# Patient Record
Sex: Female | Born: 1940 | Race: White | Hispanic: No | Smoking: Former smoker
Health system: Southern US, Community
[De-identification: ages and names within clinical notes are randomized; demographics above are authoritative.]

## PROBLEM LIST (undated history)

## (undated) DIAGNOSIS — E876 Hypokalemia: Secondary | ICD-10-CM

## (undated) DIAGNOSIS — F439 Reaction to severe stress, unspecified: Secondary | ICD-10-CM

## (undated) DIAGNOSIS — C50919 Malignant neoplasm of unspecified site of unspecified female breast: Secondary | ICD-10-CM

## (undated) DIAGNOSIS — E785 Hyperlipidemia, unspecified: Secondary | ICD-10-CM

## (undated) DIAGNOSIS — E559 Vitamin D deficiency, unspecified: Secondary | ICD-10-CM

## (undated) DIAGNOSIS — Z923 Personal history of irradiation: Secondary | ICD-10-CM

## (undated) DIAGNOSIS — Z9221 Personal history of antineoplastic chemotherapy: Secondary | ICD-10-CM

## (undated) DIAGNOSIS — M810 Age-related osteoporosis without current pathological fracture: Secondary | ICD-10-CM

## (undated) DIAGNOSIS — Z853 Personal history of malignant neoplasm of breast: Secondary | ICD-10-CM

## (undated) DIAGNOSIS — N951 Menopausal and female climacteric states: Secondary | ICD-10-CM

## (undated) DIAGNOSIS — S0990XA Unspecified injury of head, initial encounter: Secondary | ICD-10-CM

## (undated) DIAGNOSIS — H269 Unspecified cataract: Secondary | ICD-10-CM

## (undated) DIAGNOSIS — R609 Edema, unspecified: Secondary | ICD-10-CM

## (undated) DIAGNOSIS — B029 Zoster without complications: Secondary | ICD-10-CM

## (undated) DIAGNOSIS — T7840XA Allergy, unspecified, initial encounter: Secondary | ICD-10-CM

## (undated) DIAGNOSIS — I10 Essential (primary) hypertension: Secondary | ICD-10-CM

## (undated) DIAGNOSIS — C801 Malignant (primary) neoplasm, unspecified: Secondary | ICD-10-CM

## (undated) DIAGNOSIS — Z8679 Personal history of other diseases of the circulatory system: Secondary | ICD-10-CM

## (undated) DIAGNOSIS — G4700 Insomnia, unspecified: Secondary | ICD-10-CM

## (undated) DIAGNOSIS — E8881 Metabolic syndrome: Secondary | ICD-10-CM

## (undated) DIAGNOSIS — I639 Cerebral infarction, unspecified: Secondary | ICD-10-CM

## (undated) HISTORY — DX: Unspecified cataract: H26.9

## (undated) HISTORY — DX: Hypokalemia: E87.6

## (undated) HISTORY — DX: Essential (primary) hypertension: I10

## (undated) HISTORY — DX: Personal history of other diseases of the circulatory system: Z86.79

## (undated) HISTORY — DX: Vitamin D deficiency, unspecified: E55.9

## (undated) HISTORY — DX: Edema, unspecified: R60.9

## (undated) HISTORY — DX: Metabolic syndrome: E88.81

## (undated) HISTORY — DX: Hyperlipidemia, unspecified: E78.5

## (undated) HISTORY — DX: Personal history of malignant neoplasm of breast: Z85.3

## (undated) HISTORY — DX: Metabolic syndrome: E88.810

## (undated) HISTORY — PX: CATARACT EXTRACTION, BILATERAL: SHX1313

## (undated) HISTORY — DX: Age-related osteoporosis without current pathological fracture: M81.0

## (undated) HISTORY — DX: Insomnia, unspecified: G47.00

## (undated) HISTORY — DX: Malignant (primary) neoplasm, unspecified: C80.1

## (undated) HISTORY — DX: Zoster without complications: B02.9

## (undated) HISTORY — DX: Unspecified injury of head, initial encounter: S09.90XA

## (undated) HISTORY — DX: Menopausal and female climacteric states: N95.1

## (undated) HISTORY — DX: Reaction to severe stress, unspecified: F43.9

## (undated) HISTORY — DX: Personal history of irradiation: Z92.3

## (undated) HISTORY — PX: OTHER SURGICAL HISTORY: SHX169

## (undated) HISTORY — DX: Allergy, unspecified, initial encounter: T78.40XA

## (undated) HISTORY — DX: Personal history of antineoplastic chemotherapy: Z92.21

## (undated) HISTORY — PX: ABDOMINAL HYSTERECTOMY: SHX81

---

## 1989-11-01 HISTORY — PX: BREAST EXCISIONAL BIOPSY: SUR124

## 1993-11-01 DIAGNOSIS — S0990XA Unspecified injury of head, initial encounter: Secondary | ICD-10-CM

## 1993-11-01 HISTORY — DX: Unspecified injury of head, initial encounter: S09.90XA

## 2004-02-23 ENCOUNTER — Other Ambulatory Visit: Payer: Self-pay

## 2004-11-01 DIAGNOSIS — Z9221 Personal history of antineoplastic chemotherapy: Secondary | ICD-10-CM

## 2004-11-01 DIAGNOSIS — Z923 Personal history of irradiation: Secondary | ICD-10-CM

## 2004-11-01 DIAGNOSIS — C801 Malignant (primary) neoplasm, unspecified: Secondary | ICD-10-CM

## 2004-11-01 DIAGNOSIS — B029 Zoster without complications: Secondary | ICD-10-CM

## 2004-11-01 HISTORY — DX: Zoster without complications: B02.9

## 2004-11-01 HISTORY — PX: BREAST BIOPSY: SHX20

## 2004-11-01 HISTORY — DX: Personal history of irradiation: Z92.3

## 2004-11-01 HISTORY — DX: Personal history of antineoplastic chemotherapy: Z92.21

## 2004-11-01 HISTORY — PX: PORTACATH PLACEMENT: SHX2246

## 2004-11-01 HISTORY — DX: Malignant (primary) neoplasm, unspecified: C80.1

## 2004-11-01 HISTORY — PX: BREAST SURGERY: SHX581

## 2005-02-01 ENCOUNTER — Ambulatory Visit: Payer: Self-pay

## 2005-06-21 ENCOUNTER — Ambulatory Visit: Payer: Self-pay

## 2005-07-06 ENCOUNTER — Ambulatory Visit: Payer: Self-pay

## 2005-07-26 ENCOUNTER — Ambulatory Visit: Payer: Self-pay | Admitting: General Surgery

## 2005-09-01 HISTORY — PX: BREAST LUMPECTOMY: SHX2

## 2005-09-13 ENCOUNTER — Ambulatory Visit: Payer: Self-pay | Admitting: General Surgery

## 2005-09-13 ENCOUNTER — Other Ambulatory Visit: Payer: Self-pay

## 2005-09-14 ENCOUNTER — Ambulatory Visit: Payer: Self-pay | Admitting: General Surgery

## 2005-09-16 ENCOUNTER — Ambulatory Visit: Payer: Self-pay | Admitting: General Surgery

## 2005-09-16 DIAGNOSIS — C50919 Malignant neoplasm of unspecified site of unspecified female breast: Secondary | ICD-10-CM

## 2005-09-16 HISTORY — DX: Malignant neoplasm of unspecified site of unspecified female breast: C50.919

## 2005-10-07 ENCOUNTER — Ambulatory Visit: Payer: Self-pay | Admitting: Internal Medicine

## 2005-10-29 ENCOUNTER — Ambulatory Visit: Payer: Self-pay | Admitting: General Surgery

## 2005-11-01 ENCOUNTER — Ambulatory Visit: Payer: Self-pay | Admitting: Internal Medicine

## 2005-11-02 ENCOUNTER — Ambulatory Visit: Payer: Self-pay | Admitting: General Surgery

## 2005-12-02 ENCOUNTER — Ambulatory Visit: Payer: Self-pay | Admitting: Internal Medicine

## 2005-12-30 ENCOUNTER — Ambulatory Visit: Payer: Self-pay | Admitting: Internal Medicine

## 2006-01-30 ENCOUNTER — Ambulatory Visit: Payer: Self-pay | Admitting: Internal Medicine

## 2006-03-01 ENCOUNTER — Ambulatory Visit: Payer: Self-pay | Admitting: Internal Medicine

## 2006-04-01 ENCOUNTER — Ambulatory Visit: Payer: Self-pay | Admitting: Internal Medicine

## 2006-05-01 ENCOUNTER — Ambulatory Visit: Payer: Self-pay | Admitting: Internal Medicine

## 2006-06-01 ENCOUNTER — Ambulatory Visit: Payer: Self-pay | Admitting: Internal Medicine

## 2006-07-02 ENCOUNTER — Ambulatory Visit: Payer: Self-pay | Admitting: Internal Medicine

## 2006-08-01 ENCOUNTER — Ambulatory Visit: Payer: Self-pay | Admitting: Internal Medicine

## 2006-09-15 ENCOUNTER — Ambulatory Visit: Payer: Self-pay | Admitting: Internal Medicine

## 2006-10-01 ENCOUNTER — Ambulatory Visit: Payer: Self-pay | Admitting: Internal Medicine

## 2006-11-01 ENCOUNTER — Ambulatory Visit: Payer: Self-pay | Admitting: Internal Medicine

## 2006-12-02 ENCOUNTER — Ambulatory Visit: Payer: Self-pay | Admitting: Internal Medicine

## 2006-12-31 ENCOUNTER — Ambulatory Visit: Payer: Self-pay | Admitting: Internal Medicine

## 2007-01-12 ENCOUNTER — Ambulatory Visit: Payer: Self-pay | Admitting: Radiation Oncology

## 2007-01-31 ENCOUNTER — Ambulatory Visit: Payer: Self-pay | Admitting: Radiation Oncology

## 2007-01-31 ENCOUNTER — Ambulatory Visit: Payer: Self-pay | Admitting: Internal Medicine

## 2007-03-02 ENCOUNTER — Ambulatory Visit: Payer: Self-pay | Admitting: Internal Medicine

## 2007-03-25 IMAGING — US ULTRASOUND LEFT BREAST
1 series · 17 of 25 positions shown · non-contrast
Comparison: none

REASON FOR EXAM: A 2 cm.mass LEFT breast
COMMENTS:

PROCEDURE:     US  - US BREAST LEFT  - July 06, 2005 [DATE]
RESULT:     There is a 1.8 cm, ill-defined, solid mass at 7 o'clock in the
LEFT breast.  This corresponds to the mammographic abnormality and is very
suspicious for malignancy.  A biopsy of this lesion is suggested.

[Series 1: ultrasound left breast · 17 of 34 slices shown]
[im 1/34]
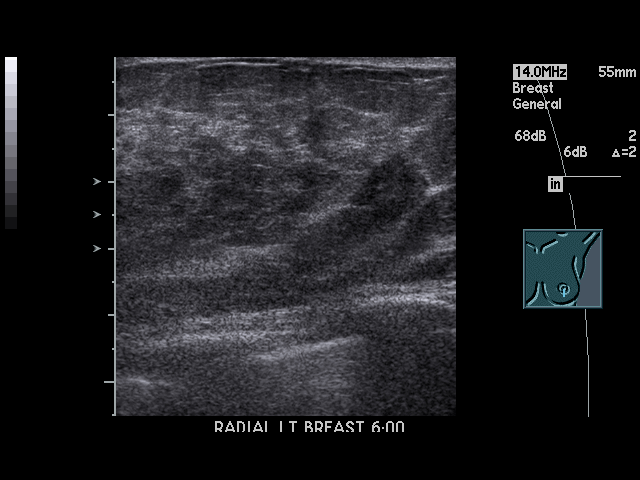
[im 3/34]
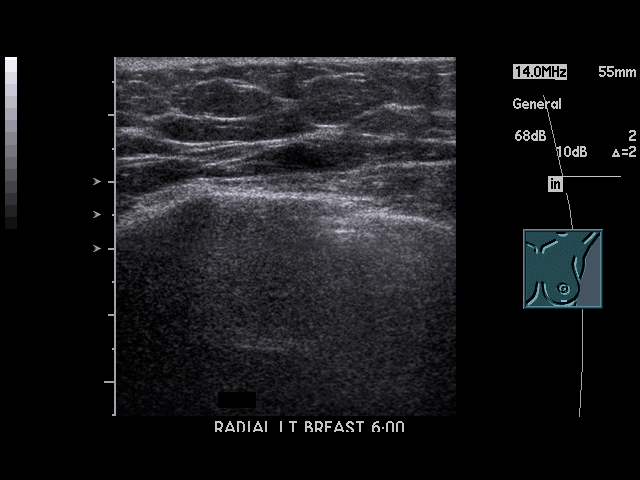
[im 5/34]
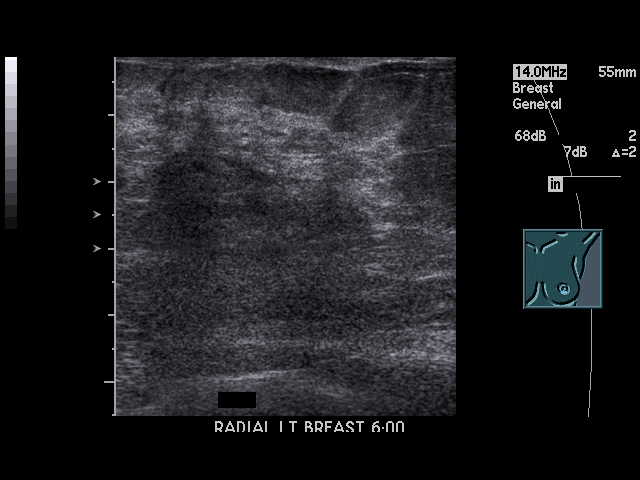
[im 7/34]
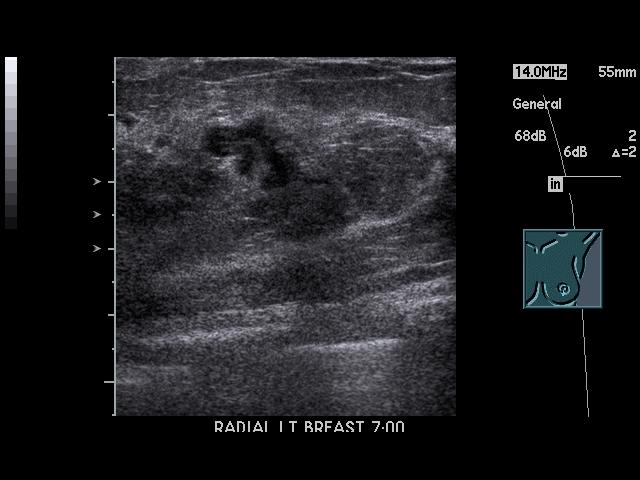
[im 9/34]
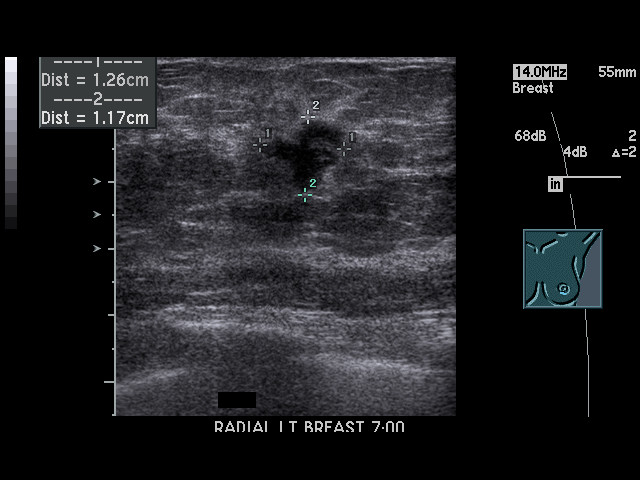
[im 12/34]
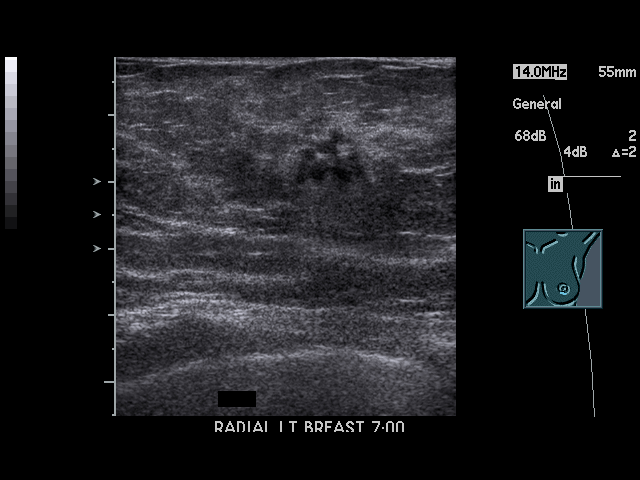
[im 13/34]
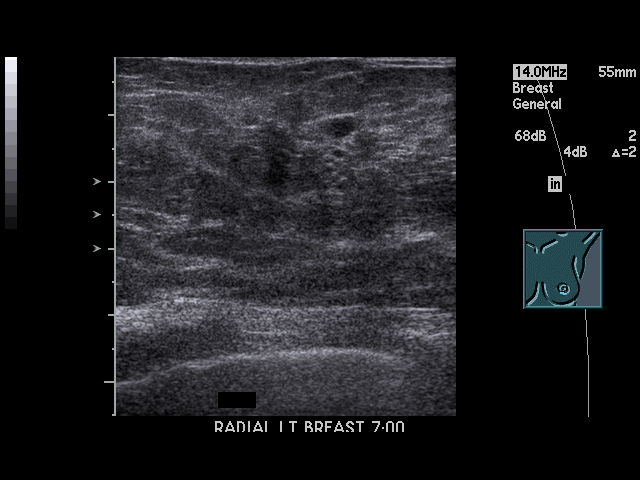
[im 16/34]
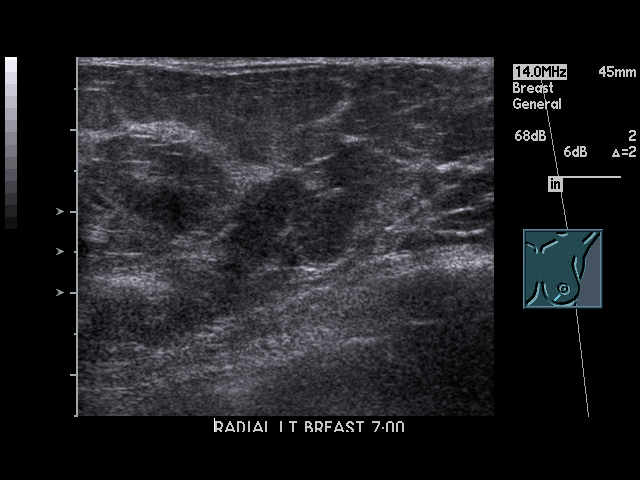
[im 17/34]
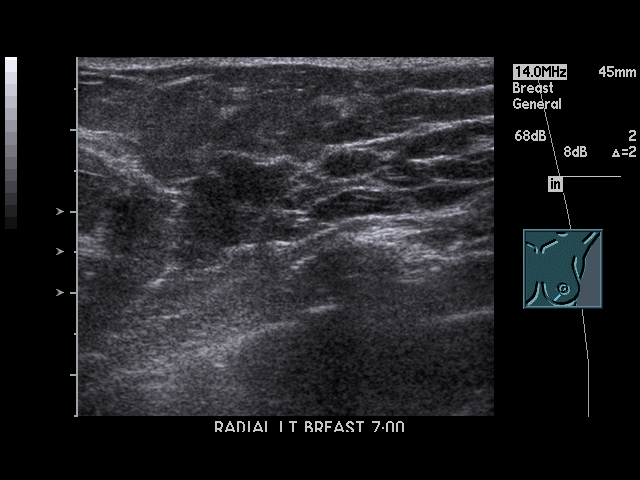
[im 18/34]
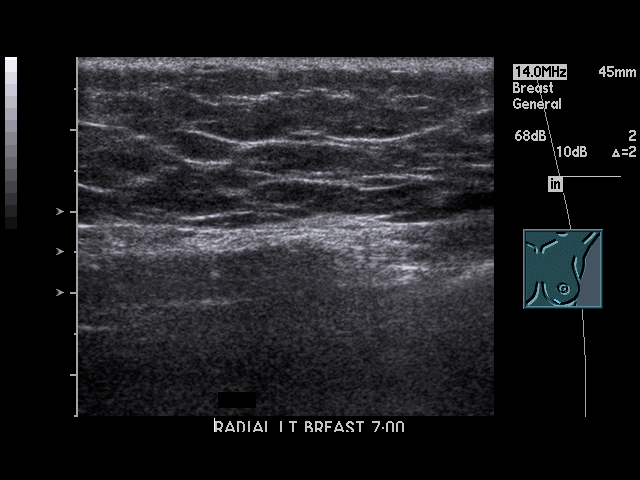
[im 21/34]
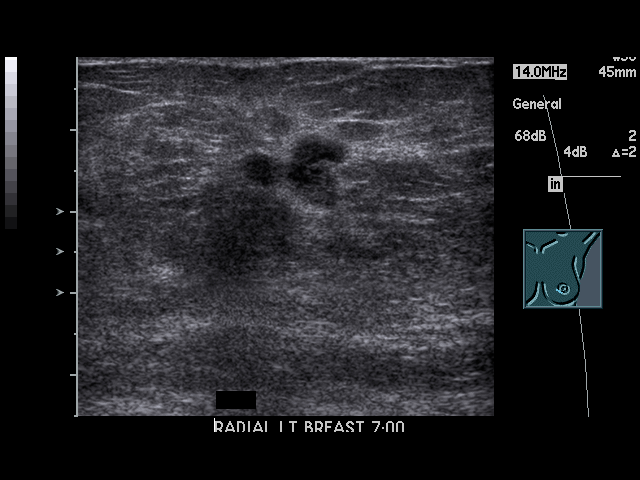
[im 23/34]
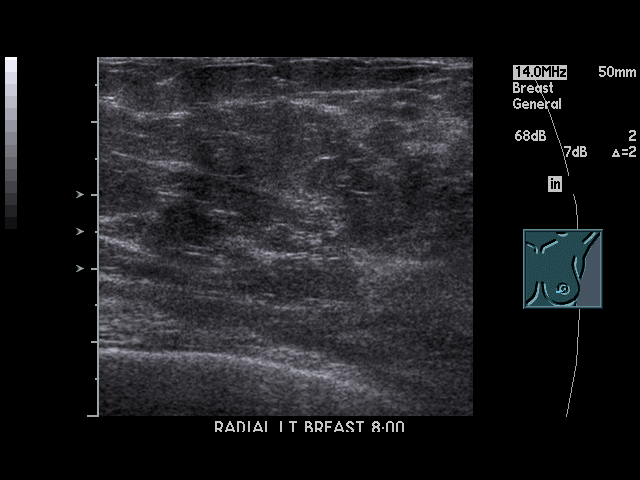
[im 25/34]
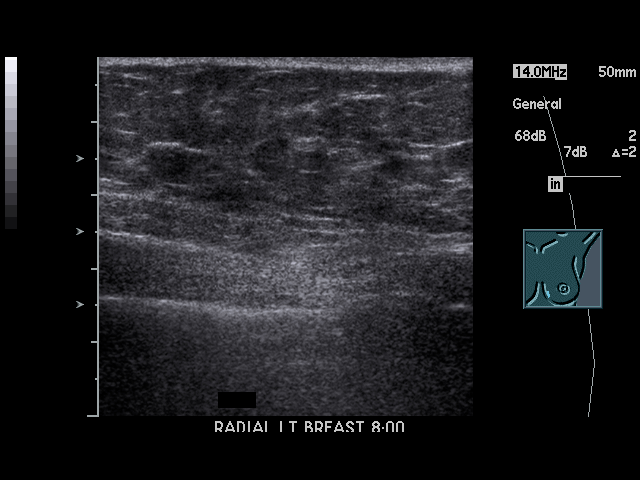
[im 27/34]
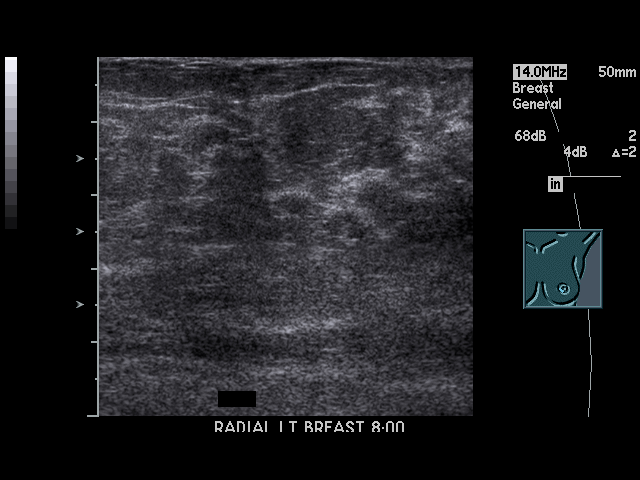
[im 29/34]
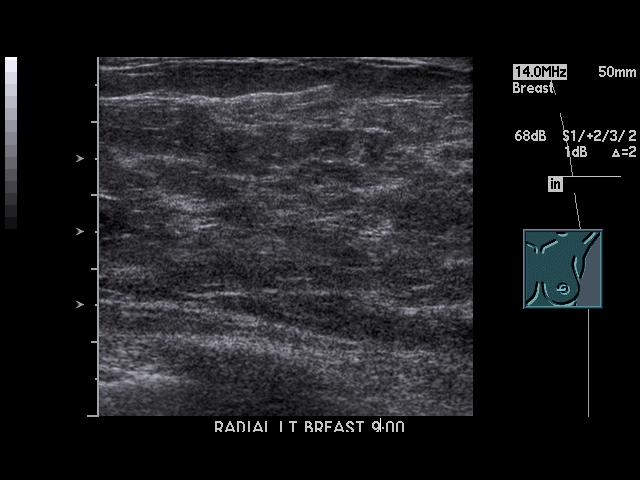
[im 31/34]
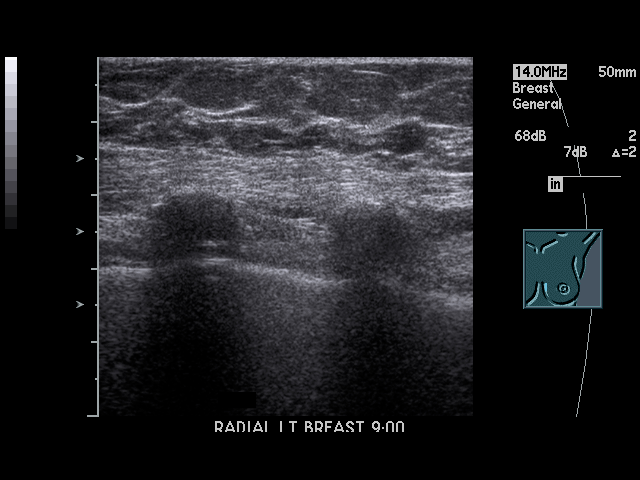
[im 34/34]
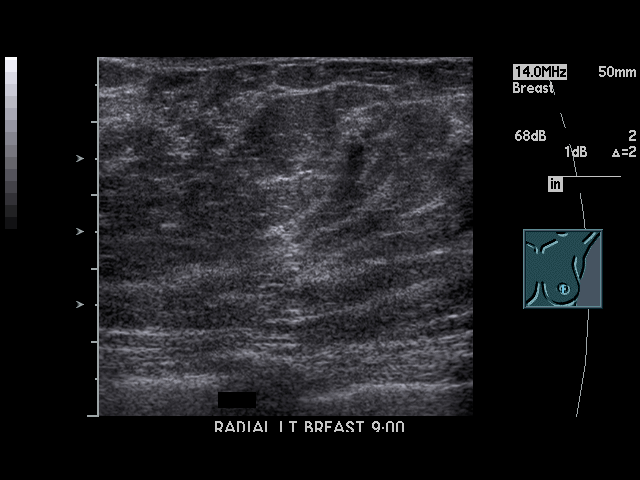

[17 of 25 positions shown; findings below may reference images not displayed]

IMPRESSION: A very suspicious, irregular, solid lesion in the
inferior aspect of the LEFT breast corresponding to the mammographic
abnormality.  This lesion is very suspicious for malignancy, and biopsy is
suggested.

BI-RADS: Category 4-Suspicious Abnormality.

## 2007-04-02 ENCOUNTER — Ambulatory Visit: Payer: Self-pay | Admitting: Internal Medicine

## 2007-05-02 ENCOUNTER — Ambulatory Visit: Payer: Self-pay | Admitting: Internal Medicine

## 2007-06-02 ENCOUNTER — Ambulatory Visit: Payer: Self-pay | Admitting: Internal Medicine

## 2007-06-02 ENCOUNTER — Ambulatory Visit: Payer: Self-pay | Admitting: Radiation Oncology

## 2007-07-03 ENCOUNTER — Ambulatory Visit: Payer: Self-pay | Admitting: Radiation Oncology

## 2007-07-03 ENCOUNTER — Ambulatory Visit: Payer: Self-pay | Admitting: Internal Medicine

## 2007-08-02 ENCOUNTER — Ambulatory Visit: Payer: Self-pay | Admitting: Internal Medicine

## 2007-09-02 ENCOUNTER — Ambulatory Visit: Payer: Self-pay | Admitting: Internal Medicine

## 2007-10-02 ENCOUNTER — Ambulatory Visit: Payer: Self-pay | Admitting: Internal Medicine

## 2007-10-11 ENCOUNTER — Ambulatory Visit: Payer: Self-pay | Admitting: General Surgery

## 2007-11-02 ENCOUNTER — Ambulatory Visit: Payer: Self-pay | Admitting: Internal Medicine

## 2007-11-02 HISTORY — PX: PORT-A-CATH REMOVAL: SHX5289

## 2008-01-31 ENCOUNTER — Ambulatory Visit: Payer: Self-pay | Admitting: Internal Medicine

## 2008-02-20 ENCOUNTER — Ambulatory Visit: Payer: Self-pay | Admitting: Internal Medicine

## 2008-03-01 ENCOUNTER — Ambulatory Visit: Payer: Self-pay | Admitting: Internal Medicine

## 2008-04-01 ENCOUNTER — Ambulatory Visit: Payer: Self-pay | Admitting: Internal Medicine

## 2008-04-08 ENCOUNTER — Ambulatory Visit: Payer: Self-pay | Admitting: General Surgery

## 2008-06-13 ENCOUNTER — Ambulatory Visit: Payer: Self-pay | Admitting: Radiation Oncology

## 2008-06-13 ENCOUNTER — Ambulatory Visit: Payer: Self-pay | Admitting: Internal Medicine

## 2008-07-02 ENCOUNTER — Ambulatory Visit: Payer: Self-pay | Admitting: Radiation Oncology

## 2008-08-01 ENCOUNTER — Ambulatory Visit: Payer: Self-pay | Admitting: Internal Medicine

## 2008-08-21 ENCOUNTER — Ambulatory Visit: Payer: Self-pay | Admitting: Internal Medicine

## 2008-09-01 ENCOUNTER — Ambulatory Visit: Payer: Self-pay | Admitting: Internal Medicine

## 2008-09-09 ENCOUNTER — Ambulatory Visit: Payer: Self-pay | Admitting: General Surgery

## 2009-01-30 ENCOUNTER — Ambulatory Visit: Payer: Self-pay | Admitting: Internal Medicine

## 2009-02-17 ENCOUNTER — Ambulatory Visit: Payer: Self-pay | Admitting: Internal Medicine

## 2009-03-01 ENCOUNTER — Ambulatory Visit: Payer: Self-pay | Admitting: Internal Medicine

## 2009-04-18 ENCOUNTER — Ambulatory Visit: Payer: Self-pay | Admitting: Internal Medicine

## 2009-05-01 ENCOUNTER — Ambulatory Visit: Payer: Self-pay | Admitting: Internal Medicine

## 2009-06-01 ENCOUNTER — Ambulatory Visit: Payer: Self-pay | Admitting: Radiation Oncology

## 2009-06-12 ENCOUNTER — Ambulatory Visit: Payer: Self-pay | Admitting: Internal Medicine

## 2009-07-02 ENCOUNTER — Ambulatory Visit: Payer: Self-pay | Admitting: Radiation Oncology

## 2009-08-01 ENCOUNTER — Ambulatory Visit: Payer: Self-pay | Admitting: Internal Medicine

## 2009-08-18 ENCOUNTER — Ambulatory Visit: Payer: Self-pay | Admitting: Internal Medicine

## 2009-09-01 ENCOUNTER — Ambulatory Visit: Payer: Self-pay | Admitting: Internal Medicine

## 2009-09-15 ENCOUNTER — Ambulatory Visit: Payer: Self-pay | Admitting: General Surgery

## 2010-05-01 ENCOUNTER — Ambulatory Visit: Payer: Self-pay | Admitting: Internal Medicine

## 2010-05-11 ENCOUNTER — Ambulatory Visit: Payer: Self-pay | Admitting: Internal Medicine

## 2010-05-12 LAB — CANCER ANTIGEN 27.29: CA 27.29: 15.7 U/mL (ref 0.0–38.6)

## 2010-06-01 ENCOUNTER — Ambulatory Visit: Payer: Self-pay | Admitting: Internal Medicine

## 2010-07-02 ENCOUNTER — Ambulatory Visit: Payer: Self-pay | Admitting: Internal Medicine

## 2010-09-17 ENCOUNTER — Ambulatory Visit: Payer: Self-pay | Admitting: Internal Medicine

## 2011-05-14 ENCOUNTER — Ambulatory Visit: Payer: Self-pay | Admitting: Internal Medicine

## 2011-06-02 ENCOUNTER — Ambulatory Visit: Payer: Self-pay | Admitting: Internal Medicine

## 2011-08-12 ENCOUNTER — Ambulatory Visit: Payer: Self-pay | Admitting: Internal Medicine

## 2011-09-02 ENCOUNTER — Ambulatory Visit: Payer: Self-pay | Admitting: Internal Medicine

## 2011-10-02 ENCOUNTER — Ambulatory Visit: Payer: Self-pay | Admitting: Internal Medicine

## 2012-05-24 ENCOUNTER — Ambulatory Visit: Payer: Self-pay | Admitting: Internal Medicine

## 2012-05-24 LAB — CBC CANCER CENTER
Basophil #: 0.1 x10 3/mm (ref 0.0–0.1)
Eosinophil #: 0.1 x10 3/mm (ref 0.0–0.7)
HCT: 42.1 % (ref 35.0–47.0)
Lymphocyte %: 33.2 %
MCHC: 33.1 g/dL (ref 32.0–36.0)
Monocyte %: 7.7 %
Neutrophil #: 4.1 x10 3/mm (ref 1.4–6.5)
Neutrophil %: 56.8 %
Platelet: 165 x10 3/mm (ref 150–440)
RBC: 4.22 10*6/uL (ref 3.80–5.20)
RDW: 12 % (ref 11.5–14.5)
WBC: 7.2 x10 3/mm (ref 3.6–11.0)

## 2012-05-24 LAB — HEPATIC FUNCTION PANEL A (ARMC)
Alkaline Phosphatase: 133 U/L (ref 50–136)
Bilirubin,Total: 0.4 mg/dL (ref 0.2–1.0)
SGOT(AST): 31 U/L (ref 15–37)
SGPT (ALT): 39 U/L
Total Protein: 7.6 g/dL (ref 6.4–8.2)

## 2012-05-24 LAB — CREATININE, SERUM: EGFR (Non-African Amer.): 56 — ABNORMAL LOW

## 2012-05-25 LAB — CANCER ANTIGEN 27.29: CA 27.29: 19.1 U/mL (ref 0.0–38.6)

## 2012-06-01 ENCOUNTER — Ambulatory Visit: Payer: Self-pay | Admitting: Internal Medicine

## 2012-09-21 ENCOUNTER — Ambulatory Visit: Payer: Self-pay | Admitting: Internal Medicine

## 2013-06-01 ENCOUNTER — Ambulatory Visit: Payer: Self-pay | Admitting: Internal Medicine

## 2013-06-01 LAB — CBC CANCER CENTER
Basophil %: 0.8 %
Eosinophil %: 2 %
HCT: 37.2 % (ref 35.0–47.0)
HGB: 13.3 g/dL (ref 12.0–16.0)
Lymphocyte %: 36 %
MCHC: 35.8 g/dL (ref 32.0–36.0)
MCV: 96 fL (ref 80–100)
Monocyte #: 0.7 x10 3/mm (ref 0.2–0.9)
Neutrophil #: 4 x10 3/mm (ref 1.4–6.5)
Platelet: 140 x10 3/mm — ABNORMAL LOW (ref 150–440)
RBC: 3.88 10*6/uL (ref 3.80–5.20)
RDW: 12.8 % (ref 11.5–14.5)
WBC: 7.6 x10 3/mm (ref 3.6–11.0)

## 2013-06-01 LAB — CREATININE, SERUM
Creatinine: 0.9 mg/dL (ref 0.60–1.30)
EGFR (Non-African Amer.): >60

## 2013-06-11 ENCOUNTER — Ambulatory Visit: Payer: Self-pay | Admitting: Family Medicine

## 2013-06-11 LAB — HM DEXA SCAN

## 2013-07-02 ENCOUNTER — Ambulatory Visit: Payer: Self-pay | Admitting: Internal Medicine

## 2013-09-25 ENCOUNTER — Ambulatory Visit: Payer: Self-pay | Admitting: Internal Medicine

## 2014-06-17 ENCOUNTER — Ambulatory Visit: Payer: Self-pay | Admitting: Internal Medicine

## 2014-06-17 LAB — HEPATIC FUNCTION PANEL A (ARMC)
ALT: 33 U/L
Albumin: 4 g/dL (ref 3.4–5.0)
Alkaline Phosphatase: 106 U/L
Bilirubin, Direct: <0.1 mg/dL (ref 0.00–0.20)
Bilirubin,Total: 0.4 mg/dL (ref 0.2–1.0)
SGOT(AST): 28 U/L (ref 15–37)
TOTAL PROTEIN: 7 g/dL (ref 6.4–8.2)

## 2014-06-17 LAB — CBC CANCER CENTER
BASOS PCT: 0.8 %
Basophil #: 0.1 x10 3/mm (ref 0.0–0.1)
Eosinophil #: 0.1 x10 3/mm (ref 0.0–0.7)
Eosinophil %: 0.9 %
HCT: 41 % (ref 35.0–47.0)
HGB: 13.5 g/dL (ref 12.0–16.0)
LYMPHS ABS: 2 x10 3/mm (ref 1.0–3.6)
Lymphocyte %: 25.8 %
MCH: 33.1 pg (ref 26.0–34.0)
MCHC: 33 g/dL (ref 32.0–36.0)
MCV: 100 fL (ref 80–100)
Monocyte #: 0.6 x10 3/mm (ref 0.2–0.9)
Monocyte %: 8 %
NEUTROS ABS: 4.9 x10 3/mm (ref 1.4–6.5)
Neutrophil %: 64.5 %
PLATELETS: 149 x10 3/mm — AB (ref 150–440)
RBC: 4.09 10*6/uL (ref 3.80–5.20)
RDW: 12.7 % (ref 11.5–14.5)
WBC: 7.6 x10 3/mm (ref 3.6–11.0)

## 2014-06-17 LAB — CREATININE, SERUM
Creatinine: 0.97 mg/dL (ref 0.60–1.30)
EGFR (African American): >60
EGFR (Non-African Amer.): 58 — ABNORMAL LOW

## 2014-06-18 LAB — CANCER ANTIGEN 27.29: CA 27.29: 20.9 U/mL (ref 0.0–38.6)

## 2014-06-19 LAB — HEMOGLOBIN A1C: HEMOGLOBIN A1C: 6.1 % — AB (ref 4.0–6.0)

## 2014-06-19 LAB — LIPID PANEL
Cholesterol: 140 mg/dL (ref 0–200)
HDL: 57 mg/dL (ref 35–70)
LDL Cholesterol: 54 mg/dL
Triglycerides: 145 mg/dL (ref 40–160)

## 2014-07-02 ENCOUNTER — Ambulatory Visit: Payer: Self-pay | Admitting: Internal Medicine

## 2014-08-12 LAB — HM COLONOSCOPY

## 2014-10-07 ENCOUNTER — Ambulatory Visit: Payer: Self-pay | Admitting: Internal Medicine

## 2014-10-07 LAB — HM MAMMOGRAPHY: HM MAMMO: NEGATIVE

## 2014-10-14 ENCOUNTER — Ambulatory Visit: Payer: Self-pay | Admitting: Internal Medicine

## 2014-10-15 ENCOUNTER — Encounter: Payer: Self-pay | Admitting: General Surgery

## 2014-10-15 ENCOUNTER — Ambulatory Visit: Payer: Self-pay | Admitting: General Surgery

## 2014-10-21 ENCOUNTER — Other Ambulatory Visit: Payer: Commercial Managed Care - HMO

## 2014-10-21 ENCOUNTER — Encounter: Payer: Self-pay | Admitting: General Surgery

## 2014-10-21 ENCOUNTER — Ambulatory Visit (INDEPENDENT_AMBULATORY_CARE_PROVIDER_SITE_OTHER): Payer: Commercial Managed Care - HMO | Admitting: General Surgery

## 2014-10-21 VITALS — BP 110/60 | HR 72 | Resp 12 | Ht 63.5 in | Wt 155.0 lb

## 2014-10-21 DIAGNOSIS — R928 Other abnormal and inconclusive findings on diagnostic imaging of breast: Secondary | ICD-10-CM

## 2014-10-21 NOTE — Patient Instructions (Signed)
Patient to return in 1 week to see nurse. The patient is aware to call back for any questions or concerns.     CARE AFTER BREAST BIOPSY  1. Leave the dressing on that your doctor applied after surgery. It is waterproof. You may bathe, shower and/or swim. The dressing will probably remain intact until your return office visit. If the dressing comes off, you will see small strips of tape against your skin on the incision. Do not remove these strips.  2. You may want to use a gauze,cloth or similar protection in your bra to prevent rubbing against your dressing and incision. This is not necessary, but you may feel more comfortable doing so.  3. It is recommended that you wear a bra day and night to give support to the breast. This will prevent the weight of the breast from pulling on the incision.  4. Your breast will feel hard and lumpy under the incision. Do not be alarmed. This is the underlying stitching of tissue. Softening of this tissue will occur in time.  5. Make sure you call the office and schedule an appointment in one week after your surgery. The office phone number is [phone]. The nurses at [facility] may have already done this for you.  6. You will notice about a week after your office visit that the strips of the tape on your incision will begin to loosen. These may then be removed.  7. Report to your doctor any of the following:  * Severe pain not relieved by your pain medication  *Redness of the incision  * Drainage from the incision  *Fever greater than 101 degrees

## 2014-10-21 NOTE — Progress Notes (Signed)
Patient ID: Leslie Patel, female   DOB: 12-Oct-1941, 73 y.o.   MRN: [id number]  Chief Complaint  Patient presents with  . Other    mammogram    HPI Leslie Patel is a 73 y.o. female who presents for a breast evaluation. The most recent mammogram was done on 10/07/14. Additional views and right breast mammogram were done on 10/14/14.  Patient does perform regular self breast checks and gets regular mammograms done. The patient has a history of left breast breast cancer diagnosed and treated in 2006. The patient denies any problems with her breasts at this time.    HPI  Past Medical History  Diagnosis Date  . Shingles 2006  . Low blood potassium   . Head injury 1995  . History of radiation therapy 2006  . History of chemotherapy 2006  . Cancer 2006    left breast    Past Surgical History  Procedure Laterality Date  . Port-a-cath removal  11/2007  . Breast biopsy Left 1991,2006  . Breast surgery Left 2006    lumpectomy  . Portacath placement  2006    Family History  Problem Relation Age of Onset  . Tuberculosis Mother   . Cancer Father     leukemia  . Other Brother     spinal meningitis    Social History History  Substance Use Topics  . Smoking status: Former Research scientist (life sciences)  . Smokeless tobacco: Never Used  . Alcohol Use: No    Allergies  Allergen Reactions  . Darvon [Propoxyphene] Anaphylaxis  . Loratadine Anaphylaxis  . Percocet [Oxycodone-Acetaminophen] Itching, Rash and Other (See Comments)    wheezing    Current Outpatient Prescriptions  Medication Sig Dispense Refill  . furosemide (LASIX) 20 MG tablet Take 20 mg by mouth daily.    . potassium chloride SA (K-DUR,KLOR-CON) 20 MEQ tablet Take 20 mEq by mouth 4 (four) times daily.    . simvastatin (ZOCOR) 40 MG tablet Take 40 mg by mouth daily.    Marland Kitchen spironolactone (ALDACTONE) 100 MG tablet Take 100 mg by mouth daily.     No current facility-administered medications for this visit.    Review of  Systems Review of Systems  Blood pressure 110/60, pulse 72, resp. rate 12, height 5' 3.5" (1.613 m), weight 155 lb (70.308 kg).  Physical Exam Physical Exam  Constitutional: She is oriented to person, place, and time. She appears well-developed and well-nourished.  Neck: Neck supple. No thyromegaly present.  Cardiovascular: Normal rate, regular rhythm and normal heart sounds.   No murmur heard. Pulmonary/Chest: Effort normal and breath sounds normal. Right breast exhibits no inverted nipple, no mass, no nipple discharge, no skin change and no tenderness. Left breast exhibits no inverted nipple, no mass, no nipple discharge, no skin change and no tenderness.  Well healed incision 6 o'clock left breast.    Lymphadenopathy:    She has no cervical adenopathy.    She has no axillary adenopathy.  Neurological: She is alert and oriented to person, place, and time.  Skin: Skin is warm and dry.    Data Reviewed Screening mammogram dated 10/07/2014 suggested a new density in the inferior medial aspect of the right breast. BI-RADS-0.  Focal spot compression views dated 10/14/2014 showed a persistent abnormality with ultrasound showing a 3 x 7 x 9 mm area within the right breast at the 7:00 position 3 cm from the nipple. RN-4.  Ultrasound examination of the right breast at the 6:00 position (without arm extended  over the shoulder), 3 cm from the nipple showed a 0.3 x 0.7 x 0.7 cm slightly lenticular shaped hypoechoic area. This corresponded to the [facility] study. BI-RADS-3.  The patient was amenable to biopsy.  10 mL of 0.5% Xylocaine with 0.25% Marcaine with 1-200,000 units epinephrine was visualized well tolerated. ChloraPrep was applied to the skin. A 14-gauge spring-loaded device was used and multiple core samples obtained. A postbiopsy clip placed. Skin defect closed with benzoin and Steri-Strips followed by Telfa and Tegaderm dressing.  The procedure was well tolerated.  Assessment    New  right breast mass.    Plan    The patient will be contacted with pathology is available. Assuming benign results arrangements were made for nursing recheck in 1 week.     PCP:  Maryanna Shape 10/21/2014, 9:19 PM

## 2014-10-22 ENCOUNTER — Telehealth: Payer: Self-pay | Admitting: *Deleted

## 2014-10-22 ENCOUNTER — Encounter: Payer: Self-pay | Admitting: General Surgery

## 2014-10-22 HISTORY — PX: BREAST BIOPSY: SHX20

## 2014-10-22 NOTE — Telephone Encounter (Signed)
Notified patient as instructed, patient extremely pleased. Discussed follow-up appointments, patient agrees.

## 2014-10-28 ENCOUNTER — Ambulatory Visit (INDEPENDENT_AMBULATORY_CARE_PROVIDER_SITE_OTHER): Payer: Self-pay | Admitting: *Deleted

## 2014-10-28 DIAGNOSIS — R928 Other abnormal and inconclusive findings on diagnostic imaging of breast: Secondary | ICD-10-CM

## 2014-10-28 NOTE — Patient Instructions (Signed)
The patient is aware to call back for any questions or concerns.  

## 2014-10-28 NOTE — Progress Notes (Signed)
Patient here today for follow up post breast biopsy.  Dressing removed, steristrip in place and aware it may come off in one week.  Minimal bruising noted.  The patient is aware that a heating pad may be used for comfort as needed.  Aware of pathology. Follow up as scheduled.

## 2014-12-19 ENCOUNTER — Encounter: Payer: Self-pay | Admitting: General Surgery

## 2015-01-07 ENCOUNTER — Encounter: Payer: Self-pay | Admitting: General Surgery

## 2015-03-10 ENCOUNTER — Other Ambulatory Visit: Payer: Self-pay

## 2015-03-10 DIAGNOSIS — R928 Other abnormal and inconclusive findings on diagnostic imaging of breast: Secondary | ICD-10-CM

## 2015-03-13 ENCOUNTER — Other Ambulatory Visit: Payer: Self-pay

## 2015-03-13 DIAGNOSIS — R928 Other abnormal and inconclusive findings on diagnostic imaging of breast: Secondary | ICD-10-CM

## 2015-03-27 ENCOUNTER — Ambulatory Visit: Payer: Self-pay

## 2015-03-27 ENCOUNTER — Other Ambulatory Visit: Payer: Self-pay

## 2015-04-09 ENCOUNTER — Other Ambulatory Visit: Payer: Self-pay | Admitting: General Surgery

## 2015-04-09 ENCOUNTER — Ambulatory Visit
Admission: RE | Admit: 2015-04-09 | Discharge: 2015-04-09 | Disposition: A | Discharge status: Home / Self Care | Payer: Commercial Managed Care - HMO | Source: Ambulatory Visit | Attending: General Surgery | Admitting: General Surgery

## 2015-04-09 DIAGNOSIS — N63 Unspecified lump in breast: Secondary | ICD-10-CM | POA: Insufficient documentation

## 2015-04-09 DIAGNOSIS — R928 Other abnormal and inconclusive findings on diagnostic imaging of breast: Secondary | ICD-10-CM | POA: Diagnosis present

## 2015-04-09 HISTORY — DX: Malignant neoplasm of unspecified site of unspecified female breast: C50.919

## 2015-04-17 ENCOUNTER — Ambulatory Visit (INDEPENDENT_AMBULATORY_CARE_PROVIDER_SITE_OTHER): Payer: Commercial Managed Care - HMO | Admitting: General Surgery

## 2015-04-17 ENCOUNTER — Encounter: Payer: Self-pay | Admitting: General Surgery

## 2015-04-17 VITALS — BP 120/58 | HR 88 | Resp 12 | Ht 63.5 in | Wt 163.0 lb

## 2015-04-17 DIAGNOSIS — R928 Other abnormal and inconclusive findings on diagnostic imaging of breast: Secondary | ICD-10-CM | POA: Diagnosis not present

## 2015-04-17 NOTE — Progress Notes (Signed)
Patient ID: Leslie Patel, female   DOB: August 27, 1941, 74 y.o.   MRN: [id number]  Chief Complaint  Patient presents with  . Follow-up    mammogram    HPI Leslie Patel is a 74 y.o. female.  who presents for her 6 month breast evaluation. The most recent mammogram and ultrasound was done on 04-09-15.  Patient does perform regular self breast checks and gets regular mammograms done.  No new breast issues.  HPI  Past Medical History  Diagnosis Date  . Shingles 2006  . Low blood potassium   . Head injury 1995  . History of radiation therapy 2006  . History of chemotherapy 2006  . Cancer 2006    left breast with radiation and chemo  . Breast cancer 09/16/2005    Left breast, 2.1 cm histologic grade 2, pT2,N1 (mic),M0. ER 90%, PR 70%, HER-2/neu not amplified.    Past Surgical History  Procedure Laterality Date  . Port-a-cath removal  11/2007  . Portacath placement  2006  . Breast surgery Left 2006    lumpectomy  . Breast biopsy Left 1991,2006  . Breast biopsy Right 10/22/2014    Fibrocystic changes, pseudo-angiomatous stromal hyperplasia.    Family History  Problem Relation Age of Onset  . Tuberculosis Mother   . Cancer Father     leukemia  . Other Brother     spinal meningitis    Social History History  Substance Use Topics  . Smoking status: Former Research scientist (life sciences)  . Smokeless tobacco: Never Used  . Alcohol Use: No    Allergies  Allergen Reactions  . Darvon [Propoxyphene] Anaphylaxis  . Loratadine Anaphylaxis  . Percocet [Oxycodone-Acetaminophen] Itching, Rash and Other (See Comments)    wheezing    Current Outpatient Prescriptions  Medication Sig Dispense Refill  . furosemide (LASIX) 20 MG tablet Take 20 mg by mouth daily.    Marland Kitchen latanoprost (XALATAN) 0.005 % ophthalmic solution Place 1 drop into both eyes at bedtime.    . potassium chloride SA (K-DUR,KLOR-CON) 20 MEQ tablet Take 20 mEq by mouth 4 (four) times daily.    . simvastatin (ZOCOR) 40 MG tablet Take 40 mg  by mouth daily.    Marland Kitchen spironolactone (ALDACTONE) 100 MG tablet Take 100 mg by mouth daily.     No current facility-administered medications for this visit.    Review of Systems Review of Systems  Constitutional: Negative.   Respiratory: Negative.   Cardiovascular: Negative.     Blood pressure 120/58, pulse 88, resp. rate 12, height 5' 3.5" (1.613 m), weight 163 lb (73.936 kg).  Physical Exam Physical Exam  Constitutional: She is oriented to person, place, and time. She appears well-developed and well-nourished.  HENT:  Mouth/Throat: Mucous membranes are normal.  Eyes: Conjunctivae are normal. No scleral icterus.  Neck: Neck supple.  Cardiovascular: Normal rate, regular rhythm and normal heart sounds.   No lower leg edema  Pulmonary/Chest: Effort normal and breath sounds normal. Right breast exhibits no inverted nipple, no mass, no nipple discharge, no skin change and no tenderness. Left breast exhibits no inverted nipple, no mass, no nipple discharge, no skin change and no tenderness.  Left breast well healed incision at 6 o'clock.  Lymphadenopathy:    She has no cervical adenopathy.    She has no axillary adenopathy.  Neurological: She is alert and oriented to person, place, and time.  Skin: Skin is warm and dry.    Data Reviewed 04/09/2015 mammogram and ultrasound images independently reviewed. No  significant change in the previously biopsied area noted in the lower inner quadrant of the right breast.  Assessment    Benign breast exam.    Plan    Final follow-up in 6 months with bilateral mammograms at that time.  The patient is a candidate for screening colonoscopy which she has avoided successfully for many years. She wants to defer discussion until her follow-up visit.     Patient will be asked to return to the office in 6 months with a bilateral screening mammogram. Will discuss colonoscopy at next office visit.   PCP:  Frazier Butt 04/18/2015, 4:57 PM

## 2015-04-17 NOTE — Patient Instructions (Signed)
Continue self breast exams. Call office for any new breast issues or concerns. 

## 2015-04-18 ENCOUNTER — Encounter: Payer: Self-pay | Admitting: General Surgery

## 2015-05-27 ENCOUNTER — Other Ambulatory Visit: Payer: Self-pay | Admitting: Family Medicine

## 2015-05-27 ENCOUNTER — Encounter: Payer: Self-pay | Admitting: General Surgery

## 2015-06-18 ENCOUNTER — Inpatient Hospital Stay: Payer: Commercial Managed Care - HMO

## 2015-06-18 ENCOUNTER — Inpatient Hospital Stay: Payer: Commercial Managed Care - HMO | Admitting: Internal Medicine

## 2015-06-19 ENCOUNTER — Inpatient Hospital Stay: Payer: Commercial Managed Care - HMO | Attending: Internal Medicine

## 2015-06-19 ENCOUNTER — Inpatient Hospital Stay (HOSPITAL_BASED_OUTPATIENT_CLINIC_OR_DEPARTMENT_OTHER): Payer: Commercial Managed Care - HMO | Admitting: Internal Medicine

## 2015-06-19 ENCOUNTER — Other Ambulatory Visit: Payer: Self-pay | Admitting: *Deleted

## 2015-06-19 VITALS — BP 119/69 | HR 103 | Temp 98.4°F | Resp 16 | Wt 164.4 lb

## 2015-06-19 DIAGNOSIS — C50912 Malignant neoplasm of unspecified site of left female breast: Secondary | ICD-10-CM

## 2015-06-19 DIAGNOSIS — Z9221 Personal history of antineoplastic chemotherapy: Secondary | ICD-10-CM | POA: Diagnosis not present

## 2015-06-19 DIAGNOSIS — Z87891 Personal history of nicotine dependence: Secondary | ICD-10-CM

## 2015-06-19 DIAGNOSIS — Z923 Personal history of irradiation: Secondary | ICD-10-CM | POA: Diagnosis not present

## 2015-06-19 DIAGNOSIS — Z806 Family history of leukemia: Secondary | ICD-10-CM | POA: Insufficient documentation

## 2015-06-19 DIAGNOSIS — M818 Other osteoporosis without current pathological fracture: Secondary | ICD-10-CM

## 2015-06-19 DIAGNOSIS — E559 Vitamin D deficiency, unspecified: Secondary | ICD-10-CM | POA: Diagnosis not present

## 2015-06-19 DIAGNOSIS — E8881 Metabolic syndrome: Secondary | ICD-10-CM

## 2015-06-19 DIAGNOSIS — E876 Hypokalemia: Secondary | ICD-10-CM | POA: Insufficient documentation

## 2015-06-19 DIAGNOSIS — E785 Hyperlipidemia, unspecified: Secondary | ICD-10-CM

## 2015-06-19 DIAGNOSIS — D696 Thrombocytopenia, unspecified: Secondary | ICD-10-CM | POA: Diagnosis not present

## 2015-06-19 DIAGNOSIS — I10 Essential (primary) hypertension: Secondary | ICD-10-CM

## 2015-06-19 DIAGNOSIS — Z803 Family history of malignant neoplasm of breast: Secondary | ICD-10-CM | POA: Diagnosis not present

## 2015-06-19 DIAGNOSIS — Z853 Personal history of malignant neoplasm of breast: Secondary | ICD-10-CM

## 2015-06-19 LAB — CREATININE, SERUM
Creatinine, Ser: 0.95 mg/dL (ref 0.44–1.00)
GFR calc non Af Amer: 57 mL/min — ABNORMAL LOW (ref >60)

## 2015-06-19 LAB — CBC WITH DIFFERENTIAL/PLATELET
Basophils Absolute: 0.1 10*3/uL (ref 0–0.1)
Basophils Relative: 1 %
Eosinophils Absolute: 0.2 10*3/uL (ref 0–0.7)
Eosinophils Relative: 2 %
HEMATOCRIT: 44.6 % (ref 35.0–47.0)
HEMOGLOBIN: 15 g/dL (ref 12.0–16.0)
Lymphocytes Relative: 30 %
Lymphs Abs: 2.2 10*3/uL (ref 1.0–3.6)
MCH: 33.1 pg (ref 26.0–34.0)
MCHC: 33.7 g/dL (ref 32.0–36.0)
MCV: 98.2 fL (ref 80.0–100.0)
Monocytes Absolute: 0.6 10*3/uL (ref 0.2–0.9)
Monocytes Relative: 8 %
NEUTROS ABS: 4.4 10*3/uL (ref 1.4–6.5)
NEUTROS PCT: 59 %
Platelets: 164 10*3/uL (ref 150–440)
RBC: 4.54 MIL/uL (ref 3.80–5.20)
RDW: 12.3 % (ref 11.5–14.5)
WBC: 7.4 10*3/uL (ref 3.6–11.0)

## 2015-06-19 LAB — HEPATIC FUNCTION PANEL
ALT: 30 U/L (ref 14–54)
AST: 37 U/L (ref 15–41)
Albumin: 4.5 g/dL (ref 3.5–5.0)
Alkaline Phosphatase: 97 U/L (ref 38–126)
Bilirubin, Direct: 0.1 mg/dL (ref 0.1–0.5)
Indirect Bilirubin: 0.6 mg/dL (ref 0.3–0.9)
Total Bilirubin: 0.7 mg/dL (ref 0.3–1.2)
Total Protein: 7.4 g/dL (ref 6.5–8.1)

## 2015-06-20 LAB — CA 27.29 (SERIAL MONITOR): CA 27.29: 21.3 U/mL (ref 0.0–38.6)

## 2015-07-04 ENCOUNTER — Encounter: Payer: Self-pay | Admitting: Family Medicine

## 2015-07-04 DIAGNOSIS — Z8679 Personal history of other diseases of the circulatory system: Secondary | ICD-10-CM | POA: Insufficient documentation

## 2015-07-04 DIAGNOSIS — J302 Other seasonal allergic rhinitis: Secondary | ICD-10-CM | POA: Insufficient documentation

## 2015-07-04 DIAGNOSIS — G4700 Insomnia, unspecified: Secondary | ICD-10-CM | POA: Insufficient documentation

## 2015-07-04 DIAGNOSIS — I10 Essential (primary) hypertension: Secondary | ICD-10-CM | POA: Insufficient documentation

## 2015-07-04 DIAGNOSIS — E559 Vitamin D deficiency, unspecified: Secondary | ICD-10-CM | POA: Insufficient documentation

## 2015-07-04 DIAGNOSIS — E8881 Metabolic syndrome: Secondary | ICD-10-CM | POA: Insufficient documentation

## 2015-07-04 DIAGNOSIS — M810 Age-related osteoporosis without current pathological fracture: Secondary | ICD-10-CM | POA: Insufficient documentation

## 2015-07-04 DIAGNOSIS — Z720 Tobacco use: Secondary | ICD-10-CM | POA: Insufficient documentation

## 2015-07-04 DIAGNOSIS — E785 Hyperlipidemia, unspecified: Secondary | ICD-10-CM | POA: Insufficient documentation

## 2015-07-04 DIAGNOSIS — Z853 Personal history of malignant neoplasm of breast: Secondary | ICD-10-CM | POA: Insufficient documentation

## 2015-07-06 NOTE — Progress Notes (Signed)
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 She underwent lumpectomy and sentinel node study on September 16, 2005.  HER-2/neu negative. ER/PR positive.  1/2 sentinel nodes showed focus of micro metastasis (greater than 0.2 mm but less than 2 mm). Status chemotherapy with Adriamycin/Cytoxan followed by Taxol. Completed planned hormonal therapy with Femara x 7 years (started September 2007).   HISTORY OF PRESENT ILLNESS:  Patient returns for continued oncology followup, she was seen in August 2015. States that she is overall doing steady and denies any active complaints. Eating steady, denies unintentional weight loss. She has completed planned Femara therapy few years ago. No major hot flashes.  Denies any severe joint pains either.  Denies feeling any new breast masses on self examination. No new bone pains, 0/10.  No new mood disturbances. Remains physically active and ambulatory.  REVIEW OF SYSTEMS:   ROS As in HPI above. In addition, no fevers or sweats. No new headaches or focal weakness.  No new mood disturbances. No sore throat, cough, shortness of breath, sputum, hemoptysis or chest pain. No abdominal pain, constipation, diarrhea, dysuria or hematuria. No new skin rash or bleeding symptoms. No new paresthesias in extremities. No polyuria polydipsia. PS ECOG 0.  PAST MEDICAL HISTORY: Reviewed. Past Medical History  Diagnosis Date  . Shingles 2006  . Low blood potassium   . Head injury 1995  . History of radiation therapy 2006  . History of chemotherapy 2006  . Cancer  2006    left breast with radiation and chemo  . Breast cancer 09/16/2005    Left breast, 2.1 cm histologic grade 2, pT2,N1 (mic),M0. ER 90%, PR 70%, HER-2/neu not amplified.  Marland Kitchen Hx of rheumatic fever   . Stress at home   . Vitamin D deficiency   . Bilateral cataracts   . Allergy   . Insomnia   . Dyslipidemia   . Edema   . Hypokalemia   . Hypertension   . Symptomatic menopausal or female climacteric states   . Osteoporosis   . Metabolic syndrome   . History of breast cancer     PAST SURGICAL HISTORY: Reviewed. Past Surgical History  Procedure Laterality Date  . Port-a-cath removal  11/2007  . Portacath placement  2006  . Breast surgery Left 2006    lumpectomy  . Breast biopsy Left 1991,2006  . Breast biopsy Right 10/22/2014    Fibrocystic changes, pseudo-angiomatous stromal hyperplasia.  . Abdominal hysterectomy    . Fracture left arm      Repair  . Cataract extraction, bilateral      Left-07/2014 Right-06-25-14  . Breast lumpectomy  09/2005    FAMILY HISTORY: Reviewed. Family History  Problem Relation Age of Onset  . Tuberculosis Mother   . Cancer Father     leukemia  . Other Brother     spinal meningitis  . Cancer Daughter     Breast  . Diabetes Daughter   . Rheum arthritis Daughter   . Obesity Daughter     SOCIAL HISTORY: Reviewed. Social History  Substance Use Topics  . Smoking status: Former Research scientist (life sciences)  . Smokeless  tobacco: Never Used  . Alcohol Use: No    Allergies  Allergen Reactions  . Darvon [Propoxyphene] Anaphylaxis  . Loratadine Anaphylaxis  . Cephalosporins   . Codeine   . Eggs Or Egg-Derived Products   . Paroxetine   . Penicillins   . Sulfa Antibiotics   . Xanax  [Alprazolam]   . Percocet [Oxycodone-Acetaminophen] Itching, Rash and Other (See Comments)    wheezing    Current Outpatient Prescriptions  Medication Sig Dispense Refill  . furosemide (LASIX) 20 MG tablet TAKE ONE TABLET BY MOUTH EVERY DAY 90 tablet 0  . latanoprost  (XALATAN) 0.005 % ophthalmic solution Place 1 drop into both eyes at bedtime.    . potassium chloride SA (K-DUR,KLOR-CON) 20 MEQ tablet Take 20 mEq by mouth 4 (four) times daily.    . simvastatin (ZOCOR) 40 MG tablet Take 40 mg by mouth daily.    Marland Kitchen spironolactone (ALDACTONE) 100 MG tablet Take 100 mg by mouth daily.     No current facility-administered medications for this visit.    PHYSICAL EXAM: Filed Vitals:   06/19/15 1548  BP: 119/69  Pulse: 103  Temp: 98.4 F (36.9 C)  Resp: 16     Body mass index is 28.65 kg/(m^2).    ECOG FS:0 - Asymptomatic  GENERAL: Patient is alert and oriented and in no acute distress. No icterus. HEENT: EOMs intact. No cervical lymphadenopathy. CVS: S1S2, regular LUNGS: Bilaterally clear to auscultation, no rhonchi. ABDOMEN: Soft, nontender. No hepatomegaly clinically.  NEURO: grossly nonfocal, cranial nerves are intact.   EXTREMITIES: No pedal edema. BREASTS: chronic thickening/scar tissue palpable in the inferior aspect of the left breast, otherwise no masses. No masses in the right breast.  No axillary adenopathy on either side. Exam performed in the presence of a nurse   LAB RESULTS:    Component Value Date/Time   CREATININE 0.95 06/19/2015 1528   CREATININE 0.97 06/17/2014 1434   PROT 7.4 06/19/2015 1528   PROT 7.0 06/17/2014 1434   ALBUMIN 4.5 06/19/2015 1528   ALBUMIN 4.0 06/17/2014 1434   AST 37 06/19/2015 1528   AST 28 06/17/2014 1434   ALT 30 06/19/2015 1528   ALT 33 06/17/2014 1434   ALKPHOS 97 06/19/2015 1528   ALKPHOS 106 06/17/2014 1434   BILITOT 0.7 06/19/2015 1528   BILITOT 0.4 06/17/2014 1434   GFRNONAA 57* 06/19/2015 1528   GFRNONAA 58* 06/17/2014 1434   GFRAA >60 06/19/2015 1528   GFRAA >60 06/17/2014 1434    Lab Results  Component Value Date   WBC 7.4 06/19/2015   NEUTROABS 4.4 06/19/2015   HGB 15.0 06/19/2015   HCT 44.6 06/19/2015   MCV 98.2 06/19/2015   PLT 164 06/19/2015    STUDIES: 06/19/15 - serum CA  27-29 level normal at 21.3.  04/09/15 - Right Mammogram/Ultrasound.  IMPRESSION:  1. No mammographic evidence for malignancy.  2. I feel that the lesion in the lower central portion of the right breast has been sampled based on the additional images performed today. Given the benign histology, no further evaluation is recommended for this specific finding.   RECOMMENDATION: Bilateral screening mammogram is recommended in December 2016. BI-RADS CATEGORY 2: Benign.    ASSESSMENT / PLAN:   1. Breast cancer, stage IIB as described above - reviewed labs from today and d/w patient, serum CA 27.29 level also is in normal range. Patient is doing steady with no clinical evidence of recurrent breast cancer. Recent right mammogram/ultrasound was reported benign findings, BI-RADS  2.  Patient has completed hormonal therapy with Femara for 7 years. She was reminded to continue taking calcium and vitamin D supplement twice daily for prevention of osteoporosis, states that she gets surveillance DEXA scan by primary physician. Patient also stated that next surveillance bilateral mammogram is being scheduled through primary physician towards end of the year. Given that she is close to 10 years out of initial surgery, do not see the need for continued oncology follow-up and she is being discharged from our clinic. Recommend continuing monthly breast self-exams, and MD breast exam when she comes for routine primary care visits. I would be happy to see her back in the future if any oncology issues showed recur. Patient is agreeable to this.    2.  Mild Thrombocytopenia - ? ITP versus other etiology. Platelet count is back in normal range, no bleeding symptoms. Recommend monitoring CBC and platelet count upon routine primary care visits. 3. In between visits, the patient has been advised to call or come to the ER in case of fevers, bleeding symptoms, acute sickness or new symptoms. She is agreeable to this plan.    Leia Alf, MD   07/06/2015 11:44 AM

## 2015-07-08 ENCOUNTER — Encounter: Payer: Self-pay | Admitting: Family Medicine

## 2015-07-08 ENCOUNTER — Ambulatory Visit (INDEPENDENT_AMBULATORY_CARE_PROVIDER_SITE_OTHER): Payer: Commercial Managed Care - HMO | Admitting: Family Medicine

## 2015-07-08 VITALS — BP 110/62 | HR 99 | Temp 98.3°F | Resp 14 | Ht 64.0 in | Wt 166.4 lb

## 2015-07-08 DIAGNOSIS — R739 Hyperglycemia, unspecified: Secondary | ICD-10-CM

## 2015-07-08 DIAGNOSIS — Z87898 Personal history of other specified conditions: Secondary | ICD-10-CM | POA: Diagnosis not present

## 2015-07-08 DIAGNOSIS — E785 Hyperlipidemia, unspecified: Secondary | ICD-10-CM | POA: Diagnosis not present

## 2015-07-08 DIAGNOSIS — D692 Other nonthrombocytopenic purpura: Secondary | ICD-10-CM

## 2015-07-08 DIAGNOSIS — E559 Vitamin D deficiency, unspecified: Secondary | ICD-10-CM

## 2015-07-08 DIAGNOSIS — Z23 Encounter for immunization: Secondary | ICD-10-CM | POA: Diagnosis not present

## 2015-07-08 DIAGNOSIS — T148 Other injury of unspecified body region: Secondary | ICD-10-CM | POA: Diagnosis not present

## 2015-07-08 DIAGNOSIS — G4700 Insomnia, unspecified: Secondary | ICD-10-CM | POA: Diagnosis not present

## 2015-07-08 DIAGNOSIS — T148XXA Other injury of unspecified body region, initial encounter: Secondary | ICD-10-CM

## 2015-07-08 DIAGNOSIS — I10 Essential (primary) hypertension: Secondary | ICD-10-CM | POA: Diagnosis not present

## 2015-07-08 DIAGNOSIS — Z8719 Personal history of other diseases of the digestive system: Secondary | ICD-10-CM

## 2015-07-08 MED ORDER — SIMVASTATIN 40 MG PO TABS: 40.0000 mg | ORAL_TABLET | Freq: Every day | ORAL | Status: DC

## 2015-07-08 MED ORDER — FUROSEMIDE 20 MG PO TABS: 20.0000 mg | ORAL_TABLET | Freq: Every day | ORAL | Status: DC

## 2015-07-08 MED ORDER — POTASSIUM CHLORIDE CRYS ER 20 MEQ PO TBCR: 20.0000 meq | EXTENDED_RELEASE_TABLET | Freq: Four times a day (QID) | ORAL | Status: DC

## 2015-07-08 MED ORDER — SPIRONOLACTONE 100 MG PO TABS: 100.0000 mg | ORAL_TABLET | Freq: Every day | ORAL | Status: DC

## 2015-07-08 NOTE — Progress Notes (Signed)
Name: Leslie Patel   MRN: [id number]    DOB: 08-Sep-1941   Date:07/08/2015       Progress Note  Subjective  Chief Complaint  Chief Complaint  Patient presents with  . Medication Refill    follow-up  . Hypertension  . Hyperlipidemia    HPI  HTN: she takes Spironolactone, Lasix and potassium for many years. She has a history of severe hypokalemia, and used to take 10 potassium pills daily, but is doing well with current regiment.  She is afraid of stopping medications. Discussed other options for bp therapy and taking off diuretic, but she is scared of changing it.   Hyperlipidemia: taking Simvastatin, discussed changing to Atorvastatin but she is afraid of changing therapy.  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  Osteoporosis: she refuses to take medication for it, on Calcium plus D  Patient Active Problem List   Diagnosis Date Noted  . Senile purpura 07/08/2015  . History of dental problems 07/08/2015  . Benign essential HTN 07/04/2015  . Dyslipidemia 07/04/2015  . Personal history of malignant neoplasm of breast 07/04/2015  . H/O: rheumatic fever 07/04/2015  . Insomnia 07/04/2015  . Dysmetabolic syndrome 42/59/5638  . OP (osteoporosis) 07/04/2015  . Allergic rhinitis, seasonal 07/04/2015  . Vitamin D deficiency 07/04/2015  . Tobacco use 07/04/2015  . Abnormal mammogram 10/21/2014    Past Surgical History  Procedure Laterality Date  . Port-a-cath removal  11/2007  . Portacath placement   2006  . Breast surgery Left 2006    lumpectomy  . Breast biopsy Left 1991,2006  . Breast biopsy Right 10/22/2014    Fibrocystic changes, pseudo-angiomatous stromal hyperplasia.  . Abdominal hysterectomy    . Fracture left arm      Repair  . Cataract extraction, bilateral      Left-07/2014 Right-06-25-14  . Breast lumpectomy  09/2005    Family History  Problem Relation Age of Onset  . Tuberculosis Mother   . Cancer Father     leukemia  . Other Brother     spinal meningitis  . Cancer Daughter     Breast  . Diabetes Daughter   . Rheum arthritis Daughter   . Obesity Daughter     Social History   Social History  . Marital Status: Widowed    Spouse Name: N/A  . Number of Children: N/A  . Years of Education: N/A   Occupational History  . Not on file.   Social History Main Topics  . Smoking status: Former Research scientist (life sciences)  . Smokeless tobacco: Never Used  . Alcohol Use: No  . Drug Use: No  . Sexual Activity: Not Currently   Other Topics Concern  . Not on file   Social History Narrative     Current outpatient prescriptions:  .  Calcium Carb-Cholecalciferol 600-100 MG-UNIT CAPS, Take 1 tablet by mouth daily., Disp: , Rfl:  .  furosemide (LASIX) 20 MG tablet, Take 1 tablet (20 mg total) by mouth  daily., Disp: 90 tablet, Rfl: 3 .  latanoprost (XALATAN) 0.005 % ophthalmic solution, Place 1 drop into both eyes at bedtime., Disp: , Rfl:  .  potassium chloride SA (K-DUR,KLOR-CON) 20 MEQ tablet, Take 1 tablet (20 mEq total) by mouth 4 (four) times daily., Disp: 360 tablet, Rfl: 3 .  simvastatin (ZOCOR) 40 MG tablet, Take 1 tablet (40 mg total) by mouth daily., Disp: 90 tablet, Rfl: 3 .  spironolactone (ALDACTONE) 100 MG tablet, Take 1 tablet (100 mg total) by mouth daily., Disp: 90 tablet, Rfl: 3  Allergies  Allergen Reactions  . Darvon [Propoxyphene] Anaphylaxis  . Loratadine Anaphylaxis  . Cephalosporins   . Codeine   . Eggs Or Egg-Derived Products   . Paroxetine   .  Penicillins   . Sulfa Antibiotics   . Xanax  [Alprazolam]   . Percocet [Oxycodone-Acetaminophen] Itching, Rash and Other (See Comments)    wheezing     ROS  Constitutional: Negative for fever , positive for  weight change.  Respiratory: Negative for cough and shortness of breath.   Cardiovascular: Negative for chest pain or palpitations.  Gastrointestinal: Negative for abdominal pain, no bowel changes.  Musculoskeletal: Negative for gait problem or joint swelling.  Skin: Negative for rash.  Neurological: Negative for dizziness or headache.  No other specific complaints in a complete review of systems (except as listed in HPI above).  Objective  Filed Vitals:   07/08/15 1348  BP: 110/62  Pulse: 99  Temp: 98.3 F (36.8 C)  TempSrc: Oral  Resp: 14  Height: 5' 4" (1.626 m)  Weight: 166 lb 6.4 oz (75.479 kg)  SpO2: 96%    Body mass index is 28.55 kg/(m^2).  Physical Exam  Constitutional: Patient appears well-developed and well-nourished. Obese No distress.  HEENT: head atraumatic, normocephalic, pupils equal and reactive to light, missing upper teeth, tender to touch on left maxillary area , neck supple, throat within normal limits Cardiovascular: Normal rate, regular rhythm and normal heart sounds.  No murmur heard. No BLE edema. Pulmonary/Chest: Effort normal and breath sounds normal. No respiratory distress. Abdominal: Soft.  There is no tenderness. Psychiatric: Patient has a normal mood and affect. behavior is normal. Judgment and thought content normal. Skin: senile purpura upper extremity also abrasion near left elbow from working in her yard  Recent Results (from the past 2160 hour(s))  CBC with Differential/Platelet     Status: None   Collection Time: 06/19/15  3:28 PM  Result Value Ref Range   WBC 7.4 3.6 - 11.0 K/uL   RBC 4.54 3.80 - 5.20 MIL/uL   Hemoglobin 15.0 12.0 - 16.0 g/dL   HCT 44.6 35.0 - 47.0 %   MCV 98.2 80.0 - 100.0 fL   MCH 33.1 26.0 - 34.0 pg    MCHC 33.7 32.0 - 36.0 g/dL   RDW 12.3 11.5 - 14.5 %   Platelets 164 150 - 440 K/uL   Neutrophils Relative % 59 %   Neutro Abs 4.4 1.4 - 6.5 K/uL   Lymphocytes Relative 30 %   Lymphs Abs 2.2 1.0 - 3.6 K/uL   Monocytes Relative 8 %   Monocytes Absolute 0.6 0.2 - 0.9 K/uL   Eosinophils Relative 2 %   Eosinophils Absolute 0.2 0 - 0.7 K/uL   Basophils Relative 1 %   Basophils Absolute 0.1 0 - 0.1 K/uL  Creatinine, serum     Status: Abnormal   Collection Time: 06/19/15  3:28 PM  Result Value Ref Range  Creatinine, Ser 0.95 0.44 - 1.00 mg/dL   GFR calc non Af Amer 57 (L) >60 mL/min   GFR calc Af Amer >60 >60 mL/min    Comment: (NOTE) The eGFR has been calculated using the CKD EPI equation. This calculation has not been validated in all clinical situations. eGFR's persistently <60 mL/min signify possible Chronic Kidney Disease.   Hepatic function panel     Status: None   Collection Time: 06/19/15  3:28 PM  Result Value Ref Range   Total Protein 7.4 6.5 - 8.1 g/dL   Albumin 4.5 3.5 - 5.0 g/dL   AST 37 15 - 41 U/L   ALT 30 14 - 54 U/L   Alkaline Phosphatase 97 38 - 126 U/L   Total Bilirubin 0.7 0.3 - 1.2 mg/dL   Bilirubin, Direct 0.1 0.1 - 0.5 mg/dL   Indirect Bilirubin 0.6 0.3 - 0.9 mg/dL  CA 27.29 (SERIAL MONITOR)     Status: None   Collection Time: 06/19/15  3:28 PM  Result Value Ref Range   CA 27.29 21.3 0.0 - 38.6 U/mL    Comment: (NOTE) [employer]/ACS methodology Performed At: [facility] [place], [place] [id number] Lindon Romp MD ZO:[id number]      PHQ2/9: Depression screen [facility] 2/9 07/08/2015  Decreased Interest 0  Down, Depressed, Hopeless 0  PHQ - 2 Score 0    Fall Risk: Fall Risk  07/08/2015  Falls in the past year? No      Assessment & Plan  1. Benign essential HTN bp is low, but she denies dizziness and does not want to change medication at this time, discussed risk of falls - spironolactone (ALDACTONE) 100 MG  tablet; Take 1 tablet (100 mg total) by mouth daily.  Dispense: 90 tablet; Refill: 3 - potassium chloride SA (K-DUR,KLOR-CON) 20 MEQ tablet; Take 1 tablet (20 mEq total) by mouth 4 (four) times daily.  Dispense: 360 tablet; Refill: 3 - furosemide (LASIX) 20 MG tablet; Take 1 tablet (20 mg total) by mouth daily.  Dispense: 90 tablet; Refill: 3 - Comprehensive metabolic panel  2. Dyslipidemia  - simvastatin (ZOCOR) 40 MG tablet; Take 1 tablet (40 mg total) by mouth daily.  Dispense: 90 tablet; Refill: 3 - Lipid panel  3. Needs flu shot  - Flu vaccine HIGH DOSE PF (Fluzone High dose)  4. Need for tetanus booster  - Tdap : Tetanus/diphtheria/apertussis >7yo Preservative  free  5. Insomnia Still has problems, but states she is afraid of taking medication to sleep because of possible side effects/drug reactions  6. Skin abrasion  - Td : Tetanus/diphtheria >7yo Preservative  free  7. Senile purpura   8. Hyperglycemia  - Hemoglobin A1c  9. History of dental problems Follow up with oral surgeon   10. Vitamin D deficiency  - Vit D  25 hydroxy (rtn osteoporosis monitoring)

## 2015-07-09 LAB — COMPREHENSIVE METABOLIC PANEL
ALK PHOS: 92 IU/L (ref 39–117)
ALT: 27 IU/L (ref 0–32)
AST: 30 IU/L (ref 0–40)
Albumin/Globulin Ratio: 2.2 (ref 1.1–2.5)
Albumin: 4.6 g/dL (ref 3.5–4.8)
BILIRUBIN TOTAL: 0.6 mg/dL (ref 0.0–1.2)
BUN / CREAT RATIO: 10 — AB (ref 11–26)
BUN: 9 mg/dL (ref 8–27)
CO2: 24 mmol/L (ref 18–29)
CREATININE: 0.89 mg/dL (ref 0.57–1.00)
Calcium: 9.8 mg/dL (ref 8.7–10.3)
Chloride: 95 mmol/L — ABNORMAL LOW (ref 97–108)
GFR calc Af Amer: 73 mL/min/{1.73_m2} (ref >59)
GFR calc non Af Amer: 64 mL/min/{1.73_m2} (ref >59)
GLOBULIN, TOTAL: 2.1 g/dL (ref 1.5–4.5)
Glucose: 112 mg/dL — ABNORMAL HIGH (ref 65–99)
Potassium: 5 mmol/L (ref 3.5–5.2)
SODIUM: 135 mmol/L (ref 134–144)
Total Protein: 6.7 g/dL (ref 6.0–8.5)

## 2015-07-09 LAB — LIPID PANEL
Chol/HDL Ratio: 2.6 ratio units (ref 0.0–4.4)
Cholesterol, Total: 148 mg/dL (ref 100–199)
HDL: 58 mg/dL (ref >39)
LDL Calculated: 65 mg/dL (ref 0–99)
TRIGLYCERIDES: 123 mg/dL (ref 0–149)
VLDL CHOLESTEROL CAL: 25 mg/dL (ref 5–40)

## 2015-07-09 LAB — VITAMIN D 25 HYDROXY (VIT D DEFICIENCY, FRACTURES): Vit D, 25-Hydroxy: 30.1 ng/mL (ref 30.0–100.0)

## 2015-07-09 LAB — HEMOGLOBIN A1C
Est. average glucose Bld gHb Est-mCnc: 128 mg/dL
HEMOGLOBIN A1C: 6.1 % — AB (ref 4.8–5.6)

## 2015-07-09 NOTE — Progress Notes (Signed)
Pt.notified

## 2015-08-14 ENCOUNTER — Other Ambulatory Visit: Payer: Self-pay | Admitting: Family Medicine

## 2015-08-14 NOTE — Telephone Encounter (Signed)
Patient requesting refill. 

## 2015-08-22 ENCOUNTER — Other Ambulatory Visit: Payer: Self-pay

## 2015-08-22 DIAGNOSIS — Z1231 Encounter for screening mammogram for malignant neoplasm of breast: Secondary | ICD-10-CM

## 2015-10-08 ENCOUNTER — Telehealth: Payer: Self-pay | Admitting: Family Medicine

## 2015-10-08 NOTE — Telephone Encounter (Signed)
DR OFFICE NEEDS REF FOR PT FOR OFFICE VISIT DEC 20TH @ 1:OO. THIS IS FOR BREAST CANCER

## 2015-10-09 ENCOUNTER — Other Ambulatory Visit: Payer: Self-pay | Admitting: General Surgery

## 2015-10-09 ENCOUNTER — Ambulatory Visit
Admission: RE | Admit: 2015-10-09 | Discharge: 2015-10-09 | Disposition: A | Discharge status: Home / Self Care | Payer: Commercial Managed Care - HMO | Source: Ambulatory Visit | Attending: General Surgery | Admitting: General Surgery

## 2015-10-09 DIAGNOSIS — Z1231 Encounter for screening mammogram for malignant neoplasm of breast: Secondary | ICD-10-CM | POA: Diagnosis not present

## 2015-10-09 NOTE — Telephone Encounter (Signed)
[employer] Auth Obtained  ICD-10: Z85.3 [id number] Start - 10/09/15  Expires - 04/06/16 Dr. Hervey Ard

## 2015-10-21 ENCOUNTER — Encounter: Payer: Self-pay | Admitting: General Surgery

## 2015-10-21 ENCOUNTER — Ambulatory Visit (INDEPENDENT_AMBULATORY_CARE_PROVIDER_SITE_OTHER): Payer: Commercial Managed Care - HMO | Admitting: General Surgery

## 2015-10-21 VITALS — BP 128/68 | HR 74 | Resp 14 | Ht 64.0 in | Wt 168.0 lb

## 2015-10-21 DIAGNOSIS — Z853 Personal history of malignant neoplasm of breast: Secondary | ICD-10-CM

## 2015-10-21 NOTE — Patient Instructions (Signed)
Patient to return in one year bilateral screening mammogram.

## 2015-10-21 NOTE — Progress Notes (Signed)
Patient ID: Leslie Patel, female   DOB: 1941/01/26, 74 y.o.   MRN: [id number]  Chief Complaint  Patient presents with  . Follow-up    mammogram     HPI [name] is a 74 y.o. female who presents for a breast evaluation. The most recent mammogram was done on 10/09/15.  Patient does perform regular self breast checks and gets regular mammograms done.   I personally reviewed the patient's clinical history.   HPI  Past Medical History  Diagnosis Date  . Shingles 2006  . Low blood potassium   . Head injury 1995  . History of radiation therapy 2006  . History of chemotherapy 2006  . Cancer [facility]) 2006    left breast with radiation and chemo  . Breast cancer ([name]) 09/16/2005    Left breast, 2.1 cm histologic grade 2, pT2,N1 (mic),M0. ER 90%, PR 70%, HER-2/neu not amplified.  Marland Kitchen Hx of rheumatic fever   . Stress at home   . Vitamin D deficiency   . Bilateral cataracts   . Allergy   . Insomnia   . Dyslipidemia   . Edema   . Hypokalemia   . Hypertension   . Symptomatic menopausal or female climacteric states   . Osteoporosis   . Metabolic syndrome   . History of breast cancer     Past Surgical History  Procedure Laterality Date  . Port-a-cath removal  11/2007  . Portacath placement  2006  . Breast surgery Left 2006    lumpectomy  . Breast biopsy Left 1991,2006  . Breast biopsy Right 10/22/2014    Fibrocystic changes, pseudo-angiomatous stromal hyperplasia.  . Abdominal hysterectomy    . Fracture left arm      Repair  . Cataract extraction, bilateral      Left-07/2014 Right-06-25-14  . Breast lumpectomy  09/2005    Family History  Problem Relation Age of Onset  . Tuberculosis Mother   . Cancer Father     leukemia  . Other Brother     spinal meningitis  . Cancer Daughter     Breast  . Diabetes Daughter   . Rheum arthritis Daughter   . Obesity Daughter   . Breast cancer Daughter     Social History Social History  Substance Use Topics  . Smoking  status: Former Research scientist (life sciences)  . Smokeless tobacco: Never Used  . Alcohol Use: No    Allergies  Allergen Reactions  . Darvon [Propoxyphene] Anaphylaxis  . Loratadine Anaphylaxis  . Cephalosporins   . Codeine   . Eggs Or Egg-Derived Products   . Paroxetine   . Penicillins   . Sulfa Antibiotics   . Xanax  [Alprazolam]   . Percocet [Oxycodone-Acetaminophen] Itching, Rash and Other (See Comments)    wheezing    Current Outpatient Prescriptions  Medication Sig Dispense Refill  . Calcium Carb-Cholecalciferol 600-100 MG-UNIT CAPS Take 1 tablet by mouth daily.    . furosemide (LASIX) 20 MG tablet TAKE ONE TABLET BY MOUTH EVERY DAY 90 tablet 1  . latanoprost (XALATAN) 0.005 % ophthalmic solution Place 1 drop into both eyes at bedtime.    . potassium chloride SA (K-DUR,KLOR-CON) 20 MEQ tablet Take 1 tablet (20 mEq total) by mouth 4 (four) times daily. 360 tablet 3  . simvastatin (ZOCOR) 40 MG tablet TAKE ONE TABLET BY MOUTH EVERY DAY FOR CHOLESTEROL 90 tablet 1  . spironolactone (ALDACTONE) 100 MG tablet Take 1 tablet (100 mg total) by mouth daily. 90 tablet 3  No current facility-administered medications for this visit.    Review of Systems Review of Systems  Constitutional: Negative.   Respiratory: Negative.   Cardiovascular: Negative.     Blood pressure 128/68, pulse 74, resp. rate 14, height 5' 4" (1.626 m), weight 168 lb (76.204 kg).  Physical Exam Physical Exam  Constitutional: She is oriented to person, place, and time. She appears well-developed and well-nourished.  Eyes: Conjunctivae are normal. No scleral icterus.  Neck: Neck supple.  Cardiovascular: Normal rate, regular rhythm and normal heart sounds.   Pulmonary/Chest: Effort normal and breath sounds normal. Right breast exhibits no inverted nipple, no mass, no nipple discharge, no skin change and no tenderness. Left breast exhibits no inverted nipple, no mass, no nipple discharge, no skin change and no tenderness.     Lymphadenopathy:    She has no cervical adenopathy.    She has no axillary adenopathy.  Neurological: She is alert and oriented to person, place, and time.  Skin: Skin is warm.  Vitals reviewed.   Data Reviewed Bilateral screening mammograms dated 10/09/2015 were reviewed. Fatty replaced breasts. BI-RADS-1.  Assessment    Doing well now 10 years status post treatment of breast cancer.    Plan    Mild skin changes likely secondary to radiation effects. Continued observation is warranted.    Patient to return in one year bilateral screening mammogram.  This information has been scribed by Jessica Qualls CMA.  PCP:  Sowles, Krichna  Byrnett, Jeffrey W 10/22/2015, 4:19 PM    

## 2015-10-22 DIAGNOSIS — Z853 Personal history of malignant neoplasm of breast: Secondary | ICD-10-CM | POA: Insufficient documentation

## 2015-12-09 ENCOUNTER — Other Ambulatory Visit: Payer: Self-pay | Admitting: Family Medicine

## 2016-01-02 ENCOUNTER — Telehealth: Payer: Self-pay | Admitting: Family Medicine

## 2016-01-02 ENCOUNTER — Telehealth: Payer: Self-pay

## 2016-01-02 ENCOUNTER — Encounter: Payer: Self-pay | Admitting: Family Medicine

## 2016-01-02 ENCOUNTER — Ambulatory Visit (INDEPENDENT_AMBULATORY_CARE_PROVIDER_SITE_OTHER): Payer: PPO | Admitting: Family Medicine

## 2016-01-02 VITALS — BP 128/62 | HR 104 | Temp 98.1°F | Resp 18 | Ht 64.0 in | Wt 168.0 lb

## 2016-01-02 DIAGNOSIS — Z23 Encounter for immunization: Secondary | ICD-10-CM | POA: Diagnosis not present

## 2016-01-02 DIAGNOSIS — E785 Hyperlipidemia, unspecified: Secondary | ICD-10-CM | POA: Diagnosis not present

## 2016-01-02 DIAGNOSIS — Z0000 Encounter for general adult medical examination without abnormal findings: Secondary | ICD-10-CM | POA: Diagnosis not present

## 2016-01-02 DIAGNOSIS — Z853 Personal history of malignant neoplasm of breast: Secondary | ICD-10-CM | POA: Diagnosis not present

## 2016-01-02 DIAGNOSIS — I10 Essential (primary) hypertension: Secondary | ICD-10-CM

## 2016-01-02 DIAGNOSIS — M810 Age-related osteoporosis without current pathological fracture: Secondary | ICD-10-CM | POA: Diagnosis not present

## 2016-01-02 DIAGNOSIS — G4700 Insomnia, unspecified: Secondary | ICD-10-CM

## 2016-01-02 DIAGNOSIS — D692 Other nonthrombocytopenic purpura: Secondary | ICD-10-CM | POA: Diagnosis not present

## 2016-01-02 MED ORDER — FUROSEMIDE 20 MG PO TABS: 20.0000 mg | ORAL_TABLET | Freq: Every day | ORAL | Status: DC

## 2016-01-02 MED ORDER — SIMVASTATIN 40 MG PO TABS: 40.0000 mg | ORAL_TABLET | Freq: Every day | ORAL | Status: DC

## 2016-01-02 MED ORDER — POTASSIUM CHLORIDE CRYS ER 20 MEQ PO TBCR: 20.0000 meq | EXTENDED_RELEASE_TABLET | Freq: Four times a day (QID) | ORAL | Status: DC

## 2016-01-02 NOTE — Telephone Encounter (Signed)
Patient forgot to ask you for a refill on Potassium please send to [facility]

## 2016-01-02 NOTE — Telephone Encounter (Signed)
Patient requesting refill. 

## 2016-01-02 NOTE — Telephone Encounter (Signed)
sent 

## 2016-01-02 NOTE — Progress Notes (Signed)
Name: Leslie Patel   MRN: [id number]    DOB: 07-27-1941   Date:01/02/2016       Progress Note  Subjective  Chief Complaint  Chief Complaint  Patient presents with  . Annual Exam    HPI  Functional ability/safety issues: No Issues Hearing issues: Addressed  Activities of daily living: Discussed Home safety issues: No Issues  End Of Life Planning: Offered verbal information regarding advanced directives, healthcare power of attorney. She refuses  Preventative care, Health maintenance, Preventative health measures discussed.  Preventative screenings discussed today: lab work, colonoscopy, PAP - not indicated  mammogram, DEXA - refuses therapy   Low Dose CT Chest recommended if Age 63-80 years, 30 pack-year currently smoking OR have quit w/in 15years. She is not interested  Lifestyle risk factor issued reviewed: Diet, exercise, weight management, advised patient smoking is not healthy, nutrition/diet.  Preventative health measures discussed (5-10 year plan).  Reviewed and recommended vaccinations: - Pneumovax -refused - Prevnar -refused - Annual Influenza -refused - but also egg allergy - Zostavax -history of shingles - Tdap - up to date  Depression screening: Done Fall risk screening: Done Discuss ADLs/IADLs: Done  Current medical providers: See HPI  Other health risk factors identified this visit: No other issues Cognitive impairment issues: None identified  All above discussed with patient. Appropriate education, counseling and referral will be made based upon the above.    HTN: she takes Spironolactone, Lasix and potassium for many years. She has a history of severe hypokalemia, and used to take 10 potassium pills daily, but is doing well with current regiment. She is afraid of stopping medications. Discussed other options but she is not interested.  Hyperlipidemia: taking Simvastatin, discussed changing to Atorvastatin but she is afraid of changing therapy. No  myalgias, no chest pain.   Metabolic Syndrome/hyperglycemia: she denies polyphagia, polydipsia or polyuria.   History of breast cancer: released from Dr. Fleet Contras, but still goes to oncologist yearly   Patient Active Problem List   Diagnosis Date Noted  . History of breast cancer 10/22/2015  . Senile purpura ([place]) 07/08/2015  . History of dental problems 07/08/2015  . Benign essential HTN 07/04/2015  . Dyslipidemia 07/04/2015  . Personal history of malignant neoplasm of breast 07/04/2015  . H/O: rheumatic fever 07/04/2015  . Insomnia 07/04/2015  . Dysmetabolic syndrome [id number]  . OP (osteoporosis) 07/04/2015  . Allergic rhinitis, seasonal 07/04/2015  . Vitamin D deficiency 07/04/2015  . Tobacco use 07/04/2015    Past Surgical History  Procedure Laterality Date  . Port-a-cath removal  11/2007  . Portacath placement  2006  . Breast surgery Left 2006    lumpectomy  . Breast biopsy Left 1991,2006  . Breast biopsy Right 10/22/2014    Fibrocystic changes, pseudo-angiomatous stromal hyperplasia.  . Abdominal hysterectomy    . Fracture left arm      Repair  . Cataract extraction, bilateral      Left-07/2014 Right-06-25-14  . Breast lumpectomy  09/2005    Family History  Problem Relation Age of Onset  . Tuberculosis Mother   . Cancer Father     leukemia  . Other Brother     spinal meningitis  . Cancer Daughter     Breast  . Diabetes Daughter   . Rheum arthritis Daughter   . Obesity Daughter   . Breast cancer Daughter     Social History   Social History  . Marital Status: Widowed    Spouse Name: N/A  . Number of  Children: N/A  . Years of Education: N/A   Occupational History  . Not on file.   Social History Main Topics  . Smoking status: Former Research scientist (life sciences)  . Smokeless tobacco: Never Used  . Alcohol Use: No  . Drug Use: No  . Sexual Activity: Not Currently   Other Topics Concern  . Not on file   Social History Narrative     Current outpatient  prescriptions:  .  Calcium Carb-Cholecalciferol 600-100 MG-UNIT CAPS, Take 1 tablet by mouth daily., Disp: , Rfl:  .  furosemide (LASIX) 20 MG tablet, Take 1 tablet (20 mg total) by mouth daily., Disp: 90 tablet, Rfl: 1 .  latanoprost (XALATAN) 0.005 % ophthalmic solution, Place 1 drop into both eyes at bedtime., Disp: , Rfl:  .  potassium chloride SA (K-DUR,KLOR-CON) 20 MEQ tablet, Take 1 tablet (20 mEq total) by mouth 4 (four) times daily., Disp: 360 tablet, Rfl: 3 .  simvastatin (ZOCOR) 40 MG tablet, Take 1 tablet (40 mg total) by mouth daily. for cholesterol, Disp: 90 tablet, Rfl: 1 .  spironolactone (ALDACTONE) 100 MG tablet, take 1 tablet by mouth once daily, Disp: 90 tablet, Rfl: 1  Allergies  Allergen Reactions  . Darvon [Propoxyphene] Anaphylaxis  . Loratadine Anaphylaxis  . Cephalosporins   . Codeine   . Eggs Or Egg-Derived Products   . Paroxetine   . Penicillins   . Sulfa Antibiotics   . Xanax  [Alprazolam]   . Percocet [Oxycodone-Acetaminophen] Itching, Rash and Other (See Comments)    wheezing     ROS  Constitutional: Negative for fever or weight change.  Respiratory: Negative for cough and shortness of breath.   Cardiovascular: Negative for chest pain or palpitations.  Gastrointestinal: Negative for abdominal pain, no bowel changes.  Musculoskeletal: Negative for gait problem or joint swelling.  Skin: Negative for rash.  Neurological: Negative for dizziness or headache.  No other specific complaints in a complete review of systems (except as listed in HPI above).  Objective  Filed Vitals:   01/02/16 1410  BP: 128/62  Pulse: 104  Temp: 98.1 F (36.7 C)  TempSrc: Oral  Resp: 18  Height: 5\' 4"  (1.626 m)  Weight: 168 lb (76.204 kg)  SpO2: 96%    Body mass index is 28.82 kg/(m^2).  Physical Exam  Constitutional: Patient appears well-developed and well-nourished. No distress.  HENT: Head: Normocephalic and atraumatic. Ears: B TMs ok, no erythema or  effusion; Nose: Nose normal. Mouth/Throat: Oropharynx is clear and moist. No oropharyngeal exudate.  Eyes: Conjunctivae and EOM are normal. Pupils are equal, round, and reactive to light. No scleral icterus.  Neck: Normal range of motion. Neck supple. No JVD present. No thyromegaly present.  Cardiovascular: Normal rate, regular rhythm and normal heart sounds.  No murmur heard. No BLE edema. Pulmonary/Chest: Effort normal and breath sounds normal. No respiratory distress. Abdominal: Soft. Bowel sounds are normal, no distension. There is no tenderness. no masses Breast: no lumps or masses, no nipple discharge or rashes Scarring from surgery on left breast, no nipple discharge FEMALE GENITALIA:  Not done RECTAL: not done Musculoskeletal: Normal range of motion, no joint effusions. No gross deformities Neurological: he is alert and oriented to person, place, and time. No cranial nerve deficit. Coordination, balance, strength, speech and gait are normal.  Skin: Skin is warm and dry. No rash noted. No erythema. She has some ecchymosis on upper extremities Psychiatric: Patient has a normal mood and affect. behavior is normal. Judgment and thought content normal.  PHQ2/9: Depression screen [facility] 2/9 01/02/2016 07/08/2015  Decreased Interest 0 0  Down, Depressed, Hopeless 0 0  PHQ - 2 Score 0 0     Fall Risk: Fall Risk  01/02/2016 07/08/2015  Falls in the past year? No No     Functional Status Survey: Is the patient deaf or have difficulty hearing?: No Does the patient have difficulty seeing, even when wearing glasses/contacts?: Yes Does the patient have difficulty concentrating, remembering, or making decisions?: No Does the patient have difficulty walking or climbing stairs?: No Does the patient have difficulty dressing or bathing?: No Does the patient have difficulty doing errands alone such as visiting a doctor's office or shopping?: No   Assessment & Plan  1. Medicare annual wellness visit,  subsequent  Discussed importance of 150 minutes of physical activity weekly, eat two servings of fish weekly, eat one serving of tree nuts ( cashews, pistachios, pecans, almonds.Marland Kitchen) every other day, eat 6 servings of fruit/vegetables daily and drink plenty of water and avoid sweet beverages.   2. Benign essential HTN  - furosemide (LASIX) 20 MG tablet; Take 1 tablet (20 mg total) by mouth daily.  Dispense: 90 tablet; Refill: 1  3. Insomnia  She refuses medication, states she also had problems sleeping but she is afraid of taking medication   4. Senile purpura ([facility])  stable  5. Dyslipidemia  - simvastatin (ZOCOR) 40 MG tablet; Take 1 tablet (40 mg total) by mouth daily. for cholesterol  Dispense: 90 tablet; Refill: 1  6. History of breast cancer  Up to date with mammogram  7. OP (osteoporosis)  Refuses bone density.

## 2016-01-02 NOTE — Patient Instructions (Signed)
  Leslie Patel , Thank you for taking time to come for your Medicare Wellness Visit. I appreciate your ongoing commitment to your health goals. Please review the following plan we discussed and let me know if I can assist you in the future.   These are the goals we discussed:  Never received pneumonia vaccine, discussed importance of taking it   This is a list of the screening recommended for you and due dates:  Health Maintenance  Topic Date Due  . Flu Shot  07/08/2016*  . Pneumonia vaccines (2 of 2 - PPSV23) 01/03/2016  . Tetanus Vaccine  07/07/2025  . DEXA scan (bone density measurement)  Completed  . Shingles Vaccine  Completed  *Topic was postponed. The date shown is not the original due date.

## 2016-01-05 NOTE — Telephone Encounter (Signed)
error 

## 2016-01-13 NOTE — Addendum Note (Signed)
Addended by: Steele Sizer F on: 01/13/2016 01:17 PM   Modules accepted: Miquel Dunn

## 2016-02-16 DIAGNOSIS — H40153 Residual stage of open-angle glaucoma, bilateral: Secondary | ICD-10-CM | POA: Diagnosis not present

## 2016-05-18 ENCOUNTER — Other Ambulatory Visit: Payer: Self-pay | Admitting: Family Medicine

## 2016-05-18 NOTE — Telephone Encounter (Signed)
Patient requesting refill. 

## 2016-06-15 DIAGNOSIS — H40153 Residual stage of open-angle glaucoma, bilateral: Secondary | ICD-10-CM | POA: Diagnosis not present

## 2016-07-02 IMAGING — US US BREAST*R* LIMITED INC AXILLA
1 series · 6 of 6 positions shown · non-contrast
Comparison: Priors

CLINICAL DATA: Patient recalled from screening for right breast
mass.

EXAM:
DIGITAL DIAGNOSTIC  RIGHT MAMMOGRAM
ULTRASOUND RIGHT BREAST

[Series 1: us breast*right* limited inc axilla · 0.08mm/px · 6 of 6 slices shown]
[im 1/6]
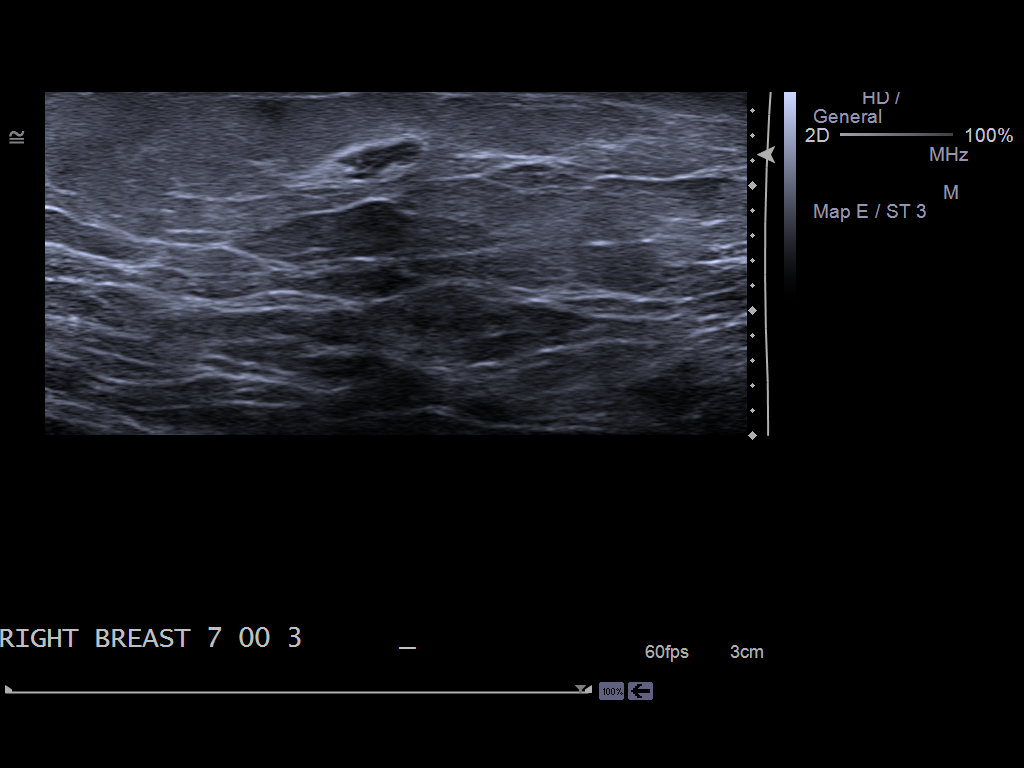
[im 2/6]
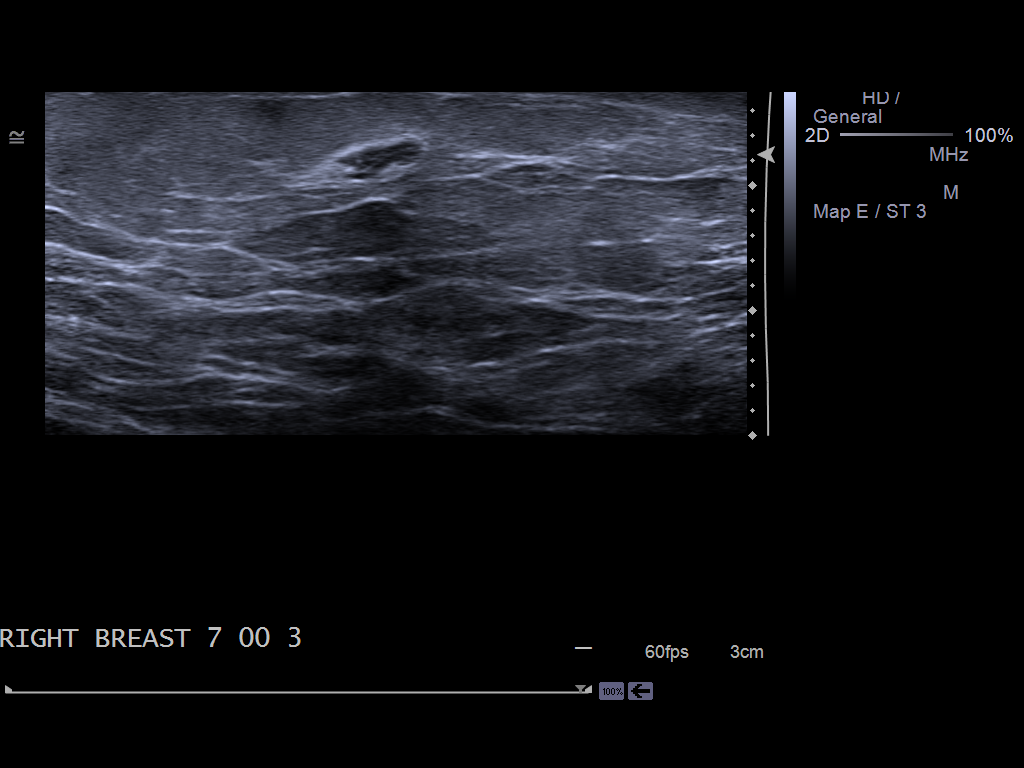
[im 3/6]
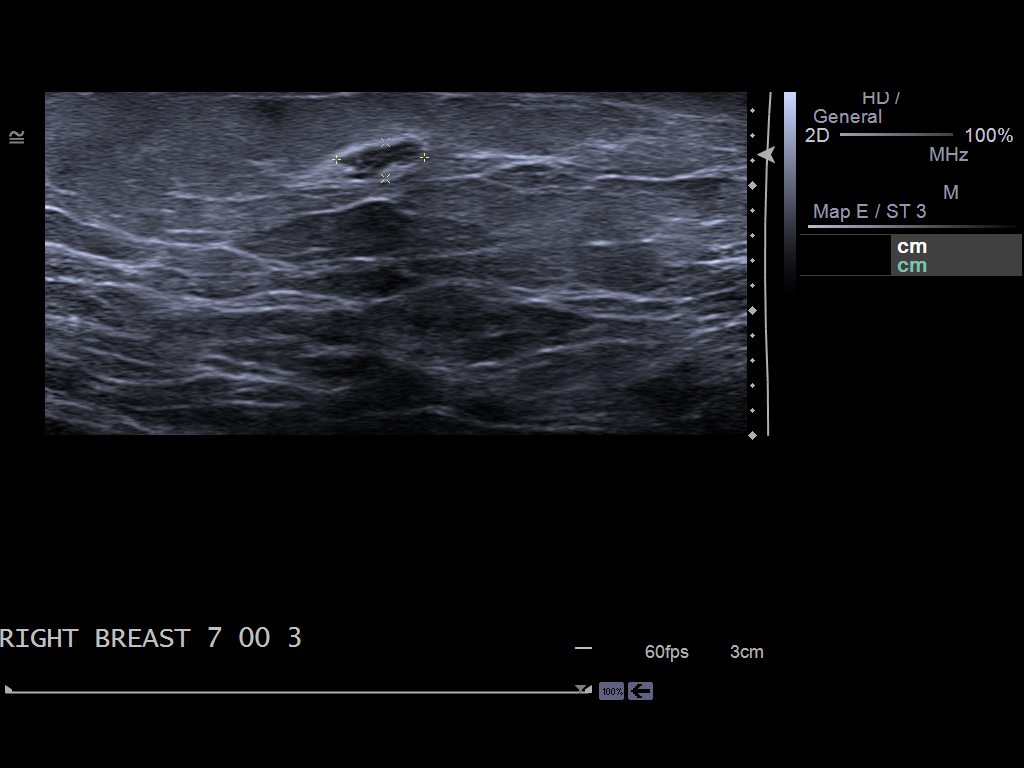
[im 4/6]
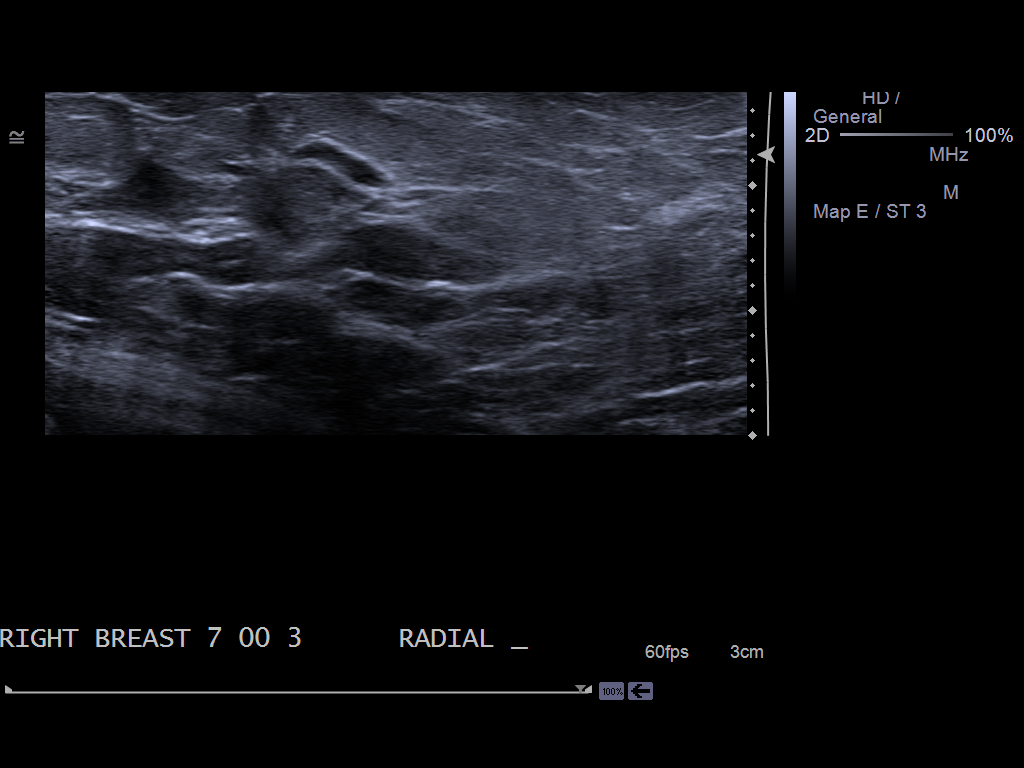
[im 5/6]
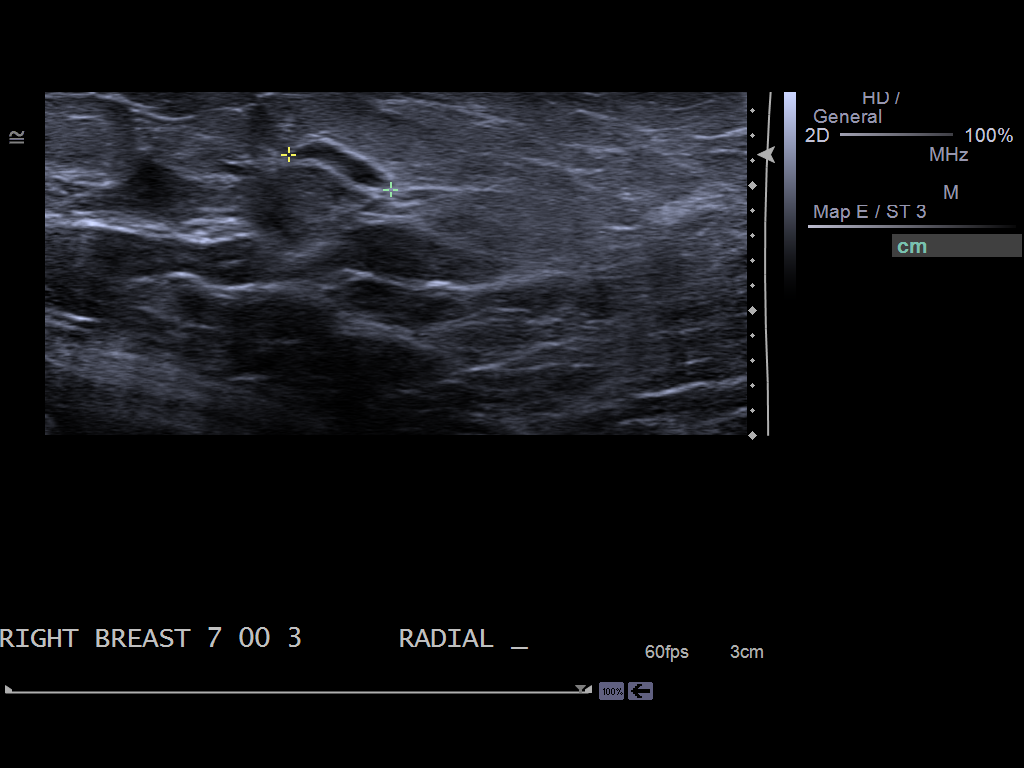
[im 6/6]
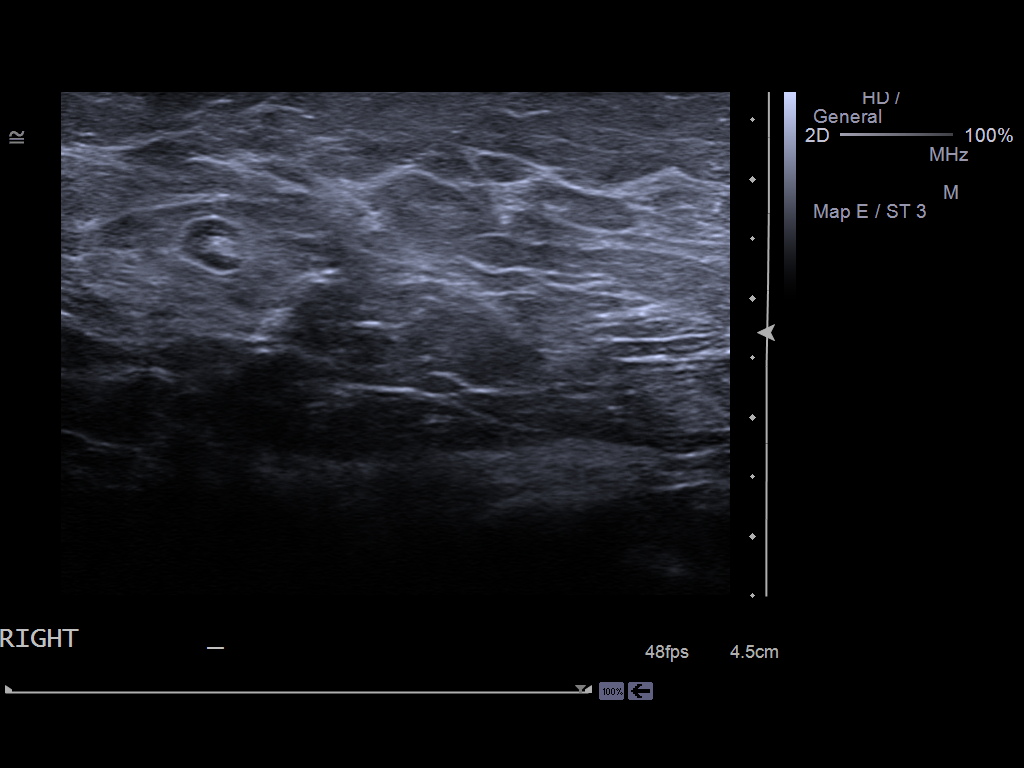

[6 of 6 positions shown; findings below may reference images not displayed]

ACR Breast Density Category b: There are scattered areas of
fibroglandular density.
FINDINGS: Spot compression CC and MLO views demonstrate a persistent
low-density irregular 9 mm mass within the lower outer right breast.

On physical exam, I palpate no discrete mass within the inferior to
inferior outer right breast.

Ultrasound is performed, showing a 9 x 7 x 3 mm irregular hypoechoic
mass within right breast 7 o'clock position 3 cm from nipple. No
right axillary lymphadenopathy.
IMPRESSION: Hypoechoic right breast mass. Ultrasound-guided core needle biopsy
recommended.

RECOMMENDATION:
Ultrasound-guided core needle biopsy right breast mass.

Biopsy to be scheduled at the patient's convenience.

I have discussed the findings and recommendations with the patient.
Results were also provided in writing at the conclusion of the
visit. If applicable, a reminder letter will be sent to the patient
regarding the next appointment.

BI-RADS CATEGORY  4: Suspicious.

## 2016-07-06 ENCOUNTER — Encounter: Payer: Self-pay | Admitting: Family Medicine

## 2016-07-06 ENCOUNTER — Ambulatory Visit (INDEPENDENT_AMBULATORY_CARE_PROVIDER_SITE_OTHER): Payer: PPO | Admitting: Family Medicine

## 2016-07-06 VITALS — BP 126/72 | HR 109 | Temp 98.4°F | Resp 18 | Ht 64.0 in | Wt 164.9 lb

## 2016-07-06 DIAGNOSIS — Z23 Encounter for immunization: Secondary | ICD-10-CM

## 2016-07-06 DIAGNOSIS — G4700 Insomnia, unspecified: Secondary | ICD-10-CM | POA: Diagnosis not present

## 2016-07-06 DIAGNOSIS — E785 Hyperlipidemia, unspecified: Secondary | ICD-10-CM

## 2016-07-06 DIAGNOSIS — R739 Hyperglycemia, unspecified: Secondary | ICD-10-CM

## 2016-07-06 DIAGNOSIS — I10 Essential (primary) hypertension: Secondary | ICD-10-CM

## 2016-07-06 DIAGNOSIS — Z853 Personal history of malignant neoplasm of breast: Secondary | ICD-10-CM

## 2016-07-06 DIAGNOSIS — E559 Vitamin D deficiency, unspecified: Secondary | ICD-10-CM

## 2016-07-06 DIAGNOSIS — D692 Other nonthrombocytopenic purpura: Secondary | ICD-10-CM | POA: Diagnosis not present

## 2016-07-06 DIAGNOSIS — Z08 Encounter for follow-up examination after completed treatment for malignant neoplasm: Secondary | ICD-10-CM | POA: Diagnosis not present

## 2016-07-06 MED ORDER — POTASSIUM CHLORIDE CRYS ER 20 MEQ PO TBCR: 20.0000 meq | EXTENDED_RELEASE_TABLET | Freq: Four times a day (QID) | ORAL | 3 refills | Status: DC

## 2016-07-06 MED ORDER — FUROSEMIDE 20 MG PO TABS: 20.0000 mg | ORAL_TABLET | Freq: Every day | ORAL | 1 refills | Status: DC

## 2016-07-06 MED ORDER — SIMVASTATIN 40 MG PO TABS: 40.0000 mg | ORAL_TABLET | Freq: Every day | ORAL | 1 refills | Status: DC

## 2016-07-06 NOTE — Progress Notes (Signed)
Name: Leslie Patel   MRN: [id number]    DOB: 1941/01/19   Date:07/06/2016       Progress Note  Subjective  Chief Complaint  Chief Complaint  Patient presents with  . Medication Refill    6 month F/U  . Hypertension  . Hyperlipidemia  . Osteoporosis    HPI  HTN: she takes Spironolactone, Lasix and potassium for many years. She has a history of severe hypokalemia, and used to take 10 potassium pills daily, but is doing well with current regiment. She is afraid of stopping medications. Discussed other options but she is not interested. Discussed causes of hypokalemia but he does not want to be checked for it.   Hyperlipidemia: taking Simvastatin, discussed changing to Atorvastatin but she is afraid of changing therapy. No myalgias, no chest pain or decrease in exercise tolerance  Osteoporosis: she is having a lot of teeth procedures and is not a candidate for bisphosphonate, discussed referral to Rheumatologist and consideration to Prolia or Forteo. She is not interested at this time  Metabolic Syndrome/hyperglycemia: she denies polyphagia, polydipsia or polyuria.   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  History of breast cancer: released from Dr. Fleet Contras, but still goes to oncologist yearly   Patient Active Problem List   Diagnosis Date Noted  . History of breast cancer 10/22/2015  . Senile purpura ([place]) 07/08/2015  . History of dental problems 07/08/2015  . Benign essential HTN 07/04/2015  . Dyslipidemia 07/04/2015  . Personal history of malignant neoplasm of breast 07/04/2015  . H/O: rheumatic fever 07/04/2015  . Insomnia 07/04/2015  . Dysmetabolic syndrome [id number]  . OP (osteoporosis) 07/04/2015  . Allergic rhinitis, seasonal 07/04/2015  . Vitamin D deficiency 07/04/2015  . Tobacco use 07/04/2015    Past Surgical History:  Procedure Laterality Date  . ABDOMINAL HYSTERECTOMY    . BREAST BIOPSY Left 1991,2006  . BREAST BIOPSY Right 10/22/2014    Fibrocystic changes, pseudo-angiomatous stromal hyperplasia.  Marland Kitchen BREAST LUMPECTOMY  09/2005  . BREAST SURGERY Left 2006   lumpectomy  . CATARACT EXTRACTION, BILATERAL     Left-07/2014 Right-06-25-14  . fracture left arm     Repair  . PORT-A-CATH REMOVAL  11/2007  . PORTACATH PLACEMENT  2006    Family History  Problem Relation Age of Onset  . Tuberculosis Mother   . Cancer Father     leukemia  . Other Brother     spinal meningitis  . Cancer Daughter     Breast  . Diabetes Daughter   . Rheum arthritis Daughter   . Obesity Daughter   . Breast cancer Daughter     Social History   Social History  . Marital status: Widowed    Spouse name: N/A  . Number of children: N/A  . Years of education: N/A   Occupational History  . Not on file.   Social History Main Topics  . Smoking status: Former Research scientist (life sciences)  . Smokeless tobacco: Never Used  . Alcohol use No  . Drug use: No  . Sexual activity: Not Currently   Other Topics Concern  . Not on file   Social History Narrative  . No narrative on file     Current Outpatient Prescriptions:  .  Calcium Carb-Cholecalciferol 600-100 MG-UNIT CAPS, Take 1 tablet by mouth daily., Disp: , Rfl:  .  furosemide (LASIX) 20 MG tablet, Take 1 tablet (20 mg total) by mouth daily., Disp: 90 tablet, Rfl: 1 .  latanoprost (XALATAN) 0.005 % ophthalmic  solution, Place 1 drop into both eyes at bedtime., Disp: , Rfl:  .  potassium chloride SA (K-DUR,KLOR-CON) 20 MEQ tablet, Take 1 tablet (20 mEq total) by mouth 4 (four) times daily., Disp: 360 tablet, Rfl: 3 .  simvastatin (ZOCOR) 40 MG tablet, Take 1 tablet (40 mg total) by mouth daily. for cholesterol, Disp: 90 tablet, Rfl: 1 .  spironolactone (ALDACTONE) 100 MG tablet, take 1 tablet by mouth once daily, Disp: 90 tablet, Rfl: 1  Allergies  Allergen Reactions  . Darvon [Propoxyphene] Anaphylaxis  . Loratadine Anaphylaxis  . Cephalosporins   . Codeine   . Eggs Or Egg-Derived Products   . Paroxetine    . Penicillins   . Sulfa Antibiotics   . Xanax  [Alprazolam]   . Percocet [Oxycodone-Acetaminophen] Itching, Rash and Other (See Comments)    wheezing     ROS  Constitutional: Negative for fever or weight change.  Respiratory: Negative for cough and shortness of breath.   Cardiovascular: Negative for chest pain or palpitations.  Gastrointestinal: Negative for abdominal pain, no bowel changes.  Musculoskeletal: Negative for gait problem or joint swelling.  Skin: Negative for rash.  Neurological: Negative for dizziness or headache.  No other specific complaints in a complete review of systems (except as listed in HPI above).  Objective  Vitals:   07/06/16 1331  BP: 126/72  Pulse: (!) 109  Resp: 18  Temp: 98.4 F (36.9 C)  TempSrc: Oral  SpO2: 97%  Weight: 164 lb 14.4 oz (74.8 kg)  Height: 5\' 4"  (1.626 m)    Body mass index is 28.31 kg/m.  Physical Exam  Constitutional: Patient appears well-developed and well-nourished. Obese  No distress.  HEENT: head atraumatic, normocephalic, pupils equal and reactive to light,neck supple, throat within normal limits Cardiovascular: Normal rate, regular rhythm and normal heart sounds.  No murmur heard. No BLE edema. Pulmonary/Chest: Effort normal and breath sounds normal. No respiratory distress. Abdominal: Soft.  There is no tenderness. Skin: senile purpura left arm Psychiatric: Patient has a normal mood and affect. behavior is normal. Judgment and thought content normal.  PHQ2/9: Depression screen [facility] 2/9 07/06/2016 01/02/2016 07/08/2015  Decreased Interest 0 0 0  Down, Depressed, Hopeless 0 0 0  PHQ - 2 Score 0 0 0     Fall Risk: Fall Risk  07/06/2016 01/02/2016 07/08/2015  Falls in the past year? No No No      Functional Status Survey: Is the patient deaf or have difficulty hearing?: No Does the patient have difficulty seeing, even when wearing glasses/contacts?: No Does the patient have difficulty concentrating, remembering,  or making decisions?: No Does the patient have difficulty walking or climbing stairs?: No Does the patient have difficulty dressing or bathing?: No Does the patient have difficulty doing errands alone such as visiting a doctor's office or shopping?: No    Assessment & Plan  1. Senile purpura ([place])  stable  2. Benign essential HTN  - COMPLETE METABOLIC PANEL WITH GFR - furosemide (LASIX) 20 MG tablet; Take 1 tablet (20 mg total) by mouth daily.  Dispense: 90 tablet; Refill: 1 - potassium chloride SA (K-DUR,KLOR-CON) 20 MEQ tablet; Take 1 tablet (20 mEq total) by mouth 4 (four) times daily.  Dispense: 360 tablet; Refill: 3  3. Needs flu shot  Not given, allergic to eggs  4. Insomnia  Depends on the level of stress  5. Dyslipidemia  - Lipid panel - simvastatin (ZOCOR) 40 MG tablet; Take 1 tablet (40 mg total) by mouth  daily. for cholesterol  Dispense: 90 tablet; Refill: 1  6. Vitamin D deficiency  - VITAMIN D 25 Hydroxy (Vit-D Deficiency, Fractures)  7. Hyperglycemia  - Hemoglobin A1c  8. Personal history of malignant neoplasm of breast  - MM Digital Screening; Future  9. Encounter for follow-up surveillance of breast cancer  - MM Digital Screening; Future

## 2016-07-07 LAB — LIPID PANEL
CHOLESTEROL: 127 mg/dL (ref 125–200)
HDL: 62 mg/dL (ref >=46)
LDL CALC: 47 mg/dL (ref <130)
TRIGLYCERIDES: 88 mg/dL (ref <150)
Total CHOL/HDL Ratio: 2 Ratio (ref <=5.0)
VLDL: 18 mg/dL (ref <30)

## 2016-07-07 LAB — COMPLETE METABOLIC PANEL WITH GFR
ALBUMIN: 4.3 g/dL (ref 3.6–5.1)
ALT: 25 U/L (ref 6–29)
AST: 27 U/L (ref 10–35)
Alkaline Phosphatase: 74 U/L (ref 33–130)
BILIRUBIN TOTAL: 0.6 mg/dL (ref 0.2–1.2)
BUN: 11 mg/dL (ref 7–25)
CALCIUM: 9.4 mg/dL (ref 8.6–10.4)
CO2: 21 mmol/L (ref 20–31)
Chloride: 100 mmol/L (ref 98–110)
Creat: 0.97 mg/dL — ABNORMAL HIGH (ref 0.60–0.93)
GFR, EST AFRICAN AMERICAN: 66 mL/min (ref >=60)
GFR, Est Non African American: 57 mL/min — ABNORMAL LOW (ref >=60)
Glucose, Bld: 115 mg/dL — ABNORMAL HIGH (ref 65–99)
Potassium: 5.2 mmol/L (ref 3.5–5.3)
Sodium: 132 mmol/L — ABNORMAL LOW (ref 135–146)
TOTAL PROTEIN: 6.5 g/dL (ref 6.1–8.1)

## 2016-07-07 LAB — HEMOGLOBIN A1C
Hgb A1c MFr Bld: 5.6 % (ref <5.7)
MEAN PLASMA GLUCOSE: 114 mg/dL

## 2016-07-07 LAB — VITAMIN D 25 HYDROXY (VIT D DEFICIENCY, FRACTURES): Vit D, 25-Hydroxy: 35 ng/mL (ref 30–100)

## 2016-08-17 ENCOUNTER — Other Ambulatory Visit: Payer: Self-pay

## 2016-08-17 DIAGNOSIS — Z1231 Encounter for screening mammogram for malignant neoplasm of breast: Secondary | ICD-10-CM

## 2016-10-07 DIAGNOSIS — H40153 Residual stage of open-angle glaucoma, bilateral: Secondary | ICD-10-CM | POA: Diagnosis not present

## 2016-10-11 ENCOUNTER — Ambulatory Visit
Admission: RE | Admit: 2016-10-11 | Discharge: 2016-10-11 | Disposition: A | Discharge status: Home / Self Care | Payer: PPO | Source: Ambulatory Visit | Attending: General Surgery | Admitting: General Surgery

## 2016-10-11 DIAGNOSIS — Z1231 Encounter for screening mammogram for malignant neoplasm of breast: Secondary | ICD-10-CM | POA: Diagnosis not present

## 2016-10-12 ENCOUNTER — Encounter: Payer: Self-pay | Admitting: *Deleted

## 2016-10-18 ENCOUNTER — Encounter: Payer: Self-pay | Admitting: General Surgery

## 2016-10-18 ENCOUNTER — Ambulatory Visit (INDEPENDENT_AMBULATORY_CARE_PROVIDER_SITE_OTHER): Payer: PPO | Admitting: General Surgery

## 2016-10-18 VITALS — BP 128/70 | HR 84 | Resp 14 | Ht 63.0 in | Wt 162.0 lb

## 2016-10-18 DIAGNOSIS — Z853 Personal history of malignant neoplasm of breast: Secondary | ICD-10-CM

## 2016-10-18 NOTE — Progress Notes (Signed)
Patient ID: Leslie Patel, female   DOB: [birth year], 75 y.o.   MRN: [id number]  Chief Complaint  Patient presents with  . Follow-up    mammogram    HPI Leslie Patel is a 75 y.o. female with a history of left breast cancer who presents for a breast evaluation. The most recent mammogram was done on 10/11/16. Patient does perform regular self breast checks and gets regular mammograms done. She reports no new breast problems.    HPI  Past Medical History:  Diagnosis Date  . Allergy   . Bilateral cataracts   . Breast cancer ([place]) 09/16/2005   Left breast, 2.1 cm histologic grade 2, pT2,N1 (mic),M0. ER 90%, PR 70%, HER-2/neu not amplified.  . Cancer [facility]) 2006   left breast with radiation and chemo  . Dyslipidemia   . Edema   . Head injury 1995  . History of breast cancer   . History of chemotherapy 2006  . History of radiation therapy 2006  . Hx of rheumatic fever   . Hypertension   . Hypokalemia   . Insomnia   . Low blood potassium   . Metabolic syndrome   . Osteoporosis   . Shingles 2006  . Stress at home   . Symptomatic menopausal or female climacteric states   . Vitamin D deficiency     Past Surgical History:  Procedure Laterality Date  . ABDOMINAL HYSTERECTOMY    . BREAST BIOPSY Left 1991,2006  . BREAST BIOPSY Right 10/22/2014   Fibrocystic changes, pseudo-angiomatous stromal hyperplasia.  Marland Kitchen BREAST LUMPECTOMY  09/2005  . BREAST SURGERY Left 2006   lumpectomy  . CATARACT EXTRACTION, BILATERAL     Left-07/2014 Right-06-25-14  . fracture left arm     Repair  . PORT-A-CATH REMOVAL  11/2007  . PORTACATH PLACEMENT  2006    Family History  Problem Relation Age of Onset  . Tuberculosis Mother   . Cancer Father     leukemia  . Cancer Daughter     Breast  . Diabetes Daughter   . Rheum arthritis Daughter   . Obesity Daughter   . Breast cancer Daughter   . Other Brother     spinal meningitis    Social History Social History  Substance Use Topics  .  Smoking status: Former Research scientist (life sciences)  . Smokeless tobacco: Never Used  . Alcohol use No    Allergies  Allergen Reactions  . Darvon [Propoxyphene] Anaphylaxis  . Loratadine Anaphylaxis  . Cephalosporins   . Codeine   . Eggs Or Egg-Derived Products   . Paroxetine   . Penicillins   . Sulfa Antibiotics   . Xanax  [Alprazolam]   . Percocet [Oxycodone-Acetaminophen] Itching, Rash and Other (See Comments)    wheezing    Current Outpatient Prescriptions  Medication Sig Dispense Refill  . Calcium Carb-Cholecalciferol 600-100 MG-UNIT CAPS Take 1 tablet by mouth daily.    . furosemide (LASIX) 20 MG tablet Take 1 tablet (20 mg total) by mouth daily. 90 tablet 1  . latanoprost (XALATAN) 0.005 % ophthalmic solution Place 1 drop into both eyes at bedtime.    . potassium chloride SA (K-DUR,KLOR-CON) 20 MEQ tablet Take 1 tablet (20 mEq total) by mouth 4 (four) times daily. 360 tablet 3  . simvastatin (ZOCOR) 40 MG tablet Take 1 tablet (40 mg total) by mouth daily. for cholesterol 90 tablet 1  . spironolactone (ALDACTONE) 100 MG tablet take 1 tablet by mouth once daily 90 tablet 1   No  current facility-administered medications for this visit.     Review of Systems Review of Systems  Constitutional: Negative.   Respiratory: Negative.   Cardiovascular: Negative.     Blood pressure 128/70, pulse 84, resp. rate 14, height 5' 3" (1.6 m), weight 162 lb (73.5 kg).  Physical Exam Physical Exam  Constitutional: She is oriented to person, place, and time. She appears well-developed and well-nourished.  Eyes: Conjunctivae are normal. No scleral icterus.  Neck: Neck supple.  Cardiovascular: Normal rate, regular rhythm and normal heart sounds.   Pulmonary/Chest: Effort normal and breath sounds normal. Right breast exhibits no inverted nipple, no mass, no nipple discharge, no skin change and no tenderness. Left breast exhibits no inverted nipple, no mass, no nipple discharge, no skin change and no  tenderness.    Left breast small amount of scarring   Lymphadenopathy:    She has no cervical adenopathy.    She has no axillary adenopathy.  Neurological: She is alert and oriented to person, place, and time.  Skin: Skin is warm and dry.  Psychiatric: She has a normal mood and affect.    Data Reviewed Bilateral mammograms dated 10/11/2016 were reviewed. No interval change. BI-RADS-2.  Assessment    Unremarkable breast exam.  Candidate for colon screening.      Plan    A follow-up screening mammogram only obtained in one year.  The patient reports that she doesn't have time for colonoscopy or to make use of Cologuard testing due to family illnesses and responsibilities there.    This has been scribed by Caryl-Lyn Otis Brace LPN     Robert Bellow 10/18/2016, 9:17 PM

## 2016-11-30 ENCOUNTER — Other Ambulatory Visit: Payer: Self-pay | Admitting: Family Medicine

## 2016-12-09 ENCOUNTER — Encounter: Payer: Self-pay | Admitting: General Surgery

## 2016-12-09 ENCOUNTER — Ambulatory Visit (INDEPENDENT_AMBULATORY_CARE_PROVIDER_SITE_OTHER): Payer: PPO | Admitting: General Surgery

## 2016-12-09 ENCOUNTER — Inpatient Hospital Stay: Payer: Self-pay

## 2016-12-09 VITALS — BP 134/60 | HR 94 | Resp 14 | Ht 64.0 in | Wt 161.0 lb

## 2016-12-09 DIAGNOSIS — N6459 Other signs and symptoms in breast: Secondary | ICD-10-CM | POA: Diagnosis not present

## 2016-12-09 DIAGNOSIS — L989 Disorder of the skin and subcutaneous tissue, unspecified: Secondary | ICD-10-CM | POA: Diagnosis not present

## 2016-12-09 NOTE — Patient Instructions (Addendum)
The patient is aware to call back for any questions or concerns.  May shower May remove dressing in 2-3 days May use an Ice pack as needed for comfort  

## 2016-12-09 NOTE — Progress Notes (Signed)
Patient ID: Leslie Patel, female   DOB: [birth year], 76 y.o.   MRN: [id number]  Chief Complaint  Patient presents with  . Breast Problem    HPI Leslie Patel is a 76 y.o. female.  Here today for left breast redness that was noticed at her last visit in December. She states it is not any different and not painful. History of left breast cancer.   HPI  Past Medical History:  Diagnosis Date  . Allergy   . Bilateral cataracts   . Breast cancer ([place]) 09/16/2005   Left breast, 2.1 cm histologic grade 2, pT2,N1 (mic),M0. ER 90%, PR 70%, HER-2/neu not amplified.  . Cancer [facility]) 2006   left breast with radiation and chemo  . Dyslipidemia   . Edema   . Head injury 1995  . History of breast cancer   . History of chemotherapy 2006  . History of radiation therapy 2006  . Hx of rheumatic fever   . Hypertension   . Hypokalemia   . Insomnia   . Low blood potassium   . Metabolic syndrome   . Osteoporosis   . Shingles 2006  . Stress at home   . Symptomatic menopausal or female climacteric states   . Vitamin D deficiency     Past Surgical History:  Procedure Laterality Date  . ABDOMINAL HYSTERECTOMY    . BREAST BIOPSY Left 1991,2006  . BREAST BIOPSY Right 10/22/2014   Fibrocystic changes, pseudo-angiomatous stromal hyperplasia.  Marland Kitchen BREAST LUMPECTOMY  09/2005  . BREAST SURGERY Left 2006   lumpectomy  . CATARACT EXTRACTION, BILATERAL     Left-07/2014 Right-06-25-14  . fracture left arm     Repair  . PORT-A-CATH REMOVAL  11/2007  . PORTACATH PLACEMENT  2006    Family History  Problem Relation Age of Onset  . Tuberculosis Mother   . Cancer Father     leukemia  . Cancer Daughter     Breast  . Diabetes Daughter   . Rheum arthritis Daughter   . Obesity Daughter   . Breast cancer Daughter   . Other Brother     spinal meningitis    Social History Social History  Substance Use Topics  . Smoking status: Former Research scientist (life sciences)  . Smokeless tobacco: Never Used  . Alcohol use No     Allergies  Allergen Reactions  . Darvon [Propoxyphene] Anaphylaxis  . Loratadine Anaphylaxis  . Cephalosporins   . Codeine   . Eggs Or Egg-Derived Products   . Paroxetine   . Penicillins   . Sulfa Antibiotics   . Xanax  [Alprazolam]   . Percocet [Oxycodone-Acetaminophen] Itching, Rash and Other (See Comments)    wheezing    Current Outpatient Prescriptions  Medication Sig Dispense Refill  . Calcium Carb-Cholecalciferol 600-100 MG-UNIT CAPS Take 1 tablet by mouth daily.    . furosemide (LASIX) 20 MG tablet Take 1 tablet (20 mg total) by mouth daily. 90 tablet 1  . latanoprost (XALATAN) 0.005 % ophthalmic solution Place 1 drop into both eyes at bedtime.    . potassium chloride SA (K-DUR,KLOR-CON) 20 MEQ tablet Take 1 tablet (20 mEq total) by mouth 4 (four) times daily. 360 tablet 3  . simvastatin (ZOCOR) 40 MG tablet Take 1 tablet (40 mg total) by mouth daily. for cholesterol 90 tablet 1  . spironolactone (ALDACTONE) 100 MG tablet take 1 tablet by mouth once daily 90 tablet 1   No current facility-administered medications for this visit.     Review of  Systems Review of Systems  Constitutional: Negative.   Respiratory: Negative.   Cardiovascular: Negative.     Blood pressure 134/60, pulse 94, resp. rate 14, height 5' 4"  (1.626 m), weight 161 lb (73 kg).  Physical Exam Physical Exam  Constitutional: She appears well-developed and well-nourished.  Eyes: No scleral icterus.  Pulmonary/Chest: Right breast exhibits no inverted nipple, no mass, no nipple discharge, no skin change and no tenderness. Left breast exhibits no inverted nipple, no mass, no nipple discharge, no skin change and no tenderness.    Lymphadenopathy:    She has no cervical adenopathy.    She has no axillary adenopathy.    Data Reviewed Ultrasound examination of the left breast was undertaken in the area of the periareolar erythema. There is marked dermal thickening with the skin almost twice as thick  as the normal tissue outside the field of erythema. Dense shadowing consistent with calcification of the underlying tissue is noted near this area. BI-RADS-3.  Assessment    Faint dermal erythema likely secondary to underlying fat necrosis.    Plan    I recommended a punch biopsy to confirm that there is no evidence of recurrent malignancy. The patient was amenable to proceed and 8 mL of 0.5% Xylocaine with 0.25% Marcaine with 1-200,000 of epinephrine was utilized well tolerated. ChloraPrep was applied to the skin. A single 2.5 mm punch biopsy was obtained from the area of the 1:00 position at the edge of the areola. Scant bleeding was noted. Thick dermal tissue noted at consistent with ultrasound findings. Skin defect was closed with a single 4-0 Prolene suture. Telfa and Tegaderm dressing applied.  The patient will return in one week for suture removal and will be contacted when pathology is available.     Assuming benign results we'll arrange for a follow-up exam and mammograms and December 2018.   The patient is aware to call back for any questions or new concerns.   This information has been scribed by [name] RN, BSN,BC.  Robert Bellow 12/10/2016, 12:22 PM

## 2016-12-14 ENCOUNTER — Ambulatory Visit (INDEPENDENT_AMBULATORY_CARE_PROVIDER_SITE_OTHER): Payer: PPO | Admitting: *Deleted

## 2016-12-14 DIAGNOSIS — N6459 Other signs and symptoms in breast: Secondary | ICD-10-CM

## 2016-12-14 NOTE — Patient Instructions (Signed)
The patient is aware to call back for any questions or concerns.  

## 2016-12-14 NOTE — Progress Notes (Signed)
Patient ID: Leslie Patel, female   DOB: 08-Jun-1941, 76 y.o.   MRN: [id number] Patient came in today for a wound check/suture removal.  The wound is clean, with no signs of infection noted, aware of pathology. Follow up as scheduled.

## 2017-01-04 ENCOUNTER — Encounter: Payer: Self-pay | Admitting: Family Medicine

## 2017-01-04 ENCOUNTER — Ambulatory Visit (INDEPENDENT_AMBULATORY_CARE_PROVIDER_SITE_OTHER): Payer: PPO | Admitting: Family Medicine

## 2017-01-04 VITALS — BP 138/52 | HR 103 | Temp 97.9°F | Resp 16 | Ht 62.0 in | Wt 162.3 lb

## 2017-01-04 DIAGNOSIS — D692 Other nonthrombocytopenic purpura: Secondary | ICD-10-CM | POA: Diagnosis not present

## 2017-01-04 DIAGNOSIS — M810 Age-related osteoporosis without current pathological fracture: Secondary | ICD-10-CM

## 2017-01-04 DIAGNOSIS — E785 Hyperlipidemia, unspecified: Secondary | ICD-10-CM | POA: Diagnosis not present

## 2017-01-04 DIAGNOSIS — E559 Vitamin D deficiency, unspecified: Secondary | ICD-10-CM | POA: Diagnosis not present

## 2017-01-04 DIAGNOSIS — I10 Essential (primary) hypertension: Secondary | ICD-10-CM

## 2017-01-04 DIAGNOSIS — F5101 Primary insomnia: Secondary | ICD-10-CM

## 2017-01-04 DIAGNOSIS — Z0000 Encounter for general adult medical examination without abnormal findings: Secondary | ICD-10-CM | POA: Diagnosis not present

## 2017-01-04 DIAGNOSIS — R739 Hyperglycemia, unspecified: Secondary | ICD-10-CM

## 2017-01-04 MED ORDER — SIMVASTATIN 40 MG PO TABS: 40.0000 mg | ORAL_TABLET | Freq: Every day | ORAL | 1 refills | Status: DC

## 2017-01-04 MED ORDER — FUROSEMIDE 20 MG PO TABS: 20.0000 mg | ORAL_TABLET | Freq: Every day | ORAL | 1 refills | Status: DC

## 2017-01-04 MED ORDER — POTASSIUM CHLORIDE CRYS ER 20 MEQ PO TBCR: 20.0000 meq | EXTENDED_RELEASE_TABLET | Freq: Four times a day (QID) | ORAL | 3 refills | Status: DC

## 2017-01-04 NOTE — Progress Notes (Signed)
Name: Leslie Patel   MRN: [id number]    DOB: April 18, 1941   Date:01/04/2017       Progress Note  Subjective  Chief Complaint  Chief Complaint  Patient presents with  . Annual Exam    HPI   Functional ability/safety issues: No Issues Hearing issues: Addressed  Activities of daily living: Discussed Home safety issues: No Issues  End Of Life Planning: Offered verbal information regarding advanced directives, healthcare power of attorney. She refuses  Preventative care, Health maintenance, Preventative health measures discussed.  Preventative screenings discussed today: lab work, colonoscopy, PAP - not indicated  mammogram, DEXA - refuses therapy   Low Dose CT Chest recommended if Age 11-80 years, 30 pack-year currently smoking OR have quit w/in 15years. She is not interested  Lifestyle risk factor issued reviewed: Diet, exercise, weight management, advised patient smoking is not healthy, nutrition/diet.  Preventative health measures discussed (5-10 year plan).  Reviewed and recommended vaccinations: - Pneumovax -refused - Prevnar -refused - Annual Influenza -refused - but also egg allergy - Zostavax -history of shingles - Tdap - up to date  Depression screening: Done Fall risk screening: Done Discuss ADLs/IADLs: Done  Current medical providers: See HPI  Other health risk factors identified this visit: No other issues Cognitive impairment issues: None identified  All above discussed with patient. Appropriate education, counseling and referral will be made based upon the above.   HTN: she takes Spironolactone, Lasix and potassium for many years. She has a history of severe hypokalemia, and used to take 10 potassium pills daily, but is doing well with current regiment. She is afraid of stopping medications. Discussed other options but she is not interested. Discussed causes of hypokalemia but he does not want to be checked for it..   Hyperlipidemia: taking  Simvastatin, discussed changing to Atorvastatin but she is afraid of changing therapy. No myalgias, no chest pain or decrease in exercise tolerance.  Osteoporosis: she is having a lot of teeth procedures and is not a candidate for bisphosphonate, discussed referral to Rheumatologist and consideration to Prolia or Forteo. She is not interested at this time  Metabolic Syndrome/hyperglycemia: she denies polyphagia, polydipsia or polyuria. Last hgbA1C was 5.6 % - improved   History of breast cancer: recently had a repeat biopsy, but it was negative, scar tissue  Patient Active Problem List   Diagnosis Date Noted  . History of breast cancer 10/22/2015  . Senile purpura ([place]) 07/08/2015  . History of dental problems 07/08/2015  . Benign essential HTN 07/04/2015  . Dyslipidemia 07/04/2015  . Personal history of malignant neoplasm of breast 07/04/2015  . H/O: rheumatic fever 07/04/2015  . Insomnia 07/04/2015  . Dysmetabolic syndrome [id number]  . OP (osteoporosis) 07/04/2015  . Allergic rhinitis, seasonal 07/04/2015  . Vitamin D deficiency 07/04/2015  . Tobacco use 07/04/2015    Past Surgical History:  Procedure Laterality Date  . ABDOMINAL HYSTERECTOMY    . BREAST BIOPSY Left 1991,2006  . BREAST BIOPSY Right 10/22/2014   Fibrocystic changes, pseudo-angiomatous stromal hyperplasia.  Marland Kitchen BREAST LUMPECTOMY  09/2005  . BREAST SURGERY Left 2006   lumpectomy  . CATARACT EXTRACTION, BILATERAL     Left-07/2014 Right-06-25-14  . fracture left arm     Repair  . PORT-A-CATH REMOVAL  11/2007  . PORTACATH PLACEMENT  2006    Family History  Problem Relation Age of Onset  . Tuberculosis Mother   . Cancer Father     leukemia  . Cancer Daughter     Breast  .  Diabetes Daughter   . Rheum arthritis Daughter   . Obesity Daughter   . Breast cancer Daughter   . Other Brother     spinal meningitis    Social History   Social History  . Marital status: Widowed    Spouse name: N/A  .  Number of children: N/A  . Years of education: N/A   Occupational History  . Not on file.   Social History Main Topics  . Smoking status: Former Research scientist (life sciences)  . Smokeless tobacco: Never Used  . Alcohol use No  . Drug use: No  . Sexual activity: Not Currently   Other Topics Concern  . Not on file   Social History Narrative  . No narrative on file     Current Outpatient Prescriptions:  .  Calcium Carb-Cholecalciferol 600-100 MG-UNIT CAPS, Take 1 tablet by mouth daily., Disp: , Rfl:  .  furosemide (LASIX) 20 MG tablet, Take 1 tablet (20 mg total) by mouth daily., Disp: 90 tablet, Rfl: 1 .  latanoprost (XALATAN) 0.005 % ophthalmic solution, Place 1 drop into both eyes at bedtime., Disp: , Rfl:  .  potassium chloride SA (K-DUR,KLOR-CON) 20 MEQ tablet, Take 1 tablet (20 mEq total) by mouth 4 (four) times daily., Disp: 360 tablet, Rfl: 3 .  simvastatin (ZOCOR) 40 MG tablet, Take 1 tablet (40 mg total) by mouth daily. for cholesterol, Disp: 90 tablet, Rfl: 1 .  spironolactone (ALDACTONE) 100 MG tablet, take 1 tablet by mouth once daily, Disp: 90 tablet, Rfl: 1  Allergies  Allergen Reactions  . Darvon [Propoxyphene] Anaphylaxis  . Loratadine Anaphylaxis  . Cephalosporins   . Codeine   . Eggs Or Egg-Derived Products   . Paroxetine   . Penicillins   . Sulfa Antibiotics   . Xanax  [Alprazolam]   . Percocet [Oxycodone-Acetaminophen] Itching, Rash and Other (See Comments)    wheezing     ROS  Constitutional: Negative for fever or weight change.  Respiratory: Negative for cough and shortness of breath.   Cardiovascular: Negative for chest pain or palpitations.  Gastrointestinal: Negative for abdominal pain, no bowel changes.  Musculoskeletal: Negative for gait problem or joint swelling.  Skin: Negative for rash.  Neurological: Negative for dizziness or headache.  No other specific complaints in a complete review of systems (except as listed in HPI above).  Objective  Vitals:    01/04/17 1401  BP: (!) 138/52  Pulse: (!) 103  Resp: 16  Temp: 97.9 F (36.6 C)  SpO2: 97%  Weight: 162 lb 5 oz (73.6 kg)  Height: 5\' 2"  (1.575 m)    Body mass index is 29.69 kg/m.  Physical Exam  Constitutional: Patient appears well-developed and well-nourished. No distress.  HENT: Head: Normocephalic and atraumatic. Ears: B TMs ok, no erythema or effusion; Nose: Nose normal. Mouth/Throat: Oropharynx is clear and moist. No oropharyngeal exudate.  Eyes: Conjunctivae and EOM are normal. Pupils are equal, round, and reactive to light. No scleral icterus.  Neck: Normal range of motion. Neck supple. No JVD present. No thyromegaly present.  Cardiovascular: Normal rate, regular rhythm and normal heart sounds.  No murmur heard. No BLE edema. Pulmonary/Chest: Effort normal and breath sounds normal. No respiratory distress. Abdominal: Soft. Bowel sounds are normal, no distension. There is no tenderness. no masses Breast: seen by Dr. Fleet Contras - not done today  FEMALE GENITALIA: Not done RECTAL: not done Musculoskeletal: Normal range of motion, no joint effusions. No gross deformities Neurological: he is alert and oriented to person,  place, and time. No cranial nerve deficit. Coordination, balance, strength, speech and gait are normal.  Skin: Skin is warm and dry. Senile purpura on arms Psychiatric: Patient has a normal mood and affect. behavior is normal. Judgment and thought content normal.  PHQ2/9: Depression screen [facility] 2/9 01/04/2017 07/06/2016 01/02/2016 07/08/2015  Decreased Interest 0 0 0 0  Down, Depressed, Hopeless 0 0 0 0  PHQ - 2 Score 0 0 0 0     Fall Risk: Fall Risk  01/04/2017 07/06/2016 01/02/2016 07/08/2015  Falls in the past year? No No No No    Functional Status Survey: Is the patient deaf or have difficulty hearing?: No Does the patient have difficulty seeing, even when wearing glasses/contacts?: No Does the patient have difficulty concentrating, remembering, or making  decisions?: No Does the patient have difficulty walking or climbing stairs?: Yes Does the patient have difficulty dressing or bathing?: No Does the patient have difficulty doing errands alone such as visiting a doctor's office or shopping?: No    Assessment & Plan  1. Medicare annual wellness visit, subsequent  Discussed importance of 150 minutes of physical activity weekly, eat two servings of fish weekly, eat one serving of tree nuts ( cashews, pistachios, pecans, almonds.Marland Kitchen) every other day, eat 6 servings of fruit/vegetables daily and drink plenty of water and avoid sweet beverages.   2. 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 Benign essential HTN  - potassium chloride SA (K-DUR,KLOR-CON) 20 MEQ tablet; Take 1 tablet (20 mEq total) by mouth 4 (four) times daily.  Dispense: 360 tablet; Refill: 3 - furosemide (LASIX) 20 MG tablet; Take 1 tablet (20 mg total) by mouth daily.  Dispense: 90 tablet; Refill: 1  4. Primary insomnia  Stable -  She does not want to take anything to sleep, discussed Melatonin   5. Dyslipidemia  - simvastatin (ZOCOR) 40 MG tablet; Take 1 tablet (40 mg total) by mouth daily. for cholesterol  Dispense: 90 tablet; Refill: 1  6. Vitamin D deficiency  Continue otc supplementation  7. Hyperglycemia  Improved with life style modification  8. Age-related osteoporosis without current pathological fracture  She does not want medication

## 2017-02-10 DIAGNOSIS — H40153 Residual stage of open-angle glaucoma, bilateral: Secondary | ICD-10-CM | POA: Diagnosis not present

## 2017-05-02 IMAGING — US US BREAST LTD UNI RIGHT INC AXILLA
1 series · 1 of 1 positions shown · non-contrast
Comparison: 10/08/2014 and earlier

CLINICAL DATA: Six-month follow-up for right breast. Patient had
ultrasound-guided core biopsy of right breast nodule by Dr. Motoya,
showing fibrocystic changes with calcifications, pseudo angiomatous
stromal hyperplasia.

EXAM:
DIGITAL DIAGNOSTIC RIGHT MAMMOGRAM WITH 3D TOMOSYNTHESIS WITH CAD
ULTRASOUND RIGHT BREAST

[Series 1: us breast ltd uni right inc axilla · 0.08mm/px · 1 of 1 slices shown]
[im 1/1]
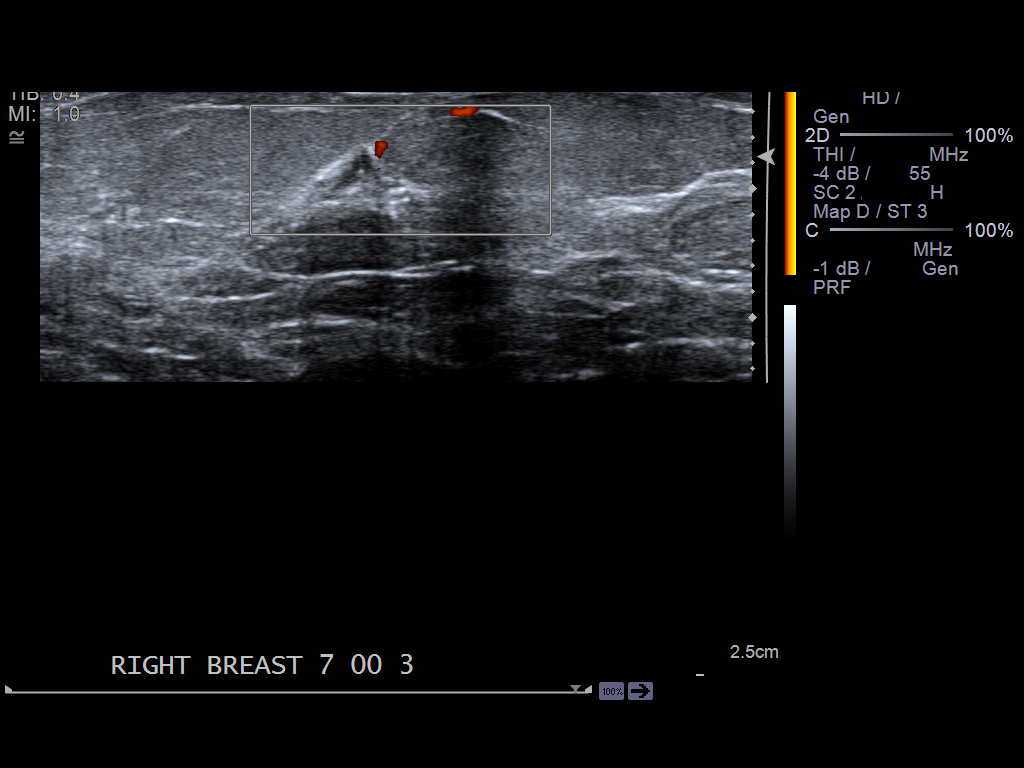

[1 of 1 positions shown; findings below may reference images not displayed]

ACR Breast Density Category b: There are scattered areas of
fibroglandular density.
FINDINGS: Small irregular mass in the lower central portion of the right
breast appears less dense today. Immediately anterior to this mass
there is a tissue marker clip following recent ultrasound-guided
core biopsy.

Mammographic images were processed with CAD.

On physical exam, I palpate no abnormality in the lower central
portion of the right breast.

Targeted ultrasound is performed, showing small irregular hypoechoic
nodule in the 7 o'clock location of the right breast 3 cm from the
nipple. This measures 0.7 x 0.7 x 0.5 cm. Tissue marker clip is not
sonographically visible. A BB is placed over the sonographic finding
and spot tangential views are performed in the oblique and
craniocaudal projections, confirming that the clip and mammographic
finding correlate with the sonographic abnormality.
IMPRESSION: 1.  No mammographic evidence for malignancy.
2. I feel that the lesion in the lower central portion of the right
breast has been sampled based on the additional images performed
today. Given the benign histology, no further evaluation is
recommended for this specific finding.

RECOMMENDATION:
Bilateral screening mammogram is recommended in October 2015.

I have discussed the findings and recommendations with the patient.
Results were also provided in writing at the conclusion of the
visit. If applicable, a reminder letter will be sent to the patient
regarding the next appointment.

BI-RADS CATEGORY  2: Benign.

## 2017-05-31 ENCOUNTER — Other Ambulatory Visit: Payer: Self-pay | Admitting: Family Medicine

## 2017-06-01 NOTE — Telephone Encounter (Signed)
Patient requesting refill of Spironolactone to [facility].

## 2017-06-01 DEATH — deceased

## 2017-06-09 DIAGNOSIS — H40153 Residual stage of open-angle glaucoma, bilateral: Secondary | ICD-10-CM | POA: Diagnosis not present

## 2017-07-08 ENCOUNTER — Other Ambulatory Visit: Payer: Self-pay | Admitting: Family Medicine

## 2017-07-08 ENCOUNTER — Ambulatory Visit (INDEPENDENT_AMBULATORY_CARE_PROVIDER_SITE_OTHER): Payer: PPO | Admitting: Family Medicine

## 2017-07-08 ENCOUNTER — Encounter: Payer: Self-pay | Admitting: Family Medicine

## 2017-07-08 VITALS — BP 134/56 | HR 106 | Temp 98.2°F | Resp 16 | Ht 62.0 in | Wt 161.7 lb

## 2017-07-08 DIAGNOSIS — E559 Vitamin D deficiency, unspecified: Secondary | ICD-10-CM

## 2017-07-08 DIAGNOSIS — E785 Hyperlipidemia, unspecified: Secondary | ICD-10-CM

## 2017-07-08 DIAGNOSIS — M810 Age-related osteoporosis without current pathological fracture: Secondary | ICD-10-CM

## 2017-07-08 DIAGNOSIS — D692 Other nonthrombocytopenic purpura: Secondary | ICD-10-CM

## 2017-07-08 DIAGNOSIS — I10 Essential (primary) hypertension: Secondary | ICD-10-CM

## 2017-07-08 DIAGNOSIS — R739 Hyperglycemia, unspecified: Secondary | ICD-10-CM | POA: Diagnosis not present

## 2017-07-08 DIAGNOSIS — E876 Hypokalemia: Secondary | ICD-10-CM | POA: Diagnosis not present

## 2017-07-08 DIAGNOSIS — Z853 Personal history of malignant neoplasm of breast: Secondary | ICD-10-CM

## 2017-07-08 DIAGNOSIS — Z23 Encounter for immunization: Secondary | ICD-10-CM | POA: Diagnosis not present

## 2017-07-08 MED ORDER — POTASSIUM CHLORIDE CRYS ER 20 MEQ PO TBCR: 20.0000 meq | EXTENDED_RELEASE_TABLET | Freq: Four times a day (QID) | ORAL | 3 refills | Status: DC

## 2017-07-08 MED ORDER — SPIRONOLACTONE 100 MG PO TABS: 100.0000 mg | ORAL_TABLET | Freq: Every day | ORAL | 1 refills | Status: DC

## 2017-07-08 MED ORDER — SIMVASTATIN 40 MG PO TABS: 40.0000 mg | ORAL_TABLET | Freq: Every day | ORAL | 1 refills | Status: DC

## 2017-07-08 MED ORDER — FUROSEMIDE 20 MG PO TABS: 20.0000 mg | ORAL_TABLET | Freq: Every day | ORAL | 1 refills | Status: DC

## 2017-07-08 NOTE — Progress Notes (Signed)
Name: Leslie Patel   MRN: [id number]    DOB: 10/10/1941   Date:07/08/2017       Progress Note  Subjective  Chief Complaint  Chief Complaint  Patient presents with  . Medication Refill    6 month F/U  . Hypertension    Denies any symptoms  . Hyperlipidemia  . Osteoporosis    HPI   HTN: she takes Spironolactone, Lasix and potassium for many years. She has a history of severe hypokalemia, and used to take 10 potassium pills daily, but is doing well with current regiment. She is afraid of stopping medications. Discussed other options but she is not interested. Discussed causes of hypokalemia but he does not want to be checked for it. She states she was admitted initially and multiple tests were done 12 years ago.   Hyperlipidemia: taking Simvastatin, discussed changing to Atorvastatin but she is afraid of changing therapy. No myalgias, no chest pain or decrease in exercise tolerance.  Osteoporosis: she is having a lot of teeth procedures and is not a candidate for bisphosphonate, discussed referral to Rheumatologist and consideration to Prolia or Forteo. She still refuses referral.  Metabolic Syndrome/hyperglycemia: she denies polyphagia, polydipsia or polyuria. Last hgbA1C was 5.6 % - and has been improving over the years, she states she does not care for sweets.   History of breast cancer: recently had a repeat biopsy, but it was negative, scar tissue. She has mammogram's yearly  Stress: she is helping care for 4 great-grandchildren, because her grand-daughter is on kidney transplant list and her grand-daughter's husband was admitted and had multiple complications, still at [facility] under care. She needs to support them financially and emotionally, transporting children to school. She states she is feeling tired but does not mind helping them out. She also states a lot of her friends have sick lately   Patient Active Problem List   Diagnosis Date Noted  . History of breast cancer  10/22/2015  . Senile purpura ([place]) 07/08/2015  . History of dental problems 07/08/2015  . Benign essential HTN 07/04/2015  . Dyslipidemia 07/04/2015  . Personal history of malignant neoplasm of breast 07/04/2015  . H/O: rheumatic fever 07/04/2015  . Insomnia 07/04/2015  . Dysmetabolic syndrome 00/76/2263  . OP (osteoporosis) 07/04/2015  . Allergic rhinitis, seasonal 07/04/2015  . Vitamin D deficiency 07/04/2015  . Tobacco use 07/04/2015    Past Surgical History:  Procedure Laterality Date  . ABDOMINAL HYSTERECTOMY    . BREAST BIOPSY Left 1991,2006  . BREAST BIOPSY Right 10/22/2014   Fibrocystic changes, pseudo-angiomatous stromal hyperplasia.  Marland Kitchen BREAST LUMPECTOMY  09/2005  . BREAST SURGERY Left 2006   lumpectomy  . CATARACT EXTRACTION, BILATERAL     Left-07/2014 Right-06-25-14  . fracture left arm     Repair  . PORT-A-CATH REMOVAL  11/2007  . PORTACATH PLACEMENT  2006    Family History  Problem Relation Age of Onset  . Tuberculosis Mother   . Cancer Father        leukemia  . Cancer Daughter        Breast  . Diabetes Daughter   . Rheum arthritis Daughter   . Obesity Daughter   . Breast cancer Daughter   . Other Brother        spinal meningitis    Social History   Social History  . Marital status: Widowed    Spouse name: N/A  . Number of children: N/A  . Years of education: N/A   Occupational History  .  Not on file.   Social History Main Topics  . Smoking status: Former Research scientist (life sciences)  . Smokeless tobacco: Never Used  . Alcohol use No  . Drug use: No  . Sexual activity: Not Currently   Other Topics Concern  . Not on file   Social History Narrative  . No narrative on file     Current Outpatient Prescriptions:  .  Calcium Carb-Cholecalciferol 600-100 MG-UNIT CAPS, Take 1 tablet by mouth daily., Disp: , Rfl:  .  furosemide (LASIX) 20 MG tablet, Take 1 tablet (20 mg total) by mouth daily., Disp: 90 tablet, Rfl: 1 .  latanoprost (XALATAN) 0.005 % ophthalmic  solution, Place 1 drop into both eyes at bedtime., Disp: , Rfl:  .  potassium chloride SA (K-DUR,KLOR-CON) 20 MEQ tablet, Take 1 tablet (20 mEq total) by mouth 4 (four) times daily., Disp: 360 tablet, Rfl: 3 .  simvastatin (ZOCOR) 40 MG tablet, Take 1 tablet (40 mg total) by mouth daily. for cholesterol, Disp: 90 tablet, Rfl: 1 .  spironolactone (ALDACTONE) 100 MG tablet, Take 1 tablet (100 mg total) by mouth daily., Disp: 90 tablet, Rfl: 1  Allergies  Allergen Reactions  . Darvon [Propoxyphene] Anaphylaxis  . Loratadine Anaphylaxis  . Cephalosporins   . Codeine   . Eggs Or Egg-Derived Products   . Paroxetine   . Penicillins   . Sulfa Antibiotics   . Xanax  [Alprazolam]   . Percocet [Oxycodone-Acetaminophen] Itching, Rash and Other (See Comments)    wheezing     ROS  Constitutional: Negative for fever or weight change.  Respiratory: Negative for cough and shortness of breath.   Cardiovascular: Negative for chest pain or palpitations.  Gastrointestinal: Negative for abdominal pain, no bowel changes.  Musculoskeletal: Negative for gait problem or joint swelling.  Skin: Negative for rash.  Neurological: Negative for dizziness or headache.  No other specific complaints in a complete review of systems (except as listed in HPI above).  Objective  Vitals:   07/08/17 1330  BP: (!) 134/56  Pulse: (!) 106  Resp: 16  Temp: 98.2 F (36.8 C)  TempSrc: Oral  SpO2: 97%  Weight: 161 lb 11.2 oz (73.3 kg)  Height: 5\' 2"  (1.575 m)    Body mass index is 29.58 kg/m.  Physical Exam  Constitutional: Patient appears well-developed and well-nourished. Overweight No distress.  HEENT: head atraumatic, normocephalic, pupils equal and reactive to light, neck supple, throat within normal limits Cardiovascular: Normal rate, regular rhythm and normal heart sounds.  No murmur heard. No BLE edema. Pulmonary/Chest: Effort normal and breath sounds normal. No respiratory distress. Abdominal: Soft.   There is no tenderness. Psychiatric: Patient has a normal mood and affect. behavior is normal. Judgment and thought content normal.  PHQ2/9: Depression screen [facility] 2/9 07/08/2017 01/04/2017 07/06/2016 01/02/2016 07/08/2015  Decreased Interest 0 0 0 0 0  Down, Depressed, Hopeless 0 0 0 0 0  PHQ - 2 Score 0 0 0 0 0     Fall Risk: Fall Risk  07/08/2017 01/04/2017 07/06/2016 01/02/2016 07/08/2015  Falls in the past year? No No No No No     Functional Status Survey: Is the patient deaf or have difficulty hearing?: No Does the patient have difficulty seeing, even when wearing glasses/contacts?: No Does the patient have difficulty concentrating, remembering, or making decisions?: No Does the patient have difficulty walking or climbing stairs?: No Does the patient have difficulty dressing or bathing?: No Does the patient have difficulty doing errands alone such as visiting  a doctor's office or shopping?: No    Assessment & Plan  1. Senile purpura ([facility])  Stable, on left upper extremity   2. Need for vaccination for pneumococcus  refused  3. Needs flu shot  refused  4. Dyslipidemia  - simvastatin (ZOCOR) 40 MG tablet; Take 1 tablet (40 mg total) by mouth daily. for cholesterol  Dispense: 90 tablet; Refill: 1 - Lipid panel  5. Benign essential HTN  - furosemide (LASIX) 20 MG tablet; Take 1 tablet (20 mg total) by mouth daily.  Dispense: 90 tablet; Refill: 1 - spironolactone (ALDACTONE) 100 MG tablet; Take 1 tablet (100 mg total) by mouth daily.  Dispense: 90 tablet; Refill: 1 - potassium chloride SA (K-DUR,KLOR-CON) 20 MEQ tablet; Take 1 tablet (20 mEq total) by mouth 4 (four) times daily.  Dispense: 360 tablet; Refill: 3 - COMPLETE METABOLIC PANEL WITH GFR - CBC with Differential/Platelet  6. Hyperglycemia  - Hemoglobin A1c  7. Age-related osteoporosis without current pathological fracture  Continue calcium and vitamin D   8. Vitamin D deficiency  - VITAMIN D 25 Hydroxy (Vit-D  Deficiency, Fractures)  9. History of breast cancer  Up to date with mammogram   10. Hypokalemia  She states she has been checked for etiology in the past and does not want to have extra labs done at this time

## 2017-07-09 LAB — COMPREHENSIVE METABOLIC PANEL
A/G RATIO: 2 (ref 1.2–2.2)
ALBUMIN: 4.6 g/dL (ref 3.5–4.8)
ALT: 25 IU/L (ref 0–32)
AST: 32 IU/L (ref 0–40)
Alkaline Phosphatase: 99 IU/L (ref 39–117)
BUN / CREAT RATIO: 12 (ref 12–28)
BUN: 11 mg/dL (ref 8–27)
Bilirubin Total: 0.5 mg/dL (ref 0.0–1.2)
CALCIUM: 10 mg/dL (ref 8.7–10.3)
CO2: 22 mmol/L (ref 20–29)
Chloride: 97 mmol/L (ref 96–106)
Creatinine, Ser: 0.94 mg/dL (ref 0.57–1.00)
GFR, EST AFRICAN AMERICAN: 68 mL/min/{1.73_m2} (ref >59)
GFR, EST NON AFRICAN AMERICAN: 59 mL/min/{1.73_m2} — AB (ref >59)
GLOBULIN, TOTAL: 2.3 g/dL (ref 1.5–4.5)
Glucose: 103 mg/dL — ABNORMAL HIGH (ref 65–99)
Potassium: 5 mmol/L (ref 3.5–5.2)
SODIUM: 134 mmol/L (ref 134–144)
Total Protein: 6.9 g/dL (ref 6.0–8.5)

## 2017-07-09 LAB — LIPID PANEL W/O CHOL/HDL RATIO
Cholesterol, Total: 137 mg/dL (ref 100–199)
HDL: 62 mg/dL (ref >39)
LDL CALC: 54 mg/dL (ref 0–99)
Triglycerides: 103 mg/dL (ref 0–149)
VLDL Cholesterol Cal: 21 mg/dL (ref 5–40)

## 2017-07-09 LAB — CBC WITH DIFFERENTIAL/PLATELET
BASOS: 0 %
Basophils Absolute: 0 10*3/uL (ref 0.0–0.2)
EOS (ABSOLUTE): 0.1 10*3/uL (ref 0.0–0.4)
EOS: 1 %
HEMATOCRIT: 42.3 % (ref 34.0–46.6)
HEMOGLOBIN: 14.1 g/dL (ref 11.1–15.9)
IMMATURE GRANULOCYTES: 0 %
Immature Grans (Abs): 0 10*3/uL (ref 0.0–0.1)
LYMPHS ABS: 3.6 10*3/uL — AB (ref 0.7–3.1)
Lymphs: 44 %
MCH: 33.7 pg — ABNORMAL HIGH (ref 26.6–33.0)
MCHC: 33.3 g/dL (ref 31.5–35.7)
MCV: 101 fL — AB (ref 79–97)
MONOCYTES: 6 %
Monocytes Absolute: 0.5 10*3/uL (ref 0.1–0.9)
Neutrophils Absolute: 4 10*3/uL (ref 1.4–7.0)
Neutrophils: 49 %
Platelets: 163 10*3/uL (ref 150–379)
RBC: 4.19 x10E6/uL (ref 3.77–5.28)
RDW: 13.1 % (ref 12.3–15.4)
WBC: 8.2 10*3/uL (ref 3.4–10.8)

## 2017-07-09 LAB — VITAMIN D 25 HYDROXY (VIT D DEFICIENCY, FRACTURES): VIT D 25 HYDROXY: 40.5 ng/mL (ref 30.0–100.0)

## 2017-07-09 LAB — HGB A1C W/O EAG: HEMOGLOBIN A1C: 5.7 % — AB (ref 4.8–5.6)

## 2017-07-11 ENCOUNTER — Other Ambulatory Visit: Payer: Self-pay | Admitting: Family Medicine

## 2017-07-11 DIAGNOSIS — R7989 Other specified abnormal findings of blood chemistry: Secondary | ICD-10-CM

## 2017-07-15 LAB — METHYLMALONIC ACID, SERUM: METHYLMALONIC ACID: 237 nmol/L (ref 0–378)

## 2017-07-15 LAB — SPECIMEN STATUS REPORT

## 2017-07-18 ENCOUNTER — Encounter: Payer: Self-pay | Admitting: Family Medicine

## 2017-08-23 ENCOUNTER — Other Ambulatory Visit: Payer: Self-pay

## 2017-08-23 DIAGNOSIS — Z1231 Encounter for screening mammogram for malignant neoplasm of breast: Secondary | ICD-10-CM

## 2017-10-04 DIAGNOSIS — H40153 Residual stage of open-angle glaucoma, bilateral: Secondary | ICD-10-CM | POA: Diagnosis not present

## 2017-10-26 ENCOUNTER — Ambulatory Visit: Payer: PPO | Admitting: General Surgery

## 2017-11-08 ENCOUNTER — Ambulatory Visit
Admission: RE | Admit: 2017-11-08 | Discharge: 2017-11-08 | Disposition: A | Discharge status: Home / Self Care | Payer: PPO | Source: Ambulatory Visit | Attending: General Surgery | Admitting: General Surgery

## 2017-11-08 DIAGNOSIS — Z1231 Encounter for screening mammogram for malignant neoplasm of breast: Secondary | ICD-10-CM | POA: Diagnosis not present

## 2017-11-08 DIAGNOSIS — R928 Other abnormal and inconclusive findings on diagnostic imaging of breast: Secondary | ICD-10-CM | POA: Insufficient documentation

## 2017-11-10 ENCOUNTER — Other Ambulatory Visit: Payer: Self-pay | Admitting: General Surgery

## 2017-11-10 DIAGNOSIS — N6489 Other specified disorders of breast: Secondary | ICD-10-CM

## 2017-11-10 DIAGNOSIS — R928 Other abnormal and inconclusive findings on diagnostic imaging of breast: Secondary | ICD-10-CM

## 2017-11-15 ENCOUNTER — Ambulatory Visit (INDEPENDENT_AMBULATORY_CARE_PROVIDER_SITE_OTHER): Payer: PPO | Admitting: General Surgery

## 2017-11-15 ENCOUNTER — Encounter: Payer: Self-pay | Admitting: General Surgery

## 2017-11-15 ENCOUNTER — Inpatient Hospital Stay: Payer: Self-pay

## 2017-11-15 VITALS — BP 142/74 | HR 104 | Resp 16 | Ht 63.5 in | Wt 163.0 lb

## 2017-11-15 DIAGNOSIS — N6313 Unspecified lump in the right breast, lower outer quadrant: Secondary | ICD-10-CM

## 2017-11-15 DIAGNOSIS — N6310 Unspecified lump in the right breast, unspecified quadrant: Secondary | ICD-10-CM

## 2017-11-15 DIAGNOSIS — Z853 Personal history of malignant neoplasm of breast: Secondary | ICD-10-CM | POA: Diagnosis not present

## 2017-11-15 NOTE — Patient Instructions (Addendum)
The patient is aware to call back for any questions or concerns.   call to get report

## 2017-11-15 NOTE — Progress Notes (Signed)
Patient ID: Leslie Patel, female   DOB: 03/26/1941, 76 y.o.   MRN: [id number]  Chief Complaint  Patient presents with  . Follow-up    HPI Leslie Patel is a 76 y.o. female.  who presents for her follow up breast cancer and a breast evaluation. The most recent mammogram was done on 11-08-17.  Patient does perform regular self breast checks and gets regular mammograms done.   No new breast issues. She does not recall any breast injury or trauma.  HPI  Past Medical History:  Diagnosis Date  . Allergy   . Bilateral cataracts   . Breast cancer (HCC) 09/16/2005   Left breast, 2.1 cm histologic grade 2, pT2,N1 (mic),M0. ER 90%, PR 70%, HER-2/neu not amplified.  . Cancer (HCC) 2006   left breast with radiation and chemo  . Dyslipidemia   . Edema   . Head injury 1995  . History of breast cancer   . History of chemotherapy 2006  . History of radiation therapy 2006  . Hx of rheumatic fever   . Hypertension   . Hypokalemia   . Insomnia   . Low blood potassium   . Metabolic syndrome   . Osteoporosis   . Shingles 2006  . Stress at home   . Symptomatic menopausal or female climacteric states   . Vitamin D deficiency     Past Surgical History:  Procedure Laterality Date  . ABDOMINAL HYSTERECTOMY    . BREAST BIOPSY Left 1991,2006  . BREAST BIOPSY Right 10/22/2014   Fibrocystic changes, pseudo-angiomatous stromal hyperplasia.  . BREAST LUMPECTOMY  09/2005  . BREAST SURGERY Left 2006   lumpectomy  . CATARACT EXTRACTION, BILATERAL     Left-07/2014 Right-06-25-14  . fracture left arm     Repair  . PORT-A-CATH REMOVAL  11/2007  . PORTACATH PLACEMENT  2006    Family History  Problem Relation Age of Onset  . Tuberculosis Mother   . Cancer Father        leukemia  . Cancer Daughter        Breast  . Diabetes Daughter   . Rheum arthritis Daughter   . Obesity Daughter   . Breast cancer Daughter   . Other Brother        spinal meningitis    Social History Social History    Tobacco Use  . Smoking status: Current Some Day Smoker  . Smokeless tobacco: Never Used  Substance Use Topics  . Alcohol use: No    Alcohol/week: 0.0 oz  . Drug use: No    Allergies  Allergen Reactions  . Darvon [Propoxyphene] Anaphylaxis  . Loratadine Anaphylaxis  . Cephalosporins   . Codeine   . Eggs Or Egg-Derived Products   . Paroxetine   . Penicillins   . Sulfa Antibiotics   . Xanax  [Alprazolam]   . Percocet [Oxycodone-Acetaminophen] Itching, Rash and Other (See Comments)    wheezing    Current Outpatient Medications  Medication Sig Dispense Refill  . Calcium Carb-Cholecalciferol 600-100 MG-UNIT CAPS Take 1 tablet by mouth daily.    . furosemide (LASIX) 20 MG tablet Take 1 tablet (20 mg total) by mouth daily. 90 tablet 1  . latanoprost (XALATAN) 0.005 % ophthalmic solution Place 1 drop into both eyes at bedtime.    . potassium chloride SA (K-DUR,KLOR-CON) 20 MEQ tablet Take 1 tablet (20 mEq total) by mouth 4 (four) times daily. 360 tablet 3  . simvastatin (ZOCOR) 40 MG tablet Take 1 tablet (40   mg total) by mouth daily. for cholesterol 90 tablet 1  . spironolactone (ALDACTONE) 100 MG tablet Take 1 tablet (100 mg total) by mouth daily. 90 tablet 1   No current facility-administered medications for this visit.     Review of Systems Review of Systems  Constitutional: Negative.   Respiratory: Negative.   Cardiovascular: Negative.     Blood pressure (!) 142/74, pulse (!) 104, resp. rate 16, height 5' 3.5" (1.613 m), weight 163 lb (73.9 kg), SpO2 98 %.  Physical Exam Physical Exam  Constitutional: She is oriented to person, place, and time. She appears well-developed and well-nourished.  HENT:  Mouth/Throat: Oropharynx is clear and moist.  Eyes: Conjunctivae are normal. No scleral icterus.  Neck: Neck supple.  Cardiovascular: Normal rate, regular rhythm and normal heart sounds.  Pulmonary/Chest: Effort normal and breath sounds normal. Right breast exhibits no  inverted nipple, no mass, no nipple discharge, no skin change and no tenderness. Left breast exhibits no inverted nipple, no mass, no nipple discharge, no skin change and no tenderness.    Well healed incisions left breast.  Lymphadenopathy:    She has no cervical adenopathy.    She has no axillary adenopathy.  Neurological: She is alert and oriented to person, place, and time.  Skin: Skin is warm and dry.  Psychiatric: Her behavior is normal.    Data Reviewed November 09, 2017 screening mammogram suggested a asymmetry in the right breast for which additional views were requested.  BI-RADS-0.  Ultrasound examination of the right breast showed multiple hyperechoic areas without associated acoustic enhancement or shadowing.  These range in size from 0.65-0.45 cm.  These were located at both the 2:00 and 9 o'clock position.  A few small 3 mm cystlike lesions were noted in the 9 o'clock position 2 cm from the nipple.  These areas are suggestive of fat enhancement.  Each area is wider than tall.  The area is identified on today's ultrasound were not appreciated on the multiple right breast ultrasound examinations completed in 2016.  BI-RADS-3.  Assessment    Unremarkable breast exam, new mammographic finding.    Plan    The patient will have her additional views and likely repeat ultrasound at the hospital.  She was encouraged to call the day of her exam so the films can be reviewed and any change in treatment plan discussed.        HPI, Physical Exam, Assessment and Plan have been scribed under the direction and in the presence of Leslie Bellow, MD. Leslie Fetch, RN  I have completed the exam and reviewed the above documentation for accuracy and completeness.  I agree with the above.  Leslie Patel has been used and any errors in dictation or transcription are unintentional.  Leslie Patel, M.D., F.A.C.S.  Leslie Patel Leslie Patel 11/16/2017, 8:05 PM

## 2017-11-17 ENCOUNTER — Ambulatory Visit
Admission: RE | Admit: 2017-11-17 | Discharge: 2017-11-17 | Disposition: A | Discharge status: Home / Self Care | Payer: PPO | Source: Ambulatory Visit | Attending: General Surgery | Admitting: General Surgery

## 2017-11-17 ENCOUNTER — Encounter: Payer: Self-pay | Admitting: *Deleted

## 2017-11-17 DIAGNOSIS — R928 Other abnormal and inconclusive findings on diagnostic imaging of breast: Secondary | ICD-10-CM | POA: Diagnosis not present

## 2017-11-17 DIAGNOSIS — N6489 Other specified disorders of breast: Secondary | ICD-10-CM | POA: Diagnosis not present

## 2017-11-17 HISTORY — DX: Personal history of irradiation: Z92.3

## 2017-11-17 HISTORY — DX: Personal history of antineoplastic chemotherapy: Z92.21

## 2017-11-17 NOTE — Progress Notes (Signed)
Patient came by the office today to report that she just had her right breast additional views at [facility] today.   The patient states she was told to contact our office and remind Dr. Bary Castilla to review her images.

## 2018-01-04 ENCOUNTER — Ambulatory Visit (INDEPENDENT_AMBULATORY_CARE_PROVIDER_SITE_OTHER): Payer: PPO | Admitting: Family Medicine

## 2018-01-04 ENCOUNTER — Encounter: Payer: Self-pay | Admitting: Family Medicine

## 2018-01-04 VITALS — BP 120/50 | HR 104 | Resp 16 | Ht 64.0 in | Wt 162.7 lb

## 2018-01-04 DIAGNOSIS — E559 Vitamin D deficiency, unspecified: Secondary | ICD-10-CM

## 2018-01-04 DIAGNOSIS — R739 Hyperglycemia, unspecified: Secondary | ICD-10-CM

## 2018-01-04 DIAGNOSIS — D692 Other nonthrombocytopenic purpura: Secondary | ICD-10-CM

## 2018-01-04 DIAGNOSIS — I10 Essential (primary) hypertension: Secondary | ICD-10-CM

## 2018-01-04 DIAGNOSIS — E785 Hyperlipidemia, unspecified: Secondary | ICD-10-CM

## 2018-01-04 DIAGNOSIS — E876 Hypokalemia: Secondary | ICD-10-CM | POA: Diagnosis not present

## 2018-01-04 DIAGNOSIS — M810 Age-related osteoporosis without current pathological fracture: Secondary | ICD-10-CM | POA: Diagnosis not present

## 2018-01-04 DIAGNOSIS — E8881 Metabolic syndrome: Secondary | ICD-10-CM

## 2018-01-04 MED ORDER — SIMVASTATIN 40 MG PO TABS: 40.0000 mg | ORAL_TABLET | Freq: Every day | ORAL | 1 refills | Status: DC

## 2018-01-04 MED ORDER — SPIRONOLACTONE 100 MG PO TABS: 100.0000 mg | ORAL_TABLET | Freq: Every day | ORAL | 1 refills | Status: DC

## 2018-01-04 MED ORDER — FUROSEMIDE 20 MG PO TABS: 20.0000 mg | ORAL_TABLET | Freq: Every day | ORAL | 1 refills | Status: DC

## 2018-01-04 MED ORDER — POTASSIUM CHLORIDE CRYS ER 20 MEQ PO TBCR: 20.0000 meq | EXTENDED_RELEASE_TABLET | Freq: Four times a day (QID) | ORAL | 3 refills | Status: DC

## 2018-01-04 NOTE — Progress Notes (Signed)
Name: Leslie Patel   MRN: [id number]    DOB: 1940/11/30   Date:01/04/2018       Progress Note  Subjective  Chief Complaint  Chief Complaint  Patient presents with  . senile  . Medication Refill  . Hypertension    Denies any symptoms  . Hyperlipidemia  . Stress    States she just lost a close friend 02-01-2023 night and her aunt passed away 02-01-23 afternoon.    HPI   HTN: she takes Spironolactone, Lasix and potassium for many years. She has a history of severe hypokalemia, and used to take 10 potassium pills daily, but is doing well with current regiment. She is afraid of stopping medications. Discussed other options but she is not interested. Discussed causes of hypokalemia but he does not want to be checked for it. She states she was admitted initially and multiple tests were done 12 years ago. She does not want any further testing. We will recheck general labs next visit.   Hyperlipidemia: taking Simvastatin, discussed changing to Atorvastatin but she is afraid of changing therapy. No myalgias, no chest pain , palpitation. or decrease in exercise tolerance.  Osteoporosis: she is having a lot of teeth procedures and is not a candidate for bisphosphonate, discussed referral to Endocrinologist and consideration to Prolia or Forteo. She still refuses referral.  Metabolic Syndrome/hyperglycemia: she denies polyphagia, polydipsia or polyuria. Last hgbA1C was 5.7 and stable over the past few years.   History of breast cancer: recently had a repeat biopsy, but it was negative, scar tissue. Mammogram is up to date   Stress: she is helping care for 4 great-grandchildren, because her grand-daughter is on kidney transplant list and her grand-daughter's husband was admitted and had multiple complications, still at [facility] under care. She needs to support them financially and emotionally, transporting children to school. She also lost 2 friends that died recently, going to a funeral today     Patient Active Problem List   Diagnosis Date Noted  . History of breast cancer 10/22/2015  . Senile purpura ([place]) 07/08/2015  . History of dental problems 07/08/2015  . Benign essential HTN 07/04/2015  . Dyslipidemia 07/04/2015  . Personal history of malignant neoplasm of breast 07/04/2015  . H/O: rheumatic fever 07/04/2015  . Insomnia 07/04/2015  . Dysmetabolic syndrome 35/00/9381  . OP (osteoporosis) 07/04/2015  . Allergic rhinitis, seasonal 07/04/2015  . Vitamin D deficiency 07/04/2015  . Tobacco use 07/04/2015    Past Surgical History:  Procedure Laterality Date  . ABDOMINAL HYSTERECTOMY    . BREAST BIOPSY Left 2006   invasive lobular carcinoma  . BREAST BIOPSY Right 10/22/2014   Fibrocystic changes, pseudo-angiomatous stromal hyperplasia.  [facility] BREAST EXCISIONAL BIOPSY Right 1991   benign  . BREAST LUMPECTOMY Left 09/2005   invasive lobular carcinoma clear margins  . BREAST SURGERY Left 2006   lumpectomy  . CATARACT EXTRACTION, BILATERAL     Left-07/2014 Right-06-25-14  . fracture left arm     Repair  . PORT-A-CATH REMOVAL  11/2007  . PORTACATH PLACEMENT  2006    Family History  Problem Relation Age of Onset  . Tuberculosis Mother   . Cancer Father        leukemia  . Cancer Daughter        Breast  . Diabetes Daughter   . Rheum arthritis Daughter   . Obesity Daughter   . Breast cancer Daughter 29  . Other Brother        spinal meningitis  .  Breast cancer Other 70    Social History   Socioeconomic History  . Marital status: Widowed    Spouse name: Not on file  . Number of children: Not on file  . Years of education: Not on file  . Highest education level: Not on file  Social Needs  . Financial resource strain: Not on file  . Food insecurity - worry: Not on file  . Food insecurity - inability: Not on file  . Transportation needs - medical: Not on file  . Transportation needs - non-medical: Not on file  Occupational History  . Not on file   Tobacco Use  . Smoking status: Current Some Day Smoker  . Smokeless tobacco: Never Used  Substance and Sexual Activity  . Alcohol use: No    Alcohol/week: 0.0 oz  . Drug use: No  . Sexual activity: Not Currently  Other Topics Concern  . Not on file  Social History Narrative  . Not on file     Current Outpatient Medications:  .  Calcium Carb-Cholecalciferol 600-100 MG-UNIT CAPS, Take 1 tablet by mouth daily., Disp: , Rfl:  .  furosemide (LASIX) 20 MG tablet, Take 1 tablet (20 mg total) by mouth daily., Disp: 90 tablet, Rfl: 1 .  latanoprost (XALATAN) 0.005 % ophthalmic solution, Place 1 drop into both eyes at bedtime., Disp: , Rfl:  .  potassium chloride SA (K-DUR,KLOR-CON) 20 MEQ tablet, Take 1 tablet (20 mEq total) by mouth 4 (four) times daily., Disp: 360 tablet, Rfl: 3 .  simvastatin (ZOCOR) 40 MG tablet, Take 1 tablet (40 mg total) by mouth daily. for cholesterol, Disp: 90 tablet, Rfl: 1 .  spironolactone (ALDACTONE) 100 MG tablet, Take 1 tablet (100 mg total) by mouth daily., Disp: 90 tablet, Rfl: 1  Allergies  Allergen Reactions  . Darvon [Propoxyphene] Anaphylaxis  . Loratadine Anaphylaxis  . Cephalosporins   . Codeine   . Eggs Or Egg-Derived Products   . Paroxetine   . Penicillins   . Sulfa Antibiotics   . Xanax  [Alprazolam]   . Percocet [Oxycodone-Acetaminophen] Itching, Rash and Other (See Comments)    wheezing     ROS  Constitutional: Negative for fever or weight change.  Respiratory: Negative for cough and shortness of breath.   Cardiovascular: Negative for chest pain or palpitations.  Gastrointestinal: Negative for abdominal pain, no bowel changes.  Musculoskeletal: Negative for gait problem or joint swelling.  Skin: Negative for rash.  Neurological: Negative for dizziness or headache.  No other specific complaints in a complete review of systems (except as listed in HPI above).  Objective  Vitals:   01/04/18 1333  BP: (!) 120/50  Pulse: (!) 104   Resp: 16  SpO2: 98%  Weight: 162 lb 11.2 oz (73.8 kg)  Height: 5\' 4"  (1.626 m)    Body mass index is 27.93 kg/m.  Physical Exam  Constitutional: Patient appears well-developed and well-nourished. Overweight.  No distress.  HEENT: head atraumatic, normocephalic, pupils equal and reactive to light, neck supple, throat within normal limits Cardiovascular: Normal rate, regular rhythm and normal heart sounds.  No murmur heard. No BLE edema. Pulmonary/Chest: Effort normal and breath sounds normal. No respiratory distress. Abdominal: Soft.  There is no tenderness. Psychiatric: Patient has a normal mood and affect. behavior is normal. Judgment and thought content normal.  PHQ2/9: Depression screen [facility] 2/9 07/08/2017 01/04/2017 07/06/2016 01/02/2016 07/08/2015  Decreased Interest 0 0 0 0 0  Down, Depressed, Hopeless 0 0 0 0 0  PHQ -  2 Score 0 0 0 0 0    Fall Risk: Fall Risk  01/04/2018 07/08/2017 01/04/2017 07/06/2016 01/02/2016  Falls in the past year? No No No No No     Functional Status Survey: Is the patient deaf or have difficulty hearing?: No Does the patient have difficulty seeing, even when wearing glasses/contacts?: No Does the patient have difficulty concentrating, remembering, or making decisions?: No Does the patient have difficulty walking or climbing stairs?: No Does the patient have difficulty dressing or bathing?: No Does the patient have difficulty doing errands alone such as visiting a doctor's office or shopping?: No    Assessment & Plan  1. Benign essential HTN  - furosemide (LASIX) 20 MG tablet; Take 1 tablet (20 mg total) by mouth daily.  Dispense: 90 tablet; Refill: 1 - potassium chloride SA (K-DUR,KLOR-CON) 20 MEQ tablet; Take 1 tablet (20 mEq total) by mouth 4 (four) times daily.  Dispense: 360 tablet; Refill: 3 - spironolactone (ALDACTONE) 100 MG tablet; Take 1 tablet (100 mg total) by mouth daily.  Dispense: 90 tablet; Refill: 1  2. Dyslipidemia  - simvastatin  (ZOCOR) 40 MG tablet; Take 1 tablet (40 mg total) by mouth daily. for cholesterol  Dispense: 90 tablet; Refill: 1  3. Hypokalemia  Discussed other tests, but she wants to hold off . She has been taking supplements for over 10 years - potassium chloride SA (K-DUR,KLOR-CON) 20 MEQ tablet; Take 1 tablet (20 mEq total) by mouth 4 (four) times daily.  Dispense: 360 tablet; Refill: 3   4. Senile purpura (HCC)  stable  5. Age-related osteoporosis without current pathological fracture  Discussed prolia, since unable to take fosamax . She does not want any more medications at this time, refuses referral to Endocrinologist   6. Vitamin D deficiency  Needs to continue taking vitamin D supplementation  7. Hyperglycemia  Discussed life style modification again, recheck labs next visit  8. Metabolic syndrome

## 2018-01-09 ENCOUNTER — Telehealth: Payer: Self-pay | Admitting: General Surgery

## 2018-01-09 NOTE — Telephone Encounter (Signed)
Additional views of the left breast completed in January were reviewed. Area of concern posterior to the past biopsy site resolved. BIRAD 1.  No additional intervention required.

## 2018-02-08 DIAGNOSIS — H40153 Residual stage of open-angle glaucoma, bilateral: Secondary | ICD-10-CM | POA: Diagnosis not present

## 2018-05-10 ENCOUNTER — Other Ambulatory Visit: Payer: Self-pay | Admitting: Family Medicine

## 2018-05-10 DIAGNOSIS — I10 Essential (primary) hypertension: Secondary | ICD-10-CM

## 2018-06-06 ENCOUNTER — Encounter: Payer: Self-pay | Admitting: Family Medicine

## 2018-06-06 ENCOUNTER — Ambulatory Visit (INDEPENDENT_AMBULATORY_CARE_PROVIDER_SITE_OTHER): Payer: PPO | Admitting: Family Medicine

## 2018-06-06 VITALS — BP 124/68 | HR 101 | Temp 98.4°F | Resp 16 | Ht 64.0 in | Wt 162.3 lb

## 2018-06-06 DIAGNOSIS — J0140 Acute pansinusitis, unspecified: Secondary | ICD-10-CM | POA: Diagnosis not present

## 2018-06-06 MED ORDER — AZITHROMYCIN 500 MG PO TABS: 500.0000 mg | ORAL_TABLET | Freq: Every day | ORAL | 0 refills | Status: AC

## 2018-06-06 NOTE — Progress Notes (Signed)
Name: Leslie Patel   MRN: [id number]    DOB: 1941/07/18   Date:06/06/2018       Progress Note  Subjective  Chief Complaint  Chief Complaint  Patient presents with  . Sinusitis    for 3 days    HPI  Pt presents with concern for possible sinusitis - 3 days ago she started with sore throat and sinus congestion, then last night she developed significant sinus pain and pressure in bilateral maxillary and frontal sinuses. Endorses rhinorrhea.  She does have long history of breast ca treatment - has been off of chemo for about 2 years.  She also has history of recurrent sinus infections occurring while she was going through treatment, but this is the first episode she has had since she finished cancer treatment.  HR elevated; afebrile.  She does endorse chills.  Denies nausea/abdominal pain, chest pain, shortness of breath, or body aches. - She has several allergies including to cephalosporins, PCN's, and Sulfa antibiotics.  She has done very well on azithromycin in the past; is very wary of trying new abx due to previous reactions.  Patient Active Problem List   Diagnosis Date Noted  . History of breast cancer 10/22/2015  . Senile purpura ([place]) 07/08/2015  . History of dental problems 07/08/2015  . Benign essential HTN 07/04/2015  . Dyslipidemia 07/04/2015  . Personal history of malignant neoplasm of breast 07/04/2015  . H/O: rheumatic fever 07/04/2015  . Insomnia 07/04/2015  . Dysmetabolic syndrome 22/12/5425  . OP (osteoporosis) 07/04/2015  . Allergic rhinitis, seasonal 07/04/2015  . Vitamin D deficiency 07/04/2015  . Tobacco use 07/04/2015    Social History   Tobacco Use  . Smoking status: Current Some Day Smoker  . Smokeless tobacco: Never Used  . Tobacco comment: only stressed and not ready to quit  Substance Use Topics  . Alcohol use: No    Alcohol/week: 0.0 oz     Current Outpatient Medications:  .  Calcium Carb-Cholecalciferol 600-100 MG-UNIT CAPS, Take 1 tablet  by mouth daily., Disp: , Rfl:  .  furosemide (LASIX) 20 MG tablet, Take 1 tablet (20 mg total) by mouth daily., Disp: 90 tablet, Rfl: 1 .  latanoprost (XALATAN) 0.005 % ophthalmic solution, Place 1 drop into both eyes at bedtime., Disp: , Rfl:  .  potassium chloride SA (K-DUR,KLOR-CON) 20 MEQ tablet, Take 1 tablet (20 mEq total) by mouth 4 (four) times daily., Disp: 360 tablet, Rfl: 3 .  simvastatin (ZOCOR) 40 MG tablet, Take 1 tablet (40 mg total) by mouth daily. for cholesterol, Disp: 90 tablet, Rfl: 1 .  spironolactone (ALDACTONE) 100 MG tablet, Take 1 tablet (100 mg total) by mouth daily., Disp: 90 tablet, Rfl: 1  Allergies  Allergen Reactions  . Darvon [Propoxyphene] Anaphylaxis  . Loratadine Anaphylaxis  . Cephalosporins   . Codeine   . Eggs Or Egg-Derived Products   . Paroxetine   . Penicillins   . Sulfa Antibiotics   . Xanax  [Alprazolam]   . Percocet [Oxycodone-Acetaminophen] Itching, Rash and Other (See Comments)    wheezing    ROS  Ten systems reviewed and is negative except as mentioned in HPI.  Objective  Vitals:   06/06/18 1110  BP: 124/68  Pulse: (!) 101  Resp: 16  Temp: 98.4 F (36.9 C)  TempSrc: Oral  SpO2: 96%  Weight: 162 lb 4.8 oz (73.6 kg)  Height: 5\' 4"  (1.626 m)   Elevated HR likely secondary to illness.  Body mass index  is 27.86 kg/m.  Nursing Note and Vital Signs reviewed.  Physical Exam  Constitutional: She is oriented to person, place, and time. She appears well-developed and well-nourished.  HENT:  Head: Normocephalic and atraumatic.  Right Ear: External ear normal.  Left Ear: External ear normal.  Nose: Right sinus exhibits maxillary sinus tenderness and frontal sinus tenderness. Left sinus exhibits maxillary sinus tenderness and frontal sinus tenderness.  Mouth/Throat: Oropharynx is clear and moist and mucous membranes are normal. No oropharyngeal exudate, posterior oropharyngeal edema or posterior oropharyngeal erythema. Tonsils are  0 on the right. Tonsils are 0 on the left. No tonsillar exudate.  Eyes: Pupils are equal, round, and reactive to light. Conjunctivae and EOM are normal. No scleral icterus.  Neck: Normal range of motion. Neck supple.  Cardiovascular: Normal rate, regular rhythm and normal heart sounds.  Pulmonary/Chest: Effort normal and breath sounds normal.  Musculoskeletal: Normal range of motion. She exhibits no edema.  Lymphadenopathy:    She has cervical adenopathy.  Neurological: She is alert and oriented to person, place, and time. No cranial nerve deficit.  Skin: Skin is warm and dry. No rash noted. No erythema.  Psychiatric: She has a normal mood and affect. Her behavior is normal. Judgment and thought content normal.  Nursing note and vitals reviewed.  No results found for this or any previous visit (from the past 72 hour(s)).  Assessment & Plan  1. Acute non-recurrent pansinusitis - Refuses nasal spray; has anaphylaxis reaction to claritin so we will hold on antihistamine treatment. - azithromycin (ZITHROMAX) 500 MG tablet; Take 1 tablet (500 mg total) by mouth daily for 3 days.  Dispense: 3 tablet; Refill: 0 - If not improved in 5 days, return for additional evaluation.  -Red flags and when to present for emergency care or RTC including fever >101.32F, chest pain, shortness of breath, new/worsening/un-resolving symptoms, pain with ocular movement reviewed with patient at time of visit. Follow up and care instructions discussed and provided in AVS.

## 2018-06-06 NOTE — Patient Instructions (Signed)

## 2018-06-09 ENCOUNTER — Ambulatory Visit (INDEPENDENT_AMBULATORY_CARE_PROVIDER_SITE_OTHER): Payer: PPO

## 2018-06-09 VITALS — BP 130/60 | HR 89 | Temp 98.1°F | Resp 12 | Ht 64.0 in | Wt 160.7 lb

## 2018-06-09 DIAGNOSIS — Z0000 Encounter for general adult medical examination without abnormal findings: Secondary | ICD-10-CM | POA: Diagnosis not present

## 2018-06-09 NOTE — Progress Notes (Addendum)
Subjective:   Leslie Patel is a 77 y.o. female who presents for Medicare Annual (Subsequent) preventive examination.  Review of Systems:  N/A Cardiac Risk Factors include: advanced age (>20mn, >>22women);dyslipidemia;sedentary lifestyle;smoking/ tobacco exposure;hypertension     Objective:     Vitals: BP 130/60 (BP Location: Right Arm, Patient Position: Sitting, Cuff Size: Normal)   Pulse 89   Temp 98.1 F (36.7 C) (Oral)   Resp 12   Ht 5' 4"  (1.626 m)   Wt 160 lb 11.2 oz (72.9 kg)   SpO2 95%   BMI 27.58 kg/m   Body mass index is 27.58 kg/m.  Advanced Directives 06/09/2018 07/08/2017 01/04/2017 07/06/2016 01/02/2016 07/08/2015  Does Patient Have a Medical Advance Directive? No No No No No No  Would patient like information on creating a medical advance directive? Yes (MAU/Ambulatory/Procedural Areas - Information given) - - No - patient declined information No - patient declined information -    Tobacco Social History   Tobacco Use  Smoking Status Light Tobacco Smoker  . Packs/day: 0.30  . Years: 30.00  . Pack years: 9.00  . Types: Cigarettes  Smokeless Tobacco Never Used  Tobacco Comment   smokes only when she becones stressed     Ready to quit: No Counseling given: Yes Comment: smokes only when she becones stressed  Clinical Intake:  Pre-visit preparation completed: Yes  Pain : No/denies pain   BMI - recorded: 27.58 Nutritional Status: BMI 25 -29 Overweight Nutritional Risks: None Diabetes: No  How often do you need to have someone help you when you read instructions, pamphlets, or other written materials from your doctor or pharmacy?: 1 - Never  Interpreter Needed?: No  Information entered by :: AEversole, LPN  Past Medical History:  Diagnosis Date  . Allergy   . Bilateral cataracts   . Breast cancer ([facility] 09/16/2005   Left breast, 2.1 cm histologic grade 2, pT2,N1 (mic),M0. ER 90%, PR 70%, HER-2/neu not amplified.  . Cancer ([facility] 2006   left breast  with radiation and chemo  . Dyslipidemia   . Edema   . Head injury 1995  . History of breast cancer   . History of chemotherapy 2006  . History of radiation therapy 2006  . Hx of rheumatic fever   . Hypertension   . Hypokalemia   . Insomnia   . Low blood potassium   . Metabolic syndrome   . Osteoporosis   . Personal history of chemotherapy   . Personal history of radiation therapy   . Shingles 2006  . Stress at home   . Symptomatic menopausal or female climacteric states   . Vitamin D deficiency    Past Surgical History:  Procedure Laterality Date  . ABDOMINAL HYSTERECTOMY    . BREAST BIOPSY Left 2006   invasive lobular carcinoma  . BREAST BIOPSY Right 10/22/2014   Fibrocystic changes, pseudo-angiomatous stromal hyperplasia.  .Marland KitchenBREAST EXCISIONAL BIOPSY Right 1991   benign  . BREAST LUMPECTOMY Left 09/2005   invasive lobular carcinoma clear margins  . BREAST SURGERY Left 2006   lumpectomy  . CATARACT EXTRACTION, BILATERAL     Left-07/2014 Right-06-25-14  . fracture left arm     Repair  . PORT-A-CATH REMOVAL  11/2007  . PORTACATH PLACEMENT  2006   Family History  Problem Relation Age of Onset  . Tuberculosis Mother   . Cancer Father        leukemia  . Cancer Daughter  Breast  . Diabetes Daughter   . Rheum arthritis Daughter   . Obesity Daughter   . Breast cancer Daughter 22  . Other Brother        spinal meningitis  . Breast cancer Other 2   Social History   Socioeconomic History  . Marital status: Widowed    Spouse name: Not on file  . Number of children: 2  . Years of education: some college  . Highest education level: 12th grade  Occupational History    Employer: RETIRED  Social Needs  . Financial resource strain: Not hard at all  . Food insecurity:    Worry: Never true    Inability: Never true  . Transportation needs:    Medical: No    Non-medical: No  Tobacco Use  . Smoking status: Light Tobacco Smoker    Packs/day: 0.30    Years:  30.00    Pack years: 9.00    Types: Cigarettes  . Smokeless tobacco: Never Used  . Tobacco comment: smokes only when she becones stressed  Substance and Sexual Activity  . Alcohol use: No    Alcohol/week: 0.0 standard drinks  . Drug use: No  . Sexual activity: Not Currently  Lifestyle  . Physical activity:    Days per week: 0 days    Minutes per session: 0 min  . Stress: Not at all  Relationships  . Social connections:    Talks on phone: Patient refused    Gets together: Patient refused    Attends religious service: Patient refused    Active member of club or organization: Patient refused    Attends meetings of clubs or organizations: Patient refused    Relationship status: Widowed  Other Topics Concern  . Not on file  Social History Narrative  . Not on file    Outpatient Encounter Medications as of 06/09/2018  Medication Sig  . Calcium Carb-Cholecalciferol 600-100 MG-UNIT CAPS Take 1 tablet by mouth daily.  . furosemide (LASIX) 20 MG tablet Take 1 tablet (20 mg total) by mouth daily.  Marland Kitchen latanoprost (XALATAN) 0.005 % ophthalmic solution Place 1 drop into both eyes at bedtime.  . potassium chloride SA (K-DUR,KLOR-CON) 20 MEQ tablet Take 1 tablet (20 mEq total) by mouth 4 (four) times daily.  . simvastatin (ZOCOR) 40 MG tablet Take 1 tablet (40 mg total) by mouth daily. for cholesterol  . spironolactone (ALDACTONE) 100 MG tablet Take 1 tablet (100 mg total) by mouth daily.  Marland Kitchen azithromycin (ZITHROMAX) 500 MG tablet Take 1 tablet (500 mg total) by mouth daily for 3 days.   No facility-administered encounter medications on file as of 06/09/2018.     Activities of Daily Living In your present state of health, do you have any difficulty performing the following activities: 06/09/2018 01/04/2018  Hearing? N N  Comment denies hearing aids -  Vision? N N  Comment wears eyeglasses; glaucoma -  Difficulty concentrating or making decisions? N N  Walking or climbing stairs? N N  Dressing  or bathing? N N  Doing errands, shopping? N N  Preparing Food and eating ? N -  Comment denies dentures -  Using the Toilet? N -  In the past six months, have you accidently leaked urine? N -  Do you have problems with loss of bowel control? N -  Managing your Medications? N -  Managing your Finances? N -  Housekeeping or managing your Housekeeping? N -  Some recent data might be hidden  Patient Care Team: Steele Sizer, MD as PCP - General (Family Medicine) Bary Castilla Forest Gleason, MD (General Surgery)    Assessment:   This is a routine wellness examination for [place].  Exercise Activities and Dietary recommendations Current Exercise Habits: The patient does not participate in regular exercise at present, Exercise limited by: None identified  Goals    . DIET - INCREASE WATER INTAKE     Recommend to drink at least 6-8 8oz glasses of water per day.       Fall Risk Fall Risk  06/09/2018 06/06/2018 01/04/2018 07/08/2017 01/04/2017  Falls in the past year? No No No No No  Risk for fall due to : Impaired vision;History of fall(s) - - - -  Risk for fall due to: Comment wears eyeglasses - - - -   FALL RISK PREVENTION PERTAINING TO HOME: Is your home free of loose throw rugs in walkways, pet beds, electrical cords, etc? Yes Is there adequate lighting in your home to reduce risk of falls?  Yes Are there stairs in or around your home WITH handrails? No stairs. Has a ramp  ASSISTIVE DEVICES UTILIZED TO PREVENT FALLS: Use of a cane, walker or w/c? No Grab bars in the bathroom? Yes  Shower chair or a place to sit while bathing? No An elevated toilet seat or a handicapped toilet? No  Timed Get Up and Go Performed: Yes. Pt ambulated 10 feet within 10 sec. Gait stead-fast and without the use of an assistive device. No intervention required at this time. Fall risk prevention has been discussed.  Community Resource Referral:  Pt declined my offer to send [employer] Referral to Care  Guide for a shower chair or an elevated toilet seat.  Depression Screen PHQ 2/9 Scores 06/09/2018 06/06/2018 07/08/2017 01/04/2017  PHQ - 2 Score 0 0 0 0  PHQ- 9 Score 0 0 - -     Cognitive Function     6CIT Screen 06/09/2018  What Year? 0 points  What month? 0 points  What time? 0 points  Count back from 20 0 points  Months in reverse 4 points  Repeat phrase 2 points  Total Score 6    Immunization History  Administered Date(s) Administered  . Influenza-Unspecified 08/13/2014  . Pneumococcal Conjugate-13 01/03/2015  . Tdap 05/01/2005, 07/08/2015  . Zoster 12/01/2012    Qualifies for Shingles Vaccine? Yes. Zostavax completed 12/01/12. Due for Shingrix. Education has been provided regarding the importance of this vaccine. Pt has been advised to call insurance company to determine out of pocket expense. Advised may also receive vaccine at local pharmacy or Health Dept. Verbalized acceptance and understanding.  Overdue for Flu vaccine. Education has been provided regarding the importance of this vaccine and advised to receive when available. Verbalized acceptance and understanding.  Due for Pneumoccocal vaccine. Declined my offer to administer today. Education has been provided regarding the importance of this vaccine but still declined. Advised may receive this vaccine at local pharmacy or Health Dept. Aware to provide a copy of the vaccination record if obtained from local pharmacy or Health Dept. Verbalized acceptance and understanding.  Screening Tests Health Maintenance  Topic Date Due  . PNA vac Low Risk Adult (2 of 2 - PPSV23) 06/10/2019 (Originally 01/03/2016)  . INFLUENZA VACCINE  10/24/2019 (Originally 06/01/2018)  . TETANUS/TDAP  07/07/2025  . DEXA SCAN  Completed    Cancer Screenings: Lung: Low Dose CT Chest recommended if Age 74-80 years, 30 pack-year currently smoking OR have quit w/in  15years. Patient does not qualify. Breast Screening: No longer required   Up to date of  Bone Density/Dexa? No. Completed 06/11/13. Results reflect osteopenia and osteoporosis. Repeat every 2 years. Colorectal: No longer required  Additional Screenings: Hepatitis C Screening: Does not qualify    Plan:  I have personally reviewed and addressed the Medicare Annual Wellness questionnaire and have noted the following in the patient's chart:  A. Medical and social history B. Use of alcohol, tobacco or illicit drugs  C. Current medications and supplements D. Functional ability and status E.  Nutritional status F.  Physical activity G. Advance directives H. List of other physicians I.  Hospitalizations, surgeries, and ER visits in previous 12 months J.  [place] such as hearing and vision if needed, cognitive and depression L. Referrals and appointments  In addition, I have reviewed and discussed with patient certain preventive protocols, quality metrics, and best practice recommendations. A written personalized care plan for preventive services as well as general preventive health recommendations were provided to patient.  See attached scanned questionnaire for additional information.   Signed,  Aleatha Borer, LPN Nurse Health Advisor  I have reviewed this encounter including the documentation in this note and/or discussed this patient with the provider, Aleatha Borer, LPN. I am certifying that I agree with the content of this note as supervising physician.  Steele Sizer, MD [facility] 06/09/2018, 9:40 PM

## 2018-06-09 NOTE — Patient Instructions (Signed)
[name] , Thank you for taking time to come for your Medicare Wellness Visit. I appreciate your ongoing commitment to your health goals. Please review the following plan we discussed and let me know if I can assist you in the future.   Screening recommendations/referrals: Colorectal Screening: No longer required Mammogram: No longer required Bone Density: Declined  Vision and Dental Exams: Recommended annual ophthalmology exams for early detection of glaucoma and other disorders of the eye Recommended annual dental exams for proper oral hygiene  Vaccinations: Influenza vaccine: Overdue Pneumococcal vaccine: Declined Tdap vaccine: Up to date Shingles vaccine: Please call your insurance company to determine your out of pocket expense for the Shingrix vaccine. You may also receive this vaccine at your local pharmacy or Health Dept.    Advanced directives: Advance directive discussed with you today. I have provided a copy for you to complete at home and have notarized. Once this is complete please bring a copy in to our office so we can scan it into your chart.  Goals: Recommend to drink at least 6-8 8oz glasses of water per day.  Next appointment: Please schedule your Annual Wellness Visit with your Nurse Health Advisor in one year.  Preventive Care 59 Years and Older, Female Preventive care refers to lifestyle choices and visits with your health care provider that can promote health and wellness. What does preventive care include?  A yearly physical exam. This is also called an annual well check.  Dental exams once or twice a year.  Routine eye exams. Ask your health care provider how often you should have your eyes checked.  Personal lifestyle choices, including:  Daily care of your teeth and gums.  Regular physical activity.  Eating a healthy diet.  Avoiding tobacco and drug use.  Limiting alcohol use.  Practicing safe sex.  Taking low-dose aspirin every  day.  Taking vitamin and mineral supplements as recommended by your health care provider. What happens during an annual well check? The services and screenings done by your health care provider during your annual well check will depend on your age, overall health, lifestyle risk factors, and family history of disease. Counseling  Your health care provider may ask you questions about your:  Alcohol use.  Tobacco use.  Drug use.  Emotional well-being.  Home and relationship well-being.  Sexual activity.  Eating habits.  History of falls.  Memory and ability to understand (cognition).  Work and work Statistician.  Reproductive health. Screening  You may have the following tests or measurements:  Height, weight, and BMI.  Blood pressure.  Lipid and cholesterol levels. These may be checked every 5 years, or more frequently if you are over 54 years old.  Skin check.  Lung cancer screening. You may have this screening every year starting at age 108 if you have a 30-pack-year history of smoking and currently smoke or have quit within the past 15 years.  Fecal occult blood test (FOBT) of the stool. You may have this test every year starting at age 9.  Flexible sigmoidoscopy or colonoscopy. You may have a sigmoidoscopy every 5 years or a colonoscopy every 10 years starting at age 23.  Hepatitis C blood test.  Hepatitis B blood test.  Sexually transmitted disease (STD) testing.  Diabetes screening. This is done by checking your blood sugar (glucose) after you have not eaten for a while (fasting). You may have this done every 1-3 years.  Bone density scan. This is done to screen for osteoporosis.  You may have this done starting at age 22.  Mammogram. This may be done every 1-2 years. Talk to your health care provider about how often you should have regular mammograms. Talk with your health care provider about your test results, treatment options, and if necessary, the need  for more tests. Vaccines  Your health care provider may recommend certain vaccines, such as:  Influenza vaccine. This is recommended every year.  Tetanus, diphtheria, and acellular pertussis (Tdap, Td) vaccine. You may need a Td booster every 10 years.  Zoster vaccine. You may need this after age 16.  Pneumococcal 13-valent conjugate (PCV13) vaccine. One dose is recommended after age 69.  Pneumococcal polysaccharide (PPSV23) vaccine. One dose is recommended after age 71. Talk to your health care provider about which screenings and vaccines you need and how often you need them. This information is not intended to replace advice given to you by your health care provider. Make sure you discuss any questions you have with your health care provider. Document Released: 11/14/2015 Document Revised: 07/07/2016 Document Reviewed: 08/19/2015 Elsevier Interactive Patient Education  2017 [place] Prevention in the Home Falls can cause injuries. They can happen to people of all ages. There are many things you can do to make your home safe and to help prevent falls. What can I do on the outside of my home?  Regularly fix the edges of walkways and driveways and fix any cracks.  Remove anything that might make you trip as you walk through a door, such as a raised step or threshold.  Trim any bushes or trees on the path to your home.  Use bright outdoor lighting.  Clear any walking paths of anything that might make someone trip, such as rocks or tools.  Regularly check to see if handrails are loose or broken. Make sure that both sides of any steps have handrails.  Any raised decks and porches should have guardrails on the edges.  Have any leaves, snow, or ice cleared regularly.  Use sand or salt on walking paths during winter.  Clean up any spills in your garage right away. This includes oil or grease spills. What can I do in the bathroom?  Use night lights.  Install grab bars  by the toilet and in the tub and shower. Do not use towel bars as grab bars.  Use non-skid mats or decals in the tub or shower.  If you need to sit down in the shower, use a plastic, non-slip stool.  Keep the floor dry. Clean up any water that spills on the floor as soon as it happens.  Remove soap buildup in the tub or shower regularly.  Attach bath mats securely with double-sided non-slip rug tape.  Do not have throw rugs and other things on the floor that can make you trip. What can I do in the bedroom?  Use night lights.  Make sure that you have a light by your bed that is easy to reach.  Do not use any sheets or blankets that are too big for your bed. They should not hang down onto the floor.  Have a firm chair that has side arms. You can use this for support while you get dressed.  Do not have throw rugs and other things on the floor that can make you trip. What can I do in the kitchen?  Clean up any spills right away.  Avoid walking on wet floors.  Keep items that you use a  lot in easy-to-reach places.  If you need to reach something above you, use a strong step stool that has a grab bar.  Keep electrical cords out of the way.  Do not use floor polish or wax that makes floors slippery. If you must use wax, use non-skid floor wax.  Do not have throw rugs and other things on the floor that can make you trip. What can I do with my stairs?  Do not leave any items on the stairs.  Make sure that there are handrails on both sides of the stairs and use them. Fix handrails that are broken or loose. Make sure that handrails are as long as the stairways.  Check any carpeting to make sure that it is firmly attached to the stairs. Fix any carpet that is loose or worn.  Avoid having throw rugs at the top or bottom of the stairs. If you do have throw rugs, attach them to the floor with carpet tape.  Make sure that you have a light switch at the top of the stairs and the  bottom of the stairs. If you do not have them, ask someone to add them for you. What else can I do to help prevent falls?  Wear shoes that:  Do not have high heels.  Have rubber bottoms.  Are comfortable and fit you well.  Are closed at the toe. Do not wear sandals.  If you use a stepladder:  Make sure that it is fully opened. Do not climb a closed stepladder.  Make sure that both sides of the stepladder are locked into place.  Ask someone to hold it for you, if possible.  Clearly mark and make sure that you can see:  Any grab bars or handrails.  First and last steps.  Where the edge of each step is.  Use tools that help you move around (mobility aids) if they are needed. These include:  Canes.  Walkers.  Scooters.  Crutches.  Turn on the lights when you go into a dark area. Replace any light bulbs as soon as they burn out.  Set up your furniture so you have a clear path. Avoid moving your furniture around.  If any of your floors are uneven, fix them.  If there are any pets around you, be aware of where they are.  Review your medicines with your doctor. Some medicines can make you feel dizzy. This can increase your chance of falling. Ask your doctor what other things that you can do to help prevent falls. This information is not intended to replace advice given to you by your health care provider. Make sure you discuss any questions you have with your health care provider. Document Released: 08/14/2009 Document Revised: 03/25/2016 Document Reviewed: 11/22/2014 Elsevier Interactive Patient Education  2017 [employer].

## 2018-06-21 DIAGNOSIS — H40153 Residual stage of open-angle glaucoma, bilateral: Secondary | ICD-10-CM | POA: Diagnosis not present

## 2018-07-07 ENCOUNTER — Ambulatory Visit (INDEPENDENT_AMBULATORY_CARE_PROVIDER_SITE_OTHER): Payer: PPO | Admitting: Family Medicine

## 2018-07-07 ENCOUNTER — Encounter: Payer: Self-pay | Admitting: Family Medicine

## 2018-07-07 VITALS — BP 118/54 | HR 103 | Temp 98.3°F | Resp 14 | Ht 64.0 in | Wt 162.3 lb

## 2018-07-07 DIAGNOSIS — D692 Other nonthrombocytopenic purpura: Secondary | ICD-10-CM

## 2018-07-07 DIAGNOSIS — E785 Hyperlipidemia, unspecified: Secondary | ICD-10-CM

## 2018-07-07 DIAGNOSIS — F330 Major depressive disorder, recurrent, mild: Secondary | ICD-10-CM | POA: Diagnosis not present

## 2018-07-07 DIAGNOSIS — E559 Vitamin D deficiency, unspecified: Secondary | ICD-10-CM

## 2018-07-07 DIAGNOSIS — F5101 Primary insomnia: Secondary | ICD-10-CM

## 2018-07-07 DIAGNOSIS — I10 Essential (primary) hypertension: Secondary | ICD-10-CM

## 2018-07-07 DIAGNOSIS — R739 Hyperglycemia, unspecified: Secondary | ICD-10-CM

## 2018-07-07 DIAGNOSIS — M810 Age-related osteoporosis without current pathological fracture: Secondary | ICD-10-CM

## 2018-07-07 MED ORDER — SIMVASTATIN 40 MG PO TABS: 40.0000 mg | ORAL_TABLET | Freq: Every day | ORAL | 1 refills | Status: DC

## 2018-07-07 MED ORDER — FUROSEMIDE 20 MG PO TABS: 20.0000 mg | ORAL_TABLET | Freq: Every day | ORAL | 1 refills | Status: DC

## 2018-07-07 MED ORDER — SPIRONOLACTONE 100 MG PO TABS: 100.0000 mg | ORAL_TABLET | Freq: Every day | ORAL | 1 refills | Status: DC

## 2018-07-07 NOTE — Progress Notes (Signed)
Name: Leslie Patel   MRN: [id number]    DOB: June 03, 1941   Date:07/07/2018       Progress Note  Subjective  Chief Complaint  Chief Complaint  Patient presents with  . Follow-up    6 months    HPI  HTN: she takes Spironolactone, Lasix and potassium for many years. She has a history of severe hypokalemia, and used to take 10 potassium pills daily, but is doing well with current regiment. She is afraid of stopping medications. Discussed other options but she is not interested. Discussed causes of hypokalemia but he does not want to be checked for it. She states she was admitted initially and multiple tests were done 12 years ago.She does not want any further testing. It is time to recheck labs. BP is towards low end of normal today and discussed cutting lasix spironolactone in half, she denies dizziness, chest pain or palpitation   Hyperlipidemia: taking Simvastatin, discussed changing to Atorvastatin but she is afraid of changing therapy. No myalgias, no chest pain , palpitation. or decrease in exercise tolerance. Due for repeat labs  Osteoporosis: she is having a lot of teeth procedures and is not a candidate for bisphosphonate, discussed referral to Endocrinologist and consideration to Prolia or Forteo. Shestill refuses referral. Taking otc calcium and vitamin D  Metabolic Syndrome/hyperglycemia: she denies polyphagia, polydipsia or polyuria. Last hgbA1C was 5.7 and stable over the past few years.  We will recheck labs today   History of breast cancer: recently had a repeat biopsy, but it was negative, scar tissue. 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 Phq 9 is elevated, she refuses to take medications at this time   Patient Active Problem List   Diagnosis Date Noted  . Mild recurrent major depression ([place]) 07/07/2018  . History of  breast cancer 10/22/2015  . Senile purpura ([place]) 07/08/2015  . History of dental problems 07/08/2015  . Benign essential HTN 07/04/2015  . Dyslipidemia 07/04/2015  . Personal history of malignant neoplasm of breast 07/04/2015  . H/O: rheumatic fever 07/04/2015  . Insomnia 07/04/2015  . Dysmetabolic syndrome 93/71/6967  . OP (osteoporosis) 07/04/2015  . Allergic rhinitis, seasonal 07/04/2015  . Vitamin D deficiency 07/04/2015  . Tobacco use 07/04/2015    Past Surgical History:  Procedure Laterality Date  . ABDOMINAL HYSTERECTOMY    . BREAST BIOPSY Left 2006   invasive lobular carcinoma  . BREAST BIOPSY Right 10/22/2014   Fibrocystic changes, pseudo-angiomatous stromal hyperplasia.  Marland Kitchen BREAST EXCISIONAL BIOPSY Right 1991   benign  . BREAST LUMPECTOMY Left 09/2005   invasive lobular carcinoma clear margins  . BREAST SURGERY Left 2006   lumpectomy  . CATARACT EXTRACTION, BILATERAL     Left-07/2014 Right-06-25-14  . fracture left arm     Repair  . PORT-A-CATH REMOVAL  11/2007  . PORTACATH PLACEMENT  2006    Family History  Problem Relation Age of Onset  . Tuberculosis Mother   . Cancer Father        leukemia  . Cancer Daughter        Breast  . Diabetes Daughter   . Rheum arthritis Daughter   . Obesity Daughter   . Breast cancer Daughter 34  . Other Brother        spinal meningitis  . Breast cancer Other 17    Social History  Socioeconomic History  . Marital status: Widowed    Spouse name: Not on file  . Number of children: 2  . Years of education: some college  . Highest education level: 12th grade  Occupational History    Employer: RETIRED  Social Needs  . Financial resource strain: Not hard at all  . Food insecurity:    Worry: Never true    Inability: Never true  . Transportation needs:    Medical: No    Non-medical: No  Tobacco Use  . Smoking status: Light Tobacco Smoker    Packs/day: 0.30    Years: 30.00    Pack years: 9.00    Types: Cigarettes   . Smokeless tobacco: Never Used  . Tobacco comment: smokes only when she becones stressed  Substance and Sexual Activity  . Alcohol use: No    Alcohol/week: 0.0 standard drinks  . Drug use: No  . Sexual activity: Not Currently  Lifestyle  . Physical activity:    Days per week: 0 days    Minutes per session: 0 min  . Stress: Not at all  Relationships  . Social connections:    Talks on phone: Patient refused    Gets together: Patient refused    Attends religious service: Patient refused    Active member of club or organization: Patient refused    Attends meetings of clubs or organizations: Patient refused    Relationship status: Widowed  . Intimate partner violence:    Fear of current or ex partner: No    Emotionally abused: No    Physically abused: No    Forced sexual activity: No  Other Topics Concern  . Not on file  Social History Narrative  . Not on file     Current Outpatient Medications:  .  Calcium Carb-Cholecalciferol 600-100 MG-UNIT CAPS, Take 1 tablet by mouth daily., Disp: , Rfl:  .  furosemide (LASIX) 20 MG tablet, Take 1 tablet (20 mg total) by mouth daily., Disp: 90 tablet, Rfl: 1 .  latanoprost (XALATAN) 0.005 % ophthalmic solution, Place 1 drop into both eyes at bedtime., Disp: , Rfl:  .  potassium chloride SA (K-DUR,KLOR-CON) 20 MEQ tablet, Take 1 tablet (20 mEq total) by mouth 4 (four) times daily., Disp: 360 tablet, Rfl: 3 .  simvastatin (ZOCOR) 40 MG tablet, Take 1 tablet (40 mg total) by mouth daily. for cholesterol, Disp: 90 tablet, Rfl: 1 .  spironolactone (ALDACTONE) 100 MG tablet, Take 1 tablet (100 mg total) by mouth daily., Disp: 90 tablet, Rfl: 1  Allergies  Allergen Reactions  . Darvon [Propoxyphene] Anaphylaxis  . Loratadine Anaphylaxis  . Cephalosporins   . Codeine   . Eggs Or Egg-Derived Products   . Paroxetine   . Penicillins   . Sulfa Antibiotics   . Xanax  [Alprazolam]   . Percocet [Oxycodone-Acetaminophen] Itching, Rash and Other  (See Comments)    wheezing     ROS  Constitutional: Negative for fever or weight change.  Respiratory: Negative for cough and shortness of breath.   Cardiovascular: Negative for chest pain or palpitations.  Gastrointestinal: Negative for abdominal pain, no bowel changes.  Musculoskeletal: Negative for gait problem or joint swelling.  Skin: Negative for rash.  Neurological: Negative for dizziness or headache.  No other specific complaints in a complete review of systems (except as listed in HPI above).   Objective  Vitals:   07/07/18 1324  BP: (!) 116/50  Pulse: (!) 103  Resp: 14  Temp: 98.3 F (36.8  C)  TempSrc: Oral  SpO2: 98%  Weight: 162 lb 4.8 oz (73.6 kg)  Height: 5\' 4"  (1.626 m)    Body mass index is 27.86 kg/m.  Physical Exam  Constitutional: Patient appears well-developed and well-nourished. Overweight.  No distress.  HEENT: head atraumatic, normocephalic, pupils equal and reactive to light, neck supple, throat within normal limits Cardiovascular: Normal rate, regular rhythm and normal heart sounds.  No murmur heard. No BLE edema. Pulmonary/Chest: Effort normal and breath sounds normal. No respiratory distress. Abdominal: Soft.  There is no tenderness. Skin; still has some purpura on both arms Psychiatric: Patient seems anxious, mild pressure speech. Judgment and thought content normal.  PHQ2/9: Depression screen [facility] 2/9 07/07/2018 07/07/2018 06/09/2018 06/06/2018 07/08/2017  Decreased Interest 0 0 0 0 0  Down, Depressed, Hopeless 3 0 0 0 0  PHQ - 2 Score 3 0 0 0 0  Altered sleeping 3 - 0 0 -  Tired, decreased energy 1 - 0 0 -  Change in appetite 0 - 0 0 -  Feeling bad or failure about yourself  0 - 0 0 -  Trouble concentrating 0 - 0 0 -  Moving slowly or fidgety/restless 0 - 0 0 -  Suicidal thoughts 0 - 0 0 -  PHQ-9 Score 7 - 0 0 -  Difficult doing work/chores Somewhat difficult - Not difficult at all Not difficult at all -     Fall Risk: Fall Risk   07/07/2018 06/09/2018 06/06/2018 01/04/2018 07/08/2017  Falls in the past year? No No No No No  Risk for fall due to : - Impaired vision;History of fall(s) - - -  Risk for fall due to: Comment - wears eyeglasses - - -     Functional Status Survey: Is the patient deaf or have difficulty hearing?: No Does the patient have difficulty seeing, even when wearing glasses/contacts?: No Does the patient have difficulty concentrating, remembering, or making decisions?: No Does the patient have difficulty walking or climbing stairs?: No Does the patient have difficulty dressing or bathing?: No Does the patient have difficulty doing errands alone such as visiting a doctor's office or shopping?: No    Assessment & Plan  1. Dyslipidemia  - simvastatin (ZOCOR) 40 MG tablet; Take 1 tablet (40 mg total) by mouth daily. for cholesterol  Dispense: 90 tablet; Refill: 1 - Lipid panel  2. Benign essential HTN  - spironolactone (ALDACTONE) 100 MG tablet; Take 1 tablet (100 mg total) by mouth daily.  Dispense: 90 tablet; Refill: 1 - furosemide (LASIX) 20 MG tablet; Take 1 tablet (20 mg total) by mouth daily.  Dispense: 90 tablet; Refill: 1 - COMPLETE METABOLIC PANEL WITH GFR - CBC with Differential/Platelet  3. Senile purpura ([facility])  Stable  4. Hyperglycemia  - Hemoglobin A1c  5. Vitamin D deficiency  Taking vitamin D supplementation   6. Age-related osteoporosis without current pathological fracture  Only calcium and vitamin D , refuses other medications  7. Primary insomnia  She is not sleeping, discussed medication or otc melatonin but she is afraid to try any new medications  8. Mild recurrent major depression ([place])  She refuses medication, states related to recent car problems.

## 2018-07-08 LAB — HEMOGLOBIN A1C
EAG (MMOL/L): 6.5 (calc)
HEMOGLOBIN A1C: 5.7 %{Hb} — AB (ref <5.7)
MEAN PLASMA GLUCOSE: 117 (calc)

## 2018-07-08 LAB — LIPID PANEL
CHOL/HDL RATIO: 2.5 (calc) (ref <5.0)
Cholesterol: 133 mg/dL (ref <200)
HDL: 53 mg/dL (ref >50)
LDL CHOLESTEROL (CALC): 60 mg/dL
Non-HDL Cholesterol (Calc): 80 mg/dL (calc) (ref <130)
Triglycerides: 116 mg/dL (ref <150)

## 2018-07-08 LAB — CBC WITH DIFFERENTIAL/PLATELET
BASOS ABS: 71 {cells}/uL (ref 0–200)
Basophils Relative: 0.9 %
EOS ABS: 111 {cells}/uL (ref 15–500)
Eosinophils Relative: 1.4 %
HCT: 41.9 % (ref 35.0–45.0)
HEMOGLOBIN: 14.5 g/dL (ref 11.7–15.5)
Lymphs Abs: 2844 cells/uL (ref 850–3900)
MCH: 33.5 pg — AB (ref 27.0–33.0)
MCHC: 34.6 g/dL (ref 32.0–36.0)
MCV: 96.8 fL (ref 80.0–100.0)
MONOS PCT: 8.1 %
MPV: 12.1 fL (ref 7.5–12.5)
NEUTROS ABS: 4234 {cells}/uL (ref 1500–7800)
Neutrophils Relative %: 53.6 %
Platelets: 163 10*3/uL (ref 140–400)
RBC: 4.33 10*6/uL (ref 3.80–5.10)
RDW: 12 % (ref 11.0–15.0)
TOTAL LYMPHOCYTE: 36 %
WBC: 7.9 10*3/uL (ref 3.8–10.8)
WBCMIX: 640 {cells}/uL (ref 200–950)

## 2018-07-08 LAB — COMPLETE METABOLIC PANEL WITH GFR
AG Ratio: 2 (calc) (ref 1.0–2.5)
ALBUMIN MSPROF: 4.5 g/dL (ref 3.6–5.1)
ALT: 26 U/L (ref 6–29)
AST: 31 U/L (ref 10–35)
Alkaline phosphatase (APISO): 89 U/L (ref 33–130)
BILIRUBIN TOTAL: 0.6 mg/dL (ref 0.2–1.2)
BUN / CREAT RATIO: 11 (calc) (ref 6–22)
BUN: 11 mg/dL (ref 7–25)
CO2: 24 mmol/L (ref 20–32)
CREATININE: 1.03 mg/dL — AB (ref 0.60–0.93)
Calcium: 9.9 mg/dL (ref 8.6–10.4)
Chloride: 99 mmol/L (ref 98–110)
GFR, EST AFRICAN AMERICAN: 60 mL/min/{1.73_m2} (ref >=60)
GFR, Est Non African American: 52 mL/min/{1.73_m2} — ABNORMAL LOW (ref >=60)
GLOBULIN: 2.3 g/dL (ref 1.9–3.7)
GLUCOSE: 112 mg/dL — AB (ref 65–99)
Potassium: 4.7 mmol/L (ref 3.5–5.3)
SODIUM: 132 mmol/L — AB (ref 135–146)
TOTAL PROTEIN: 6.8 g/dL (ref 6.1–8.1)

## 2018-07-14 ENCOUNTER — Other Ambulatory Visit: Payer: Self-pay | Admitting: Family Medicine

## 2018-07-14 ENCOUNTER — Ambulatory Visit (INDEPENDENT_AMBULATORY_CARE_PROVIDER_SITE_OTHER): Payer: PPO

## 2018-07-14 VITALS — BP 100/50 | HR 90

## 2018-07-14 DIAGNOSIS — I10 Essential (primary) hypertension: Secondary | ICD-10-CM

## 2018-07-14 MED ORDER — FUROSEMIDE 20 MG PO TABS: 10.0000 mg | ORAL_TABLET | Freq: Every day | ORAL | 1 refills | Status: DC

## 2018-07-14 MED ORDER — SPIRONOLACTONE 100 MG PO TABS: 50.0000 mg | ORAL_TABLET | Freq: Every day | ORAL | 1 refills | Status: DC

## 2018-07-14 NOTE — Addendum Note (Signed)
Addended by: Steele Sizer F on: 07/14/2018 05:07 PM   Modules accepted: Orders

## 2018-07-14 NOTE — Progress Notes (Addendum)
Patient is here for a blood pressure check. Patient denies chest pain, palpitations, shortness of breath or visual disturbances. At previous visit blood pressure was 118/54 with a heart rate of 103. Today during nurse visit first check blood pressure was 110/50 with a heart rate of 95. After resting for 10 minutes it was 100/50 and heart rate was 90. She does take Spironolactone 100 mg and Lasix 20 mg daily as prescribed. She has not missed any doses of her medications. She also brought in her bp monitor from home and I checked it twice and her automated bp was 128/68 and 134/72.  She needs to take half of lasix and half of aldactone and recheck bp in one week. BP is too low  Phone message left by  Vonna Kotyk

## 2018-07-21 ENCOUNTER — Ambulatory Visit: Payer: PPO

## 2018-07-21 DIAGNOSIS — I10 Essential (primary) hypertension: Secondary | ICD-10-CM

## 2018-07-21 MED ORDER — SPIRONOLACTONE 25 MG PO TABS: 25.0000 mg | ORAL_TABLET | Freq: Every day | ORAL | 1 refills | Status: DC

## 2018-07-21 NOTE — Progress Notes (Signed)
Patient is here for a blood pressure check. Patient denies chest pain, palpitations, shortness of breath or visual disturbances. At previous visit blood pressure was 118/54 with a heart rate of 103. Today during nurse visit first check blood pressure was 116/56.  She does take any blood pressure medications.  Takes half of her Spironolactone 100 mg and Lasix 20 mg daily as prescribed. She has not missed any doses of her medications.   We will decrease spironlactone to 50 mg daily and continue lasix at 10 mg daily recheck bp in 1 week

## 2018-07-28 ENCOUNTER — Ambulatory Visit (INDEPENDENT_AMBULATORY_CARE_PROVIDER_SITE_OTHER): Payer: PPO

## 2018-07-28 DIAGNOSIS — I10 Essential (primary) hypertension: Secondary | ICD-10-CM

## 2018-07-28 NOTE — Patient Instructions (Signed)
Patient is here for a blood pressure check. Patient denies chest pain, palpitations, shortness of breath or visual disturbances. At previous visit blood pressure was 118/54 with a heart rate of 103. Today during nurse visit first check blood pressure was 110/63 with a resting heart rate of 102. She takes all medication as directed.  Patient will return in 1 month for labs and BP check.

## 2018-08-17 ENCOUNTER — Other Ambulatory Visit: Payer: Self-pay | Admitting: Family Medicine

## 2018-09-05 ENCOUNTER — Encounter: Payer: Self-pay | Admitting: Family Medicine

## 2018-09-05 ENCOUNTER — Ambulatory Visit (INDEPENDENT_AMBULATORY_CARE_PROVIDER_SITE_OTHER): Payer: PPO | Admitting: Family Medicine

## 2018-09-05 VITALS — BP 102/48 | HR 100 | Temp 98.1°F | Resp 18 | Ht 64.0 in | Wt 172.2 lb

## 2018-09-05 DIAGNOSIS — I952 Hypotension due to drugs: Secondary | ICD-10-CM

## 2018-09-05 DIAGNOSIS — F439 Reaction to severe stress, unspecified: Secondary | ICD-10-CM | POA: Diagnosis not present

## 2018-09-05 DIAGNOSIS — E876 Hypokalemia: Secondary | ICD-10-CM | POA: Diagnosis not present

## 2018-09-05 MED ORDER — POTASSIUM CHLORIDE CRYS ER 20 MEQ PO TBCR: 20.0000 meq | EXTENDED_RELEASE_TABLET | Freq: Four times a day (QID) | ORAL | 3 refills | Status: DC

## 2018-09-05 NOTE — Progress Notes (Signed)
Name: Leslie Patel   MRN: [id number]    DOB: 1941-09-06   Date:09/05/2018       Progress Note  Subjective  Chief Complaint  Chief Complaint  Patient presents with  . Follow-up    1 month F/U  . Hypertension    Has been cutting Spironolactone in half 12.5 mg daily and Lasix in half 5 mg daily. BP is still low.    HPI  HTN/currently with hypotension : she returned today to check bp since we decreased dose of lasix to 10 mg and aldactone 12.5 mg since last visit because bp has been low. Today bp is still low, she is not having edema, except when she stands for a long period of time. She feels tired, but denies SOB or orthopnea.   Hypokalemia: diagnosed many years ago, however was on diuretics explained that we need to recheck levels once stops diuretics   Stress: still frustrated about problems with car dealership, waiting for hearing .   Patient Active Problem List   Diagnosis Date Noted  . Mild recurrent major depression ([place]) 07/07/2018  . History of breast cancer 10/22/2015  . Senile purpura ([place]) 07/08/2015  . History of dental problems 07/08/2015  . Benign essential HTN 07/04/2015  . Dyslipidemia 07/04/2015  . Personal history of malignant neoplasm of breast 07/04/2015  . H/O: rheumatic fever 07/04/2015  . Insomnia 07/04/2015  . Dysmetabolic syndrome 31/54/0086  . OP (osteoporosis) 07/04/2015  . Allergic rhinitis, seasonal 07/04/2015  . Vitamin D deficiency 07/04/2015  . Tobacco use 07/04/2015    Past Surgical History:  Procedure Laterality Date  . ABDOMINAL HYSTERECTOMY    . BREAST BIOPSY Left 2006   invasive lobular carcinoma  . BREAST BIOPSY Right 10/22/2014   Fibrocystic changes, pseudo-angiomatous stromal hyperplasia.  Marland Kitchen BREAST EXCISIONAL BIOPSY Right 1991   benign  . BREAST LUMPECTOMY Left 09/2005   invasive lobular carcinoma clear margins  . BREAST SURGERY Left 2006   lumpectomy  . CATARACT EXTRACTION, BILATERAL     Left-07/2014 Right-06-25-14  .  fracture left arm     Repair  . PORT-A-CATH REMOVAL  11/2007  . PORTACATH PLACEMENT  2006    Family History  Problem Relation Age of Onset  . Tuberculosis Mother   . Cancer Father        leukemia  . Cancer Daughter        Breast  . Diabetes Daughter   . Rheum arthritis Daughter   . Obesity Daughter   . Breast cancer Daughter 74  . Other Brother        spinal meningitis  . Breast cancer Other 77    Social History   Socioeconomic History  . Marital status: Widowed    Spouse name: Not on file  . Number of children: 2  . Years of education: some college  . Highest education level: 12th grade  Occupational History    Employer: RETIRED  Social Needs  . Financial resource strain: Not hard at all  . Food insecurity:    Worry: Never true    Inability: Never true  . Transportation needs:    Medical: No    Non-medical: No  Tobacco Use  . Smoking status: Light Tobacco Smoker    Packs/day: 0.30    Years: 30.00    Pack years: 9.00    Types: Cigarettes  . Smokeless tobacco: Never Used  . Tobacco comment: smokes only when she becones stressed  Substance and Sexual Activity  . Alcohol  use: No    Alcohol/week: 0.0 standard drinks  . Drug use: No  . Sexual activity: Not Currently  Lifestyle  . Physical activity:    Days per week: 0 days    Minutes per session: 0 min  . Stress: Not at all  Relationships  . Social connections:    Talks on phone: Patient refused    Gets together: Patient refused    Attends religious service: Patient refused    Active member of club or organization: Patient refused    Attends meetings of clubs or organizations: Patient refused    Relationship status: Widowed  . Intimate partner violence:    Fear of current or ex partner: No    Emotionally abused: No    Physically abused: No    Forced sexual activity: No  Other Topics Concern  . Not on file  Social History Narrative  . Not on file     Current Outpatient Medications:  .  Calcium  Carb-Cholecalciferol 600-100 MG-UNIT CAPS, Take 1 tablet by mouth daily., Disp: , Rfl:  .  latanoprost (XALATAN) 0.005 % ophthalmic solution, Place 1 drop into both eyes at bedtime., Disp: , Rfl:  .  potassium chloride SA (K-DUR,KLOR-CON) 20 MEQ tablet, Take 1 tablet (20 mEq total) by mouth 4 (four) times daily., Disp: 360 tablet, Rfl: 3 .  simvastatin (ZOCOR) 40 MG tablet, Take 1 tablet (40 mg total) by mouth daily. for cholesterol, Disp: 90 tablet, Rfl: 1  Allergies  Allergen Reactions  . Darvon [Propoxyphene] Anaphylaxis  . Loratadine Anaphylaxis  . Cephalosporins   . Codeine   . Eggs Or Egg-Derived Products   . Paroxetine   . Penicillins   . Sulfa Antibiotics   . Xanax  [Alprazolam]   . Percocet [Oxycodone-Acetaminophen] Itching, Rash and Other (See Comments)    wheezing    I personally reviewed active problem list, medication list, allergies, family history, social history with the patient/caregiver today.   ROS  Ten systems reviewed and is negative except as mentioned in HPI   Objective  Vitals:   09/05/18 1158  BP: (!) 102/48  Pulse: 100  Resp: 18  Temp: 98.1 F (36.7 C)  TempSrc: Oral  SpO2: 98%  Weight: 172 lb 3.2 oz (78.1 kg)  Height: 5\' 4"  (1.626 m)    Body mass index is 29.56 kg/m.  Physical Exam  Constitutional: Patient appears well-developed and well-nourished. Overweight.  No distress.  HEENT: head atraumatic, normocephalic, pupils equal and reactive to light,  neck supple, throat within normal limits Cardiovascular: Normal rate, regular rhythm and normal heart sounds.  No murmur heard. No BLE edema. Pulmonary/Chest: Effort normal and breath sounds normal. No respiratory distress. Abdominal: Soft.  There is no tenderness. Psychiatric: Patient has a normal mood and affect. behavior is normal. Judgment and thought content normal.  Recent Results (from the past 2160 hour(s))  COMPLETE METABOLIC PANEL WITH GFR     Status: Abnormal   Collection Time:  07/07/18  2:18 PM  Result Value Ref Range   Glucose, Bld 112 (H) 65 - 99 mg/dL    Comment: .            Fasting reference interval . For someone without known diabetes, a glucose value between 100 and 125 mg/dL is consistent with prediabetes and should be confirmed with a follow-up test. .    BUN 11 7 - 25 mg/dL   Creat 1.03 (H) 0.60 - 0.93 mg/dL    Comment: For patients >49 years  of age, the reference limit for Creatinine is approximately 13% higher for people identified as African-American. .    GFR, Est Non African American 52 (L) > OR = 60 mL/min/1.53m2   GFR, Est African American 60 > OR = 60 mL/min/1.14m2   BUN/Creatinine Ratio 11 6 - 22 (calc)   Sodium 132 (L) 135 - 146 mmol/L   Potassium 4.7 3.5 - 5.3 mmol/L   Chloride 99 98 - 110 mmol/L   CO2 24 20 - 32 mmol/L   Calcium 9.9 8.6 - 10.4 mg/dL   Total Protein 6.8 6.1 - 8.1 g/dL   Albumin 4.5 3.6 - 5.1 g/dL   Globulin 2.3 1.9 - 3.7 g/dL (calc)   AG Ratio 2.0 1.0 - 2.5 (calc)   Total Bilirubin 0.6 0.2 - 1.2 mg/dL   Alkaline phosphatase (APISO) 89 33 - 130 U/L   AST 31 10 - 35 U/L   ALT 26 6 - 29 U/L  CBC with Differential/Platelet     Status: Abnormal   Collection Time: 07/07/18  2:18 PM  Result Value Ref Range   WBC 7.9 3.8 - 10.8 Thousand/uL   RBC 4.33 3.80 - 5.10 Million/uL   Hemoglobin 14.5 11.7 - 15.5 g/dL   HCT 41.9 35.0 - 45.0 %   MCV 96.8 80.0 - 100.0 fL   MCH 33.5 (H) 27.0 - 33.0 pg   MCHC 34.6 32.0 - 36.0 g/dL   RDW 12.0 11.0 - 15.0 %   Platelets 163 140 - 400 Thousand/uL   MPV 12.1 7.5 - 12.5 fL   Neutro Abs 4,234 1,500 - 7,800 cells/uL   Lymphs Abs 2,844 850 - 3,900 cells/uL   WBC mixed population 640 200 - 950 cells/uL   Eosinophils Absolute 111 15 - 500 cells/uL   Basophils Absolute 71 0 - 200 cells/uL   Neutrophils Relative % 53.6 %   Total Lymphocyte 36.0 %   Monocytes Relative 8.1 %   Eosinophils Relative 1.4 %   Basophils Relative 0.9 %  Lipid panel     Status: None   Collection Time:  07/07/18  2:18 PM  Result Value Ref Range   Cholesterol 133 <200 mg/dL   HDL 53 >50 mg/dL   Triglycerides 116 <150 mg/dL   LDL Cholesterol (Calc) 60 mg/dL (calc)    Comment: Reference range: <100 . Desirable range <100 mg/dL for primary prevention;   <70 mg/dL for patients with CHD or diabetic patients  with > or = 2 CHD risk factors. Marland Kitchen LDL-C is now calculated using the Martin-Hopkins  calculation, which is a validated novel method providing  better accuracy than the Friedewald equation in the  estimation of LDL-C.  Cresenciano Genre et al. Annamaria Helling. 4098;119(14): 2061-2068  (http://education.[facility].com/faq/FAQ164)    Total CHOL/HDL Ratio 2.5 <5.0 (calc)   Non-HDL Cholesterol (Calc) 80 <130 mg/dL (calc)    Comment: For patients with diabetes plus 1 major ASCVD risk  factor, treating to a non-HDL-C goal of <100 mg/dL  (LDL-C of <70 mg/dL) is considered a therapeutic  option.   Hemoglobin A1c     Status: Abnormal   Collection Time: 07/07/18  2:18 PM  Result Value Ref Range   Hgb A1c MFr Bld 5.7 (H) <5.7 % of total Hgb    Comment: For someone without known diabetes, a hemoglobin  A1c value between 5.7% and 6.4% is consistent with prediabetes and should be confirmed with a  follow-up test. . For someone with known diabetes, a value <7% indicates that their  diabetes is well controlled. A1c targets should be individualized based on duration of diabetes, age, comorbid conditions, and other considerations. . This assay result is consistent with an increased risk of diabetes. . Currently, no consensus exists regarding use of hemoglobin A1c for diagnosis of diabetes for children. .    Mean Plasma Glucose 117 (calc)   eAG (mmol/L) 6.5 (calc)     PHQ2/9: Depression screen [facility] 2/9 09/05/2018 07/07/2018 07/07/2018 06/09/2018 06/06/2018  Decreased Interest 0 0 0 0 0  Down, Depressed, Hopeless 1 3 0 0 0  PHQ - 2 Score 1 3 0 0 0  Altered sleeping 1 3 - 0 0  Tired, decreased energy 1 1  - 0 0  Change in appetite 0 0 - 0 0  Feeling bad or failure about yourself  0 0 - 0 0  Trouble concentrating 0 0 - 0 0  Moving slowly or fidgety/restless 0 0 - 0 0  Suicidal thoughts 0 0 - 0 0  PHQ-9 Score 3 7 - 0 0  Difficult doing work/chores Not difficult at all Somewhat difficult - Not difficult at all Not difficult at all     Fall Risk: Fall Risk  09/05/2018 07/07/2018 06/09/2018 06/06/2018 01/04/2018  Falls in the past year? 1 No No No No  Number falls in past yr: 1 - - - -  Injury with Fall? 0 - - - -  Comment Tripped on a can of hairspray in the bathroom-landed on her buttock - - - -  Risk for fall due to : - - Impaired vision;History of fall(s) - -  Risk for fall due to: Comment - - wears eyeglasses - -      Assessment & Plan  1. Hypotension due to medication   bp is low, we will stop lasix and aldactone , she states potassium has always been low, but explained since we will stop diuretics she needs to return in 1 week to recheck labs.  - potassium chloride SA (K-DUR,KLOR-CON) 20 MEQ tablet; Take 1 tablet (20 mEq total) by mouth 4 (four) times daily.  Dispense: 360 tablet; Refill: 3 - BASIC METABOLIC PANEL WITH GFR  2. Hypokalemia  - potassium chloride SA (K-DUR,KLOR-CON) 20 MEQ tablet; Take 1 tablet (20 mEq total) by mouth 4 (four) times daily.  Dispense: 360 tablet; Refill: 3 - BASIC METABOLIC PANEL WITH GFR   3. Stress  Improving

## 2018-09-13 DIAGNOSIS — I10 Essential (primary) hypertension: Secondary | ICD-10-CM | POA: Diagnosis not present

## 2018-09-13 DIAGNOSIS — E876 Hypokalemia: Secondary | ICD-10-CM | POA: Diagnosis not present

## 2018-09-13 LAB — BASIC METABOLIC PANEL WITH GFR
BUN: 8 mg/dL (ref 7–25)
CO2: 25 mmol/L (ref 20–32)
CREATININE: 0.92 mg/dL (ref 0.60–0.93)
Calcium: 9.5 mg/dL (ref 8.6–10.4)
Chloride: 104 mmol/L (ref 98–110)
GFR, EST NON AFRICAN AMERICAN: 60 mL/min/{1.73_m2} (ref >=60)
GFR, Est African American: 69 mL/min/{1.73_m2} (ref >=60)
Glucose, Bld: 108 mg/dL — ABNORMAL HIGH (ref 65–99)
Potassium: 4.6 mmol/L (ref 3.5–5.3)
SODIUM: 137 mmol/L (ref 135–146)

## 2018-09-14 ENCOUNTER — Telehealth: Payer: Self-pay | Admitting: Family Medicine

## 2018-09-14 ENCOUNTER — Other Ambulatory Visit: Payer: Self-pay | Admitting: Family Medicine

## 2018-09-14 NOTE — Telephone Encounter (Signed)
Copied from [place] [id number]. Topic: Quick Communication - See Telephone Encounter >> Sep 14, 2018  3:45 PM Blase Mess A wrote: CRM for notification. See Telephone encounter for: 09/14/18.  Minette Headland is calling from [facility] regarding spironolactone (ALDACTONE) 25 MG tablet [[id number]]  DISCONTINUED received script on 07/21/18 However medication is now on the discontinued list please advise. [phone]

## 2018-09-15 NOTE — Telephone Encounter (Signed)
Spoke with Minette Headland at [facility] and informed them Dr. Ancil Boozer has D/C all Spironolactone dosages.

## 2018-09-15 NOTE — Telephone Encounter (Signed)
Please let them know active list is up to date

## 2018-09-21 DIAGNOSIS — Z599 Problem related to housing and economic circumstances, unspecified: Secondary | ICD-10-CM

## 2018-09-21 DIAGNOSIS — Z598 Other problems related to housing and economic circumstances: Secondary | ICD-10-CM

## 2018-09-21 NOTE — Progress Notes (Signed)
Pt came to office for lab work and notified lab personnel that she had been trying to get in contact with Aleatha Borer, previous NHA at [facility] regarding assistance with dental bills. Pt states she has left voice messages (not sure if she was calling a direct line or calling [facility]) pt left a hand written note stating:  "I was told from Ammie to go to a dentist so I can get my teeth fix because of a previous dentist messed up the bones in my mouth. I have tried several times to get in contact with Ammie to bring the paper work from the dentist, the ladies at the front desk of [facility] tells me that they don't take them now let me know how to get in touch with Amy. Please call me at [phone]. Thank you."  Patient brought copies of outstanding bills from Dr. Brett Albino located at [place], [place] [address]. Ph [phone].   Pt has an outstanding balance of $370.00 for services provided by this dental office. Please provide community resources if available for assistance with payment. Thank you.

## 2018-09-22 ENCOUNTER — Telehealth: Payer: Self-pay | Admitting: Family Medicine

## 2018-09-22 NOTE — Telephone Encounter (Signed)
I called patient about her needing assistance with paying a dental bill. Patient states that the previous NHA at [facility] told her she could go to the dentist and bring the bill back to [facility] and they could pay the bill. Unfortunately we do not have a resource for that and it sounds like she misunderstood what the NHA advised her to do. It sounds like she was advised to get a quote for the dental work she needed and the [facility] team would attempt to find a dentist and or resource to help her get her dental care. The patient saw the dentist and had $370 worth of work completed. I apologized for this confusion and explained that as of right now we did not know of a resource to pay this bill for her. Patient states she has paid the bill but could still use the assistance. I told her that I would be glad to do some research and see if I could find anything to help her but that I could not make any promises. Patient states that she understands and thanks me for trying.

## 2018-10-05 ENCOUNTER — Ambulatory Visit (INDEPENDENT_AMBULATORY_CARE_PROVIDER_SITE_OTHER): Payer: PPO | Admitting: Family Medicine

## 2018-10-05 ENCOUNTER — Encounter: Payer: Self-pay | Admitting: Family Medicine

## 2018-10-05 VITALS — BP 102/56 | HR 100 | Temp 98.2°F | Resp 14 | Ht 64.0 in | Wt 173.2 lb

## 2018-10-05 DIAGNOSIS — I959 Hypotension, unspecified: Secondary | ICD-10-CM

## 2018-10-05 NOTE — Progress Notes (Signed)
Name: Leslie Patel   MRN: [id number]    DOB: [birth year]   Date:10/05/2018       Progress Note  Subjective  Chief Complaint  Chief Complaint  Patient presents with  . Follow-up    blood pressure running low. at home 100/36    HPI    Patient presents to follow up on hypotension. She saw PCP Dr. Ancil Boozer in November and had Ranging 100-125/30's-60.  Average is about 100's/50's. She denies dizziness, seldomly has orthostatic lightheadedness, no vision changes, headaches, chest pain, fevers/chills.  She does feel weak on occasion, and this is the only symptom she has been able to ID.  She is a current smoker - when she gets really nervous she will smoke just for a period of time, then quit.    - She was told to stop the lasix and spironolactone, but her feet swelled, so she restarted both medications at 1/2 tablets. Her swelling has since greatly improved.  - She notes she has been under immense stress lately - she bought a car and it was not a good car - her insurance won't insure the car, the dealer won't take it back.  She did get another car.    Patient Active Problem List   Diagnosis Date Noted  . Mild recurrent major depression ([place]) 07/07/2018  . History of breast cancer 10/22/2015  . Senile purpura ([place]) 07/08/2015  . History of dental problems 07/08/2015  . Benign essential HTN 07/04/2015  . Dyslipidemia 07/04/2015  . Personal history of malignant neoplasm of breast 07/04/2015  . H/O: rheumatic fever 07/04/2015  . Insomnia 07/04/2015  . Dysmetabolic syndrome 70/62/3762  . OP (osteoporosis) 07/04/2015  . Allergic rhinitis, seasonal 07/04/2015  . Vitamin D deficiency 07/04/2015  . Tobacco use 07/04/2015    Past Surgical History:  Procedure Laterality Date  . ABDOMINAL HYSTERECTOMY    . BREAST BIOPSY Left 2006   invasive lobular carcinoma  . BREAST BIOPSY Right 10/22/2014   Fibrocystic changes, pseudo-angiomatous stromal hyperplasia.  Marland Kitchen BREAST EXCISIONAL BIOPSY  Right 1991   benign  . BREAST LUMPECTOMY Left 09/2005   invasive lobular carcinoma clear margins  . BREAST SURGERY Left 2006   lumpectomy  . CATARACT EXTRACTION, BILATERAL     Left-07/2014 Right-06-25-14  . fracture left arm     Repair  . PORT-A-CATH REMOVAL  11/2007  . PORTACATH PLACEMENT  2006    Family History  Problem Relation Age of Onset  . Tuberculosis Mother   . Cancer Father        leukemia  . Cancer Daughter        Breast  . Diabetes Daughter   . Rheum arthritis Daughter   . Obesity Daughter   . Breast cancer Daughter 19  . Other Brother        spinal meningitis  . Breast cancer Other 28    Social History   Socioeconomic History  . Marital status: Widowed    Spouse name: Not on file  . Number of children: 2  . Years of education: some college  . Highest education level: 12th grade  Occupational History    Employer: RETIRED  Social Needs  . Financial resource strain: Not hard at all  . Food insecurity:    Worry: Never true    Inability: Never true  . Transportation needs:    Medical: No    Non-medical: No  Tobacco Use  . Smoking status: Light Tobacco Smoker    Packs/day: 0.30  Years: 30.00    Pack years: 9.00    Types: Cigarettes  . Smokeless tobacco: Never Used  . Tobacco comment: smokes only when she becones stressed  Substance and Sexual Activity  . Alcohol use: No    Alcohol/week: 0.0 standard drinks  . Drug use: No  . Sexual activity: Not Currently  Lifestyle  . Physical activity:    Days per week: 0 days    Minutes per session: 0 min  . Stress: Not at all  Relationships  . Social connections:    Talks on phone: Patient refused    Gets together: Patient refused    Attends religious service: Patient refused    Active member of club or organization: Patient refused    Attends meetings of clubs or organizations: Patient refused    Relationship status: Widowed  . Intimate partner violence:    Fear of current or ex partner: No     Emotionally abused: No    Physically abused: No    Forced sexual activity: No  Other Topics Concern  . Not on file  Social History Narrative  . Not on file     Current Outpatient Medications:  .  Calcium Carb-Cholecalciferol 600-100 MG-UNIT CAPS, Take 1 tablet by mouth daily., Disp: , Rfl:  .  latanoprost (XALATAN) 0.005 % ophthalmic solution, Place 1 drop into both eyes at bedtime., Disp: , Rfl:  .  potassium chloride SA (K-DUR,KLOR-CON) 20 MEQ tablet, Take 1 tablet (20 mEq total) by mouth 4 (four) times daily., Disp: 360 tablet, Rfl: 3 .  simvastatin (ZOCOR) 40 MG tablet, Take 1 tablet (40 mg total) by mouth daily. for cholesterol, Disp: 90 tablet, Rfl: 1  Allergies  Allergen Reactions  . Darvon [Propoxyphene] Anaphylaxis  . Loratadine Anaphylaxis  . Cephalosporins   . Codeine   . Eggs Or Egg-Derived Products   . Paroxetine   . Penicillins   . Sulfa Antibiotics   . Xanax  [Alprazolam]   . Percocet [Oxycodone-Acetaminophen] Itching, Rash and Other (See Comments)    wheezing    I personally reviewed active problem list, medication list, allergies, notes from last encounter, lab results with the patient/caregiver today.   ROS  Constitutional: Negative for fever or weight change.  Respiratory: Negative for cough and shortness of breath.   Cardiovascular: Negative for chest pain or palpitations.  Gastrointestinal: Negative for abdominal pain, no bowel changes.  Musculoskeletal: Negative for gait problem or joint swelling.  Skin: Negative for rash.  Neurological: Negative for dizziness or headache.  No other specific complaints in a complete review of systems (except as listed in HPI above).  Objective  Vitals:   10/05/18 1347  BP: (!) 104/58  Pulse: 100  Resp: 14  Temp: 98.2 F (36.8 C)  TempSrc: Oral  SpO2: 97%  Weight: 173 lb 3.2 oz (78.6 kg)  Height: 5\' 4"  (1.626 m)   Body mass index is 29.73 kg/m.  Physical Exam  Constitutional: Patient appears  well-developed and well-nourished. No distress.  HENT: Head: Normocephalic and atraumatic. Ears: bilateral TMs with no erythema or effusion; Nose: Nose normal. Eyes: Conjunctivae and EOM are normal. No scleral icterus. Neck: Normal range of motion. Neck supple. No JVD present. No thyromegaly present.  Cardiovascular: Normal rate, regular rhythm and normal heart sounds.  No murmur heard. No BLE edema. Pulmonary/Chest: Effort normal and breath sounds normal. No respiratory distress. Musculoskeletal: Normal range of motion, no joint effusions. No gross deformities Neurological: Pt is alert and oriented to person, place,  and time. No cranial nerve deficit. Coordination, balance, strength, speech and gait are normal.  Skin: Skin is warm and dry. No rash noted. No erythema.  Psychiatric: Patient has a normal mood and affect. behavior is normal. Judgment and thought content normal.  No results found for this or any previous visit (from the past 72 hour(s)).  PHQ2/9: Depression screen [facility] 2/9 10/05/2018 09/05/2018 07/07/2018 07/07/2018 06/09/2018  Decreased Interest 1 0 0 0 0  Down, Depressed, Hopeless 1 1 3  0 0  PHQ - 2 Score 2 1 3  0 0  Altered sleeping 0 1 3 - 0  Tired, decreased energy 0 1 1 - 0  Change in appetite 0 0 0 - 0  Feeling bad or failure about yourself  0 0 0 - 0  Trouble concentrating 0 0 0 - 0  Moving slowly or fidgety/restless 0 0 0 - 0  Suicidal thoughts 0 0 0 - 0  PHQ-9 Score 2 3 7  - 0  Difficult doing work/chores Not difficult at all Not difficult at all Somewhat difficult - Not difficult at all   Fall Risk: Fall Risk  10/05/2018 09/05/2018 07/07/2018 06/09/2018 06/06/2018  Falls in the past year? 1 1 No No No  Number falls in past yr: 1 1 - - -  Injury with Fall? 0 0 - - -  Comment - Tripped on a can of hairspray in the bathroom-landed on her buttock - - -  Risk for fall due to : - - - Impaired vision;History of fall(s) -  Risk for fall due to: Comment - - - wears eyeglasses -    Assessment & Plan  1. Hypotension, unspecified hypotension type - advised to STOP spironolactone and may take Lasix PRN for swelling at 10mg . We will have her follow up for nurse visit in 2 weeks for BP check. - COMPLETE METABOLIC PANEL WITH GFR - CBC w/Diff/Platelet - TSH

## 2018-10-05 NOTE — Patient Instructions (Signed)
STOP taking spironolactone  May take 1/2 tablet of your furosemide once daily as needed for swelling.

## 2018-10-06 ENCOUNTER — Other Ambulatory Visit: Payer: Self-pay | Admitting: Family Medicine

## 2018-10-06 DIAGNOSIS — D696 Thrombocytopenia, unspecified: Secondary | ICD-10-CM

## 2018-10-06 LAB — COMPLETE METABOLIC PANEL WITH GFR
AG Ratio: 2.1 (calc) (ref 1.0–2.5)
ALBUMIN MSPROF: 4.2 g/dL (ref 3.6–5.1)
ALT: 27 U/L (ref 6–29)
AST: 35 U/L (ref 10–35)
Alkaline phosphatase (APISO): 87 U/L (ref 33–130)
BUN: 7 mg/dL (ref 7–25)
CALCIUM: 9.9 mg/dL (ref 8.6–10.4)
CO2: 28 mmol/L (ref 20–32)
CREATININE: 0.83 mg/dL (ref 0.60–0.93)
Chloride: 107 mmol/L (ref 98–110)
GFR, EST NON AFRICAN AMERICAN: 67 mL/min/{1.73_m2} (ref >=60)
GFR, Est African American: 78 mL/min/{1.73_m2} (ref >=60)
GLOBULIN: 2 g/dL (ref 1.9–3.7)
GLUCOSE: 96 mg/dL (ref 65–99)
Potassium: 4.6 mmol/L (ref 3.5–5.3)
SODIUM: 140 mmol/L (ref 135–146)
Total Bilirubin: 0.4 mg/dL (ref 0.2–1.2)
Total Protein: 6.2 g/dL (ref 6.1–8.1)

## 2018-10-06 LAB — CBC WITH DIFFERENTIAL/PLATELET
BASOS PCT: 0.9 %
Basophils Absolute: 58 cells/uL (ref 0–200)
EOS ABS: 109 {cells}/uL (ref 15–500)
Eosinophils Relative: 1.7 %
HEMATOCRIT: 40.3 % (ref 35.0–45.0)
Hemoglobin: 13.7 g/dL (ref 11.7–15.5)
Lymphs Abs: 2714 cells/uL (ref 850–3900)
MCH: 33.3 pg — ABNORMAL HIGH (ref 27.0–33.0)
MCHC: 34 g/dL (ref 32.0–36.0)
MCV: 97.8 fL (ref 80.0–100.0)
MPV: 12.7 fL — AB (ref 7.5–12.5)
Monocytes Relative: 8.6 %
NEUTROS PCT: 46.4 %
Neutro Abs: 2970 cells/uL (ref 1500–7800)
PLATELETS: 114 10*3/uL — AB (ref 140–400)
RBC: 4.12 10*6/uL (ref 3.80–5.10)
RDW: 11.9 % (ref 11.0–15.0)
TOTAL LYMPHOCYTE: 42.4 %
WBC: 6.4 10*3/uL (ref 3.8–10.8)
WBCMIX: 550 {cells}/uL (ref 200–950)

## 2018-10-06 LAB — TSH: TSH: 2.11 m[IU]/L (ref 0.40–4.50)

## 2018-10-10 DIAGNOSIS — H40153 Residual stage of open-angle glaucoma, bilateral: Secondary | ICD-10-CM | POA: Diagnosis not present

## 2018-10-19 ENCOUNTER — Ambulatory Visit (INDEPENDENT_AMBULATORY_CARE_PROVIDER_SITE_OTHER): Payer: PPO

## 2018-10-19 VITALS — BP 106/68 | HR 94

## 2018-10-19 DIAGNOSIS — I959 Hypotension, unspecified: Secondary | ICD-10-CM

## 2018-10-19 NOTE — Progress Notes (Addendum)
Patient is here for a blood pressure check. Patient denies chest pain, palpitations, shortness of breath or visual disturbances. At previous visit blood pressure was 102/56 with a heart rate of 100. Today during nurse visit first check blood pressure was 106/68 and heart rate of 94. She is now only on prn lasix , we will monitor

## 2018-11-24 ENCOUNTER — Ambulatory Visit (INDEPENDENT_AMBULATORY_CARE_PROVIDER_SITE_OTHER): Payer: PPO | Admitting: Nurse Practitioner

## 2018-11-24 ENCOUNTER — Encounter: Payer: Self-pay | Admitting: Nurse Practitioner

## 2018-11-24 VITALS — BP 102/48 | HR 97 | Temp 97.8°F | Resp 14 | Ht 64.0 in | Wt 168.0 lb

## 2018-11-24 DIAGNOSIS — J0141 Acute recurrent pansinusitis: Secondary | ICD-10-CM | POA: Diagnosis not present

## 2018-11-24 DIAGNOSIS — R059 Cough, unspecified: Secondary | ICD-10-CM

## 2018-11-24 DIAGNOSIS — R0981 Nasal congestion: Secondary | ICD-10-CM | POA: Diagnosis not present

## 2018-11-24 DIAGNOSIS — R05 Cough: Secondary | ICD-10-CM

## 2018-11-24 MED ORDER — FLUTICASONE PROPIONATE 50 MCG/ACT NA SUSP: 2.0000 | Freq: Every day | NASAL | 6 refills | Status: DC

## 2018-11-24 MED ORDER — AZITHROMYCIN 250 MG PO TABS: ORAL_TABLET | ORAL | 0 refills | Status: DC

## 2018-11-24 MED ORDER — BENZONATATE 100 MG PO CAPS: 100.0000 mg | ORAL_CAPSULE | Freq: Three times a day (TID) | ORAL | 0 refills | Status: DC | PRN

## 2018-11-24 NOTE — Progress Notes (Signed)
Name: Leslie Patel   MRN: [id number]    DOB: [birth year]   Date:11/24/2018       Progress Note  Subjective  Chief Complaint  Chief Complaint  Patient presents with  . Sinus Problem    patient presents with sinus issues since last sunday  . Facial Pain  . Cough    dry - patient stated that she has coughed so much that it hurts    HPI  Patient endorses almost 2 weeks of sinus pain, congestion, and strong dry cough.   States every since she started chemo noted frequent sinus infections. Has been off of chemo for over 2 years. States gets about one sinus infection a year.   Patient Active Problem List   Diagnosis Date Noted  . Mild recurrent major depression ([place]) 07/07/2018  . History of breast cancer 10/22/2015  . Senile purpura ([place]) 07/08/2015  . History of dental problems 07/08/2015  . Benign essential HTN 07/04/2015  . Dyslipidemia 07/04/2015  . Personal history of malignant neoplasm of breast 07/04/2015  . H/O: rheumatic fever 07/04/2015  . Insomnia 07/04/2015  . Dysmetabolic syndrome 76/22/6333  . OP (osteoporosis) 07/04/2015  . Allergic rhinitis, seasonal 07/04/2015  . Vitamin D deficiency 07/04/2015  . Tobacco use 07/04/2015    Past Medical History:  Diagnosis Date  . Allergy   . Bilateral cataracts   . Breast cancer ([place]) 09/16/2005   Left breast, 2.1 cm histologic grade 2, pT2,N1 (mic),M0. ER 90%, PR 70%, HER-2/neu not amplified.  . Cancer [facility]) 2006   left breast with radiation and chemo  . Dyslipidemia   . Edema   . Head injury 1995  . History of breast cancer   . History of chemotherapy 2006  . History of radiation therapy 2006  . Hx of rheumatic fever   . Hypertension   . Hypokalemia   . Insomnia   . Low blood potassium   . Metabolic syndrome   . Osteoporosis   . Personal history of chemotherapy   . Personal history of radiation therapy   . Shingles 2006  . Stress at home   . Symptomatic menopausal or female climacteric states   . Vitamin  D deficiency     Past Surgical History:  Procedure Laterality Date  . ABDOMINAL HYSTERECTOMY    . BREAST BIOPSY Left 2006   invasive lobular carcinoma  . BREAST BIOPSY Right 10/22/2014   Fibrocystic changes, pseudo-angiomatous stromal hyperplasia.  Marland Kitchen BREAST EXCISIONAL BIOPSY Right 1991   benign  . BREAST LUMPECTOMY Left 09/2005   invasive lobular carcinoma clear margins  . BREAST SURGERY Left 2006   lumpectomy  . CATARACT EXTRACTION, BILATERAL     Left-07/2014 Right-06-25-14  . fracture left arm     Repair  . PORT-A-CATH REMOVAL  11/2007  . PORTACATH PLACEMENT  2006    Social History   Tobacco Use  . Smoking status: Former Smoker    Packs/day: 0.30    Years: 30.00    Pack years: 9.00    Types: Cigarettes  . Smokeless tobacco: Never Used  . Tobacco comment: patient said that she has not touched a cigarette in a couple of months  Substance Use Topics  . Alcohol use: No    Alcohol/week: 0.0 standard drinks     Current Outpatient Medications:  .  Calcium Carb-Cholecalciferol 600-100 MG-UNIT CAPS, Take 1 tablet by mouth daily., Disp: , Rfl:  .  furosemide (LASIX) 20 MG tablet, Take 10 mg by mouth daily as  needed., Disp: , Rfl:  .  latanoprost (XALATAN) 0.005 % ophthalmic solution, Place 1 drop into both eyes at bedtime., Disp: , Rfl:  .  potassium chloride SA (K-DUR,KLOR-CON) 20 MEQ tablet, Take 1 tablet (20 mEq total) by mouth 4 (four) times daily., Disp: 360 tablet, Rfl: 3 .  simvastatin (ZOCOR) 40 MG tablet, Take 1 tablet (40 mg total) by mouth daily. for cholesterol, Disp: 90 tablet, Rfl: 1  Allergies  Allergen Reactions  . Darvon [Propoxyphene] Anaphylaxis  . Loratadine Anaphylaxis  . Cephalosporins   . Codeine   . Eggs Or Egg-Derived Products   . Paroxetine   . Penicillins   . Sulfa Antibiotics   . Xanax  [Alprazolam]   . Percocet [Oxycodone-Acetaminophen] Itching, Rash and Other (See Comments)    wheezing    ROS   No other specific complaints in a  complete review of systems (except as listed in HPI above).  Objective  Vitals:   11/24/18 1018  BP: (!) 102/48  Pulse: 97  Resp: 14  Temp: 97.8 F (36.6 C)  TempSrc: Oral  SpO2: 94%  Weight: 168 lb (76.2 kg)  Height: 5' 4"  (1.626 m)    Body mass index is 28.84 kg/m.  Nursing Note and Vital Signs reviewed.  Physical Exam Constitutional:      Appearance: Normal appearance. She is well-developed.  HENT:     Head: Normocephalic and atraumatic.     Right Ear: Hearing, tympanic membrane, ear canal and external ear normal.     Left Ear: Hearing, tympanic membrane, ear canal and external ear normal.     Nose: Nasal tenderness and congestion present.     Right Sinus: Maxillary sinus tenderness and frontal sinus tenderness present.     Left Sinus: Maxillary sinus tenderness and frontal sinus tenderness present.     Mouth/Throat:     Lips: Pink.     Mouth: Mucous membranes are moist.     Pharynx: No pharyngeal swelling, oropharyngeal exudate or posterior oropharyngeal erythema.  Eyes:     Conjunctiva/sclera: Conjunctivae normal.  Cardiovascular:     Rate and Rhythm: Normal rate and regular rhythm.     Heart sounds: Normal heart sounds.  Pulmonary:     Effort: Pulmonary effort is normal.     Breath sounds: Normal breath sounds.  Musculoskeletal: Normal range of motion.  Neurological:     Mental Status: She is alert and oriented to person, place, and time.  Psychiatric:        Speech: Speech normal.        Behavior: Behavior normal. Behavior is cooperative.        Thought Content: Thought content normal.        Judgment: Judgment normal.       No results found for this or any previous visit (from the past 48 hour(s)).  Assessment & Plan 1. Cough - benzonatate (TESSALON PERLES) 100 MG capsule; Take 1 capsule (100 mg total) by mouth 3 (three) times daily as needed for cough.  Dispense: 30 capsule; Refill: 0  2. Nasal congestion - fluticasone (FLONASE) 50 MCG/ACT  nasal spray; Place 2 sprays into both nostrils daily.  Dispense: 16 g; Refill: 6  3. Acute recurrent pansinusitis Discussed inappropriate [place] of antibiotics with patient and requested delayed pick up after trying OTC management first. Discussed using different abx patient vehemently states oncologist recommended zpack.  - azithromycin (ZITHROMAX) 250 MG tablet; 2 tab today and 1 tablet for the next 3 days  Dispense: 6  tablet; Refill: 0

## 2018-11-24 NOTE — Patient Instructions (Addendum)
Please take your antibiotic as prescribed with food on your stomach to prevent nausea and upset stomach. Take the antibiotic for the entire course, even if your symptoms resolve before the course if completed. Using antibiotics inappropriately can make it harder to treat future infections. If you have any concerning side effects please stop the medication and let us know immediately. I do recommend taking a probiotic to replenish your good gut health anytime you take an antibiotic. You can get probiotics in types of yogurt like activia, or other food/drinks such as kimchi, kombucha , sauerkraut, or you can take an over the counter supplement.   Sinusitis, Adult Sinusitis is soreness and swelling (inflammation) of your sinuses. Sinuses are hollow spaces in the bones around your face. They are located:  Around your eyes.  In the middle of your forehead.  Behind your nose.  In your cheekbones. Your sinuses and nasal passages are lined with a fluid called mucus. Mucus drains out of your sinuses. Swelling can trap mucus in your sinuses. This lets germs (bacteria, virus, or fungus) grow, which leads to infection. Most of the time, this condition is caused by a virus. What are the causes? This condition is caused by:  Allergies.  Asthma.  Germs.  Things that block your nose or sinuses.  Growths in the nose (nasal polyps).  Chemicals or irritants in the air.  Fungus (rare). What increases the risk? You are more likely to develop this condition if:  You have a weak body defense system (immune system).  You do a lot of swimming or diving.  You use nasal sprays too much.  You smoke. What are the signs or symptoms? The main symptoms of this condition are pain and a feeling of pressure around the sinuses. Other symptoms include:  Stuffy nose (congestion).  Runny nose (drainage).  Swelling and warmth in the sinuses.  Headache.  Toothache.  A cough that may get worse at  night.  Mucus that collects in the throat or the back of the nose (postnasal drip).  Being unable to smell and taste.  Being very tired (fatigue).  A fever.  Sore throat.  Bad breath. How is this diagnosed? This condition is diagnosed based on:  Your symptoms.  Your medical history.  A physical exam.  Tests to find out if your condition is short-term (acute) or long-term (chronic). Your doctor may: ? Check your nose for growths (polyps). ? Check your sinuses using a tool that has a light (endoscope). ? Check for allergies or germs. ? Do imaging tests, such as an MRI or CT scan. How is this treated? Treatment for this condition depends on the cause and whether it is short-term or long-term.  If caused by a virus, your symptoms should go away on their own within 10 days. You may be given medicines to relieve symptoms. They include: ? Medicines that shrink swollen tissue in the nose. ? Medicines that treat allergies (antihistamines). ? A spray that treats swelling of the nostrils. ? Rinses that help get rid of thick mucus in your nose (nasal saline washes).  If caused by bacteria, your doctor may wait to see if you will get better without treatment. You may be given antibiotic medicine if you have: ? A very bad infection. ? A weak body defense system.  If caused by growths in the nose, you may need to have surgery. Follow these instructions at home: Medicines  Take, use, or apply over-the-counter and prescription medicines only as told  by your doctor. These may include nasal sprays.  If you were prescribed an antibiotic medicine, take it as told by your doctor. Do not stop taking the antibiotic even if you start to feel better. Hydrate and humidify   Drink enough water to keep your pee (urine) pale yellow.  Use a cool mist humidifier to keep the humidity level in your home above 50%.  Breathe in steam for 10-15 minutes, 3-4 times a day, or as told by your doctor.  You can do this in the bathroom while a hot shower is running.  Try not to spend time in cool or dry air. Rest  Rest as much as you can.  Sleep with your head raised (elevated).  Make sure you get enough sleep each night. General instructions   Put a warm, moist washcloth on your face 3-4 times a day, or as often as told by your doctor. This will help with discomfort.  Wash your hands often with soap and water. If there is no soap and water, use hand sanitizer.  Do not smoke. Avoid being around people who are smoking (secondhand smoke).  Keep all follow-up visits as told by your doctor. This is important. Contact a doctor if:  You have a fever.  Your symptoms get worse.  Your symptoms do not get better within 10 days. Get help right away if:  You have a very bad headache.  You cannot stop throwing up (vomiting).  You have very bad pain or swelling around your face or eyes.  You have trouble seeing.  You feel confused.  Your neck is stiff.  You have trouble breathing. Summary  Sinusitis is swelling of your sinuses. Sinuses are hollow spaces in the bones around your face.  This condition is caused by tissues in your nose that become inflamed or swollen. This traps germs. These can lead to infection.  If you were prescribed an antibiotic medicine, take it as told by your doctor. Do not stop taking it even if you start to feel better.  Keep all follow-up visits as told by your doctor. This is important. This information is not intended to replace advice given to you by your health care provider. Make sure you discuss any questions you have with your health care provider. Document Released: 04/05/2008 Document Revised: 03/20/2018 Document Reviewed: 03/20/2018 Elsevier Interactive Patient Education  2019 [employer].

## 2018-12-11 DIAGNOSIS — M542 Cervicalgia: Secondary | ICD-10-CM | POA: Diagnosis not present

## 2019-01-08 ENCOUNTER — Encounter: Payer: Self-pay | Admitting: Family Medicine

## 2019-01-08 ENCOUNTER — Ambulatory Visit (INDEPENDENT_AMBULATORY_CARE_PROVIDER_SITE_OTHER): Payer: PPO | Admitting: Family Medicine

## 2019-01-08 VITALS — BP 110/54 | HR 115 | Temp 98.5°F | Resp 16 | Ht 64.0 in | Wt 167.8 lb

## 2019-01-08 DIAGNOSIS — I951 Orthostatic hypotension: Secondary | ICD-10-CM

## 2019-01-08 DIAGNOSIS — F330 Major depressive disorder, recurrent, mild: Secondary | ICD-10-CM | POA: Diagnosis not present

## 2019-01-08 DIAGNOSIS — E785 Hyperlipidemia, unspecified: Secondary | ICD-10-CM | POA: Diagnosis not present

## 2019-01-08 DIAGNOSIS — E559 Vitamin D deficiency, unspecified: Secondary | ICD-10-CM

## 2019-01-08 DIAGNOSIS — D692 Other nonthrombocytopenic purpura: Secondary | ICD-10-CM | POA: Diagnosis not present

## 2019-01-08 DIAGNOSIS — R600 Localized edema: Secondary | ICD-10-CM

## 2019-01-08 DIAGNOSIS — D696 Thrombocytopenia, unspecified: Secondary | ICD-10-CM | POA: Diagnosis not present

## 2019-01-08 DIAGNOSIS — E8881 Metabolic syndrome: Secondary | ICD-10-CM

## 2019-01-08 DIAGNOSIS — E876 Hypokalemia: Secondary | ICD-10-CM | POA: Diagnosis not present

## 2019-01-08 MED ORDER — SIMVASTATIN 40 MG PO TABS: 40.0000 mg | ORAL_TABLET | Freq: Every day | ORAL | 1 refills | Status: DC

## 2019-01-08 MED ORDER — FUROSEMIDE 20 MG PO TABS: 10.0000 mg | ORAL_TABLET | Freq: Every day | ORAL | 0 refills | Status: DC | PRN
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
## 2019-01-09 LAB — CBC WITH DIFFERENTIAL/PLATELET
ABSOLUTE MONOCYTES: 573 {cells}/uL (ref 200–950)
BASOS ABS: 83 {cells}/uL (ref 0–200)
Basophils Relative: 1 %
EOS PCT: 2.9 %
Eosinophils Absolute: 241 cells/uL (ref 15–500)
HEMATOCRIT: 41.1 % (ref 35.0–45.0)
Hemoglobin: 14.3 g/dL (ref 11.7–15.5)
Lymphs Abs: 3909 cells/uL — ABNORMAL HIGH (ref 850–3900)
MCH: 33.3 pg — ABNORMAL HIGH (ref 27.0–33.0)
MCHC: 34.8 g/dL (ref 32.0–36.0)
MCV: 95.6 fL (ref 80.0–100.0)
MPV: 12.3 fL (ref 7.5–12.5)
Monocytes Relative: 6.9 %
NEUTROS PCT: 42.1 %
Neutro Abs: 3494 cells/uL (ref 1500–7800)
PLATELETS: 151 10*3/uL (ref 140–400)
RBC: 4.3 10*6/uL (ref 3.80–5.10)
RDW: 12.8 % (ref 11.0–15.0)
TOTAL LYMPHOCYTE: 47.1 %
WBC: 8.3 10*3/uL (ref 3.8–10.8)

## 2019-01-09 LAB — BASIC METABOLIC PANEL WITH GFR
BUN / CREAT RATIO: 11 (calc) (ref 6–22)
BUN: 11 mg/dL (ref 7–25)
CHLORIDE: 103 mmol/L (ref 98–110)
CO2: 26 mmol/L (ref 20–32)
Calcium: 9.8 mg/dL (ref 8.6–10.4)
Creat: 0.97 mg/dL — ABNORMAL HIGH (ref 0.60–0.93)
GFR, EST AFRICAN AMERICAN: 64 mL/min/{1.73_m2} (ref >=60)
GFR, EST NON AFRICAN AMERICAN: 56 mL/min/{1.73_m2} — AB (ref >=60)
GLUCOSE: 93 mg/dL (ref 65–99)
POTASSIUM: 4.2 mmol/L (ref 3.5–5.3)
Sodium: 140 mmol/L (ref 135–146)

## 2019-04-26 DIAGNOSIS — H40153 Residual stage of open-angle glaucoma, bilateral: Secondary | ICD-10-CM | POA: Diagnosis not present

## 2019-06-01 ENCOUNTER — Other Ambulatory Visit: Payer: Self-pay

## 2019-06-01 DIAGNOSIS — E876 Hypokalemia: Secondary | ICD-10-CM

## 2019-06-01 DIAGNOSIS — I952 Hypotension due to drugs: Secondary | ICD-10-CM

## 2019-06-01 MED ORDER — POTASSIUM CHLORIDE CRYS ER 20 MEQ PO TBCR: 20.0000 meq | EXTENDED_RELEASE_TABLET | Freq: Four times a day (QID) | ORAL | 0 refills | Status: DC

## 2019-06-05 ENCOUNTER — Other Ambulatory Visit: Payer: Self-pay | Admitting: Family Medicine

## 2019-06-05 DIAGNOSIS — R600 Localized edema: Secondary | ICD-10-CM

## 2019-06-05 NOTE — Telephone Encounter (Signed)
Refill request for general medication.Lasix  Last office visit 01/08/2019   Follow up on 07/11/2019

## 2019-07-02 ENCOUNTER — Telehealth: Payer: Self-pay | Admitting: Family Medicine

## 2019-07-02 NOTE — Telephone Encounter (Signed)
I contacted the patient today to get her scheduled for her Medicare Annual Wellness Visit and the patient has questions concerning a possible Covid-19 exposure. She would like to know if she needs to be tested. Patient states she feels fine and does not have any symptoms. I advised patient that I could not advise her but that I could send a message to her provider and that someone from the office would contact her. Patient agreed to wait to hear from a nurse.

## 2019-07-04 NOTE — Telephone Encounter (Signed)
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 Ask about her exposure time and if she was having any symptoms. Patient denied any symptoms and states she did not have direct exposure with her. Just went to pick up bills where she was after she left. Dr. Ancil Boozer states she just needs to monitor for any symptoms such as SOB, fever, fatigue, runny nose and loss of taste. Patient verbalized understanding.

## 2019-07-11 ENCOUNTER — Other Ambulatory Visit: Payer: Self-pay

## 2019-07-11 ENCOUNTER — Encounter: Payer: Self-pay | Admitting: Family Medicine

## 2019-07-11 ENCOUNTER — Ambulatory Visit (INDEPENDENT_AMBULATORY_CARE_PROVIDER_SITE_OTHER): Payer: PPO | Admitting: Family Medicine

## 2019-07-11 DIAGNOSIS — E785 Hyperlipidemia, unspecified: Secondary | ICD-10-CM

## 2019-07-11 DIAGNOSIS — I10 Essential (primary) hypertension: Secondary | ICD-10-CM | POA: Diagnosis not present

## 2019-07-11 DIAGNOSIS — E559 Vitamin D deficiency, unspecified: Secondary | ICD-10-CM | POA: Diagnosis not present

## 2019-07-11 DIAGNOSIS — R739 Hyperglycemia, unspecified: Secondary | ICD-10-CM

## 2019-07-11 DIAGNOSIS — M810 Age-related osteoporosis without current pathological fracture: Secondary | ICD-10-CM | POA: Diagnosis not present

## 2019-07-11 DIAGNOSIS — F325 Major depressive disorder, single episode, in full remission: Secondary | ICD-10-CM | POA: Diagnosis not present

## 2019-07-11 DIAGNOSIS — D692 Other nonthrombocytopenic purpura: Secondary | ICD-10-CM

## 2019-07-11 DIAGNOSIS — E8881 Metabolic syndrome: Secondary | ICD-10-CM | POA: Diagnosis not present

## 2019-07-11 DIAGNOSIS — F5101 Primary insomnia: Secondary | ICD-10-CM

## 2019-07-11 DIAGNOSIS — E876 Hypokalemia: Secondary | ICD-10-CM | POA: Diagnosis not present

## 2019-07-11 MED ORDER — POTASSIUM CHLORIDE CRYS ER 20 MEQ PO TBCR: 20.0000 meq | EXTENDED_RELEASE_TABLET | Freq: Four times a day (QID) | ORAL | 0 refills | Status: DC

## 2019-07-11 MED ORDER — SIMVASTATIN 40 MG PO TABS: 40.0000 mg | ORAL_TABLET | Freq: Every day | ORAL | 1 refills | Status: DC
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
## 2019-07-17 DIAGNOSIS — I10 Essential (primary) hypertension: Secondary | ICD-10-CM | POA: Diagnosis not present

## 2019-07-17 DIAGNOSIS — R739 Hyperglycemia, unspecified: Secondary | ICD-10-CM | POA: Diagnosis not present

## 2019-07-17 DIAGNOSIS — E8881 Metabolic syndrome: Secondary | ICD-10-CM | POA: Diagnosis not present

## 2019-07-17 DIAGNOSIS — E785 Hyperlipidemia, unspecified: Secondary | ICD-10-CM | POA: Diagnosis not present

## 2019-07-18 LAB — COMPLETE METABOLIC PANEL WITH GFR
AG Ratio: 1.9 (calc) (ref 1.0–2.5)
ALT: 34 U/L — ABNORMAL HIGH (ref 6–29)
AST: 37 U/L — ABNORMAL HIGH (ref 10–35)
Albumin: 4.2 g/dL (ref 3.6–5.1)
Alkaline phosphatase (APISO): 104 U/L (ref 37–153)
BUN: 8 mg/dL (ref 7–25)
CO2: 25 mmol/L (ref 20–32)
Calcium: 9.6 mg/dL (ref 8.6–10.4)
Chloride: 105 mmol/L (ref 98–110)
Creat: 0.9 mg/dL (ref 0.60–0.93)
GFR, Est African American: 70 mL/min/{1.73_m2} (ref >=60)
GFR, Est Non African American: 61 mL/min/{1.73_m2} (ref >=60)
Globulin: 2.2 g/dL (calc) (ref 1.9–3.7)
Glucose, Bld: 118 mg/dL — ABNORMAL HIGH (ref 65–99)
Potassium: 4.1 mmol/L (ref 3.5–5.3)
Sodium: 140 mmol/L (ref 135–146)
Total Bilirubin: 0.6 mg/dL (ref 0.2–1.2)
Total Protein: 6.4 g/dL (ref 6.1–8.1)

## 2019-07-18 LAB — HEMOGLOBIN A1C
Hgb A1c MFr Bld: 5.8 % of total Hgb — ABNORMAL HIGH (ref <5.7)
Mean Plasma Glucose: 120 (calc)
eAG (mmol/L): 6.6 (calc)

## 2019-07-18 LAB — LIPID PANEL
Cholesterol: 145 mg/dL (ref <200)
HDL: 55 mg/dL (ref >=50)
LDL Cholesterol (Calc): 68 mg/dL (calc)
Non-HDL Cholesterol (Calc): 90 mg/dL (calc) (ref <130)
Total CHOL/HDL Ratio: 2.6 (calc) (ref <5.0)
Triglycerides: 138 mg/dL (ref <150)

## 2019-07-18 LAB — CBC WITH DIFFERENTIAL/PLATELET
Absolute Monocytes: 640 cells/uL (ref 200–950)
Basophils Absolute: 71 cells/uL (ref 0–200)
Basophils Relative: 0.9 %
Eosinophils Absolute: 237 cells/uL (ref 15–500)
Eosinophils Relative: 3 %
HCT: 42.1 % (ref 35.0–45.0)
Hemoglobin: 14 g/dL (ref 11.7–15.5)
Lymphs Abs: 3587 cells/uL (ref 850–3900)
MCH: 31.7 pg (ref 27.0–33.0)
MCHC: 33.3 g/dL (ref 32.0–36.0)
MCV: 95.2 fL (ref 80.0–100.0)
MPV: 12.8 fL — ABNORMAL HIGH (ref 7.5–12.5)
Monocytes Relative: 8.1 %
Neutro Abs: 3365 cells/uL (ref 1500–7800)
Neutrophils Relative %: 42.6 %
Platelets: 166 10*3/uL (ref 140–400)
RBC: 4.42 10*6/uL (ref 3.80–5.10)
RDW: 12.7 % (ref 11.0–15.0)
Total Lymphocyte: 45.4 %
WBC: 7.9 10*3/uL (ref 3.8–10.8)

## 2019-07-18 LAB — VITAMIN D 25 HYDROXY (VIT D DEFICIENCY, FRACTURES): Vit D, 25-Hydroxy: 30 ng/mL (ref 30–100)

## 2019-08-03 ENCOUNTER — Other Ambulatory Visit: Payer: Self-pay

## 2019-08-03 ENCOUNTER — Encounter: Payer: Self-pay | Admitting: Family Medicine

## 2019-08-03 ENCOUNTER — Ambulatory Visit (INDEPENDENT_AMBULATORY_CARE_PROVIDER_SITE_OTHER): Payer: PPO | Admitting: Family Medicine

## 2019-08-03 VITALS — BP 122/60 | HR 121 | Temp 96.6°F | Resp 14 | Ht 64.0 in | Wt 180.7 lb

## 2019-08-03 DIAGNOSIS — L989 Disorder of the skin and subcutaneous tissue, unspecified: Secondary | ICD-10-CM | POA: Diagnosis not present

## 2019-08-03 NOTE — Patient Instructions (Signed)
Return for the procedure to get a biopsy.  If needed we can refer you to dermatology as well.  If it itches again work on hydrating your skin and you can put over the counter hydrocortisone cream on it 1-2 x a day for 5-7 days.  Mole A mole is a colored (pigmented) growth on the skin. Moles are very common. They are usually harmless, but some moles can become cancerous over time. What are the causes? Moles are caused when pigmented skin cells grow together in clusters instead of spreading out in the skin as they normally do. The reason why the skin cells grow together in clusters is not known. What increases the risk? You are more likely to develop a mole if you:  Have family members who have moles.  Are white.  Have blond hair.  Are often outdoors and exposed to the sun.  Received phototherapy when you were a newborn baby.  Are female. What are the signs or symptoms? A mole may be:  [place] or black.  Flat or raised.  Smooth or wrinkled. How is this diagnosed? A mole is diagnosed with a skin exam. If your health care provider thinks a mole may be cancerous, all or part of the mole will be removed for testing (biopsy). How is this treated? Most moles are noncancerous (benign) and do not require treatment. If a mole is found to be cancerous, it will be removed. You may also choose to have a mole removed if it is causing pain or if you do not like the way it looks. Follow these instructions at home:  General instructions   Every month, look for new moles and check your existing moles for changes. This is important because a change in a mole can mean that the mole has become cancerous.  ABCDE changes in a mole indicate that you should be evaluated by your health care provider. ABCDE stands for: ? Asymmetry. This means the mole has an irregular shape. It is not round or oval. ? Border. This means the mole has an irregular or bumpy border. ? Color. This means the mole has  multiple colors in it, including brown, black, blue, red, or tan. Note that it is normal for moles to get darker when a woman is pregnant or takes birth control pills. ? Diameter. This means the mole is more than 0.2 inches (6 mm) across. ? Evolving. This refers to any unusual changes or symptoms in the mole, such as pain, itching, stinging, sensitivity, or bleeding.  If you have a large number of moles, see a skin doctor (dermatologist) at least one time every year for a full-body skin check. Lifestyle  When you are outdoors, wear sunscreen with SPF 30 (sun protection factor 30) or higher.  Use an adequate amount of sunscreen to cover exposed areas of skin. Put it on 30 minutes before you go out. Reapply it every 2 hours or anytime you come out of the water.  When you are out in the sun, wear a broad-brimmed hat and clothing that covers your arms and legs. Wear wraparound sunglasses. Contact a health care provider if:  The size, shape, borders, or color of your mole changes.  Your mole, or the skin near the mole, becomes painful, sore, red, or swollen.  Your mole: ? Develops more than one color. ? Itches or bleeds. ? Becomes scaly, sheds skin, or oozes fluid. ? Becomes flat or develops raised areas. ? Becomes hard or soft.  You develop  a new mole. Summary  A mole is a colored (pigmented) growth on the skin. Moles are very common. They are usually harmless, but some moles can become cancerous over time.  Every month, look for new moles and check your existing moles for changes. This is important because a change in a mole can mean that the mole has become cancerous.  If you have a large number of moles, see a skin doctor (dermatologist) at least one time every year for a full-body skin check.  When you are outdoors, wear sunscreen with SPF 30 (sun protection factor 30) or higher. Reapply it every 2 hours or anytime you come out of the water.  Contact a health care provider if you  notice changes in a mole or if you develop a new mole. This information is not intended to replace advice given to you by your health care provider. Make sure you discuss any questions you have with your health care provider. Document Released: 07/13/2001 Document Revised: 03/14/2018 Document Reviewed: 03/14/2018 Elsevier Patient Education  2020 [employer].

## 2019-08-03 NOTE — Progress Notes (Signed)
Patient ID: Leslie Patel, female    DOB: [birth year], 78 y.o.   MRN: [id number]  PCP: Steele Sizer, MD  Chief Complaint  Patient presents with  . mole    left leg     Subjective:   Leslie Patel is a 78 y.o. female, presents to clinic with CC of the following:  Patient is concerned with a mole on her left lower leg.  She notes that is been there for a long time but it used to be shiny and black and very flat but it started itching over the past couple weeks, and she scratched it.  It now appears abnormal to her, smaller than it used to be.  She is concerned about having it checked because of a mole or skin lesion about 10 years ago that a dermatologist evaluated her for did a biopsy but never sent it to pathology.  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 She reports that it is changed color, it feels itchy, burning and feels hot to the touch.  Patient Active Problem List   Diagnosis Date Noted  . Mild recurrent major depression ([place]) 07/07/2018  . History of breast cancer 10/22/2015  . Senile purpura ([place]) 07/08/2015  . History of dental problems 07/08/2015  . Benign essential HTN 07/04/2015  . Dyslipidemia 07/04/2015  . Personal history of malignant neoplasm of breast 07/04/2015  . H/O: rheumatic fever 07/04/2015  . Insomnia 07/04/2015  . Dysmetabolic syndrome [id number]  . OP (osteoporosis) 07/04/2015  . Allergic rhinitis, seasonal 07/04/2015  . Vitamin D deficiency 07/04/2015  . Tobacco use 07/04/2015      Current Outpatient Medications:  .  latanoprost (XALATAN) 0.005 % ophthalmic solution, Place 1 drop into both eyes at bedtime., Disp: , Rfl:  .  potassium chloride SA (K-DUR) 20 MEQ tablet, Take 1 tablet (20 mEq total) by mouth 4 (four) times daily., Disp: 360 tablet, Rfl: 0 .  simvastatin (ZOCOR) 40 MG tablet, Take 1 tablet (40 mg total) by mouth daily. for cholesterol, Disp: 90 tablet, Rfl: 1 .   Calcium Carb-Cholecalciferol 600-100 MG-UNIT CAPS, Take 1 tablet by mouth daily., Disp: , Rfl:  .  fluticasone (FLONASE) 50 MCG/ACT nasal spray, Place 2 sprays into both nostrils daily. (Patient not taking: Reported on 08/03/2019), Disp: 16 g, Rfl: 6   Allergies  Allergen Reactions  . Darvon [Propoxyphene] Anaphylaxis  . Loratadine Anaphylaxis  . Cephalosporins   . Codeine   . Eggs Or Egg-Derived Products   . Paroxetine   . Penicillins   . Sulfa Antibiotics   . Xanax  [Alprazolam]   . Percocet [Oxycodone-Acetaminophen] Itching, Rash and Other (See Comments)    wheezing     Family History  Problem Relation Age of Onset  . Tuberculosis Mother   . Cancer Father        leukemia  . Cancer Daughter        Breast  . Diabetes Daughter   . Rheum arthritis Daughter   . Obesity Daughter   . Breast cancer Daughter 57  . Throat cancer Daughter   . Other Brother        spinal meningitis  . Kidney failure Granddaughter      Social History   Socioeconomic History  . Marital status: Widowed    Spouse name: Not on file  . Number of children: 2  . Years of education: some college  . Highest education  level: 12th grade  Occupational History    Employer: RETIRED  Social Needs  . Financial resource strain: Not hard at all  . Food insecurity    Worry: Never true    Inability: Never true  . Transportation needs    Medical: No    Non-medical: No  Tobacco Use  . Smoking status: Former Smoker    Packs/day: 0.30    Years: 30.00    Pack years: 9.00    Types: Cigarettes  . Smokeless tobacco: Never Used  . Tobacco comment: patient said that she has not touched a cigarette in a couple of months  Substance and Sexual Activity  . Alcohol use: No    Alcohol/week: 0.0 standard drinks  . Drug use: No  . Sexual activity: Not Currently  Lifestyle  . Physical activity    Days per week: 0 days    Minutes per session: 0 min  . Stress: Not at all  Relationships  . Social Product manager on phone: Twice a week    Gets together: Once a week    Attends religious service: 1 to 4 times per year    Active member of club or organization: No    Attends meetings of clubs or organizations: Never    Relationship status: Widowed  . Intimate partner violence    Fear of current or ex partner: No    Emotionally abused: No    Physically abused: No    Forced sexual activity: No  Other Topics Concern  . Not on file  Social History Narrative  . Not on file     Review of Systems  Constitutional: Negative.   HENT: Negative.   Eyes: Negative.   Respiratory: Negative.   Cardiovascular: Negative.   Gastrointestinal: Negative.   Endocrine: Negative.   Genitourinary: Negative.   Musculoskeletal: Negative.   Skin: Negative.   Allergic/Immunologic: Negative.   Neurological: Negative.   Hematological: Negative.   Psychiatric/Behavioral: Negative.   All other systems reviewed and are negative.      Objective:   Vitals:   08/03/19 1129  BP: 122/60  Pulse: (!) 121  Resp: 14  Temp: (!) 96.6 F (35.9 C)  SpO2: 98%  Weight: 180 lb 11.2 oz (82 kg)  Height: 5\' 4"  (1.626 m)    Body mass index is 31.02 kg/m.  Physical Exam Vitals signs and nursing note reviewed.  Constitutional:      Appearance: She is well-developed.  HENT:     Head: Normocephalic and atraumatic.     Nose: Nose normal.  Eyes:     General:        Right eye: No discharge.        Left eye: No discharge.     Conjunctiva/sclera: Conjunctivae normal.  Neck:     Trachea: No tracheal deviation.  Cardiovascular:     Rate and Rhythm: Normal rate and regular rhythm.  Pulmonary:     Effort: Pulmonary effort is normal. No respiratory distress.     Breath sounds: No stridor.  Musculoskeletal: Normal range of motion.  Skin:    General: Skin is warm and dry.     Findings: Lesion present. No rash.     Comments: 5 mm brown flat top minimally raised lesion, oval in shape, no induration, erythema, drainage,  warmth  Neurological:     Mental Status: She is alert.     Motor: No abnormal muscle tone.     Coordination: Coordination normal.  Psychiatric:  Behavior: Behavior normal.      Results for orders placed or performed in visit on 07/11/19  VITAMIN D 25 Hydroxy (Vit-D Deficiency, Fractures)  Result Value Ref Range   Vit D, 25-Hydroxy 30 30 - 100 ng/mL  Hemoglobin A1c  Result Value Ref Range   Hgb A1c MFr Bld 5.8 (H) <5.7 % of total Hgb   Mean Plasma Glucose 120 (calc)   eAG (mmol/L) 6.6 (calc)  Lipid panel  Result Value Ref Range   Cholesterol 145 <200 mg/dL   HDL 55 > OR = 50 mg/dL   Triglycerides 138 <150 mg/dL   LDL Cholesterol (Calc) 68 mg/dL (calc)   Total CHOL/HDL Ratio 2.6 <5.0 (calc)   Non-HDL Cholesterol (Calc) 90 <130 mg/dL (calc)  CBC with Differential/Platelet  Result Value Ref Range   WBC 7.9 3.8 - 10.8 Thousand/uL   RBC 4.42 3.80 - 5.10 Million/uL   Hemoglobin 14.0 11.7 - 15.5 g/dL   HCT 42.1 35.0 - 45.0 %   MCV 95.2 80.0 - 100.0 fL   MCH 31.7 27.0 - 33.0 pg   MCHC 33.3 32.0 - 36.0 g/dL   RDW 12.7 11.0 - 15.0 %   Platelets 166 140 - 400 Thousand/uL   MPV 12.8 (H) 7.5 - 12.5 fL   Neutro Abs 3,365 1,500 - 7,800 cells/uL   Lymphs Abs 3,587 850 - 3,900 cells/uL   Absolute Monocytes 640 200 - 950 cells/uL   Eosinophils Absolute 237 15 - 500 cells/uL   Basophils Absolute 71 0 - 200 cells/uL   Neutrophils Relative % 42.6 %   Total Lymphocyte 45.4 %   Monocytes Relative 8.1 %   Eosinophils Relative 3.0 %   Basophils Relative 0.9 %  COMPLETE METABOLIC PANEL WITH GFR  Result Value Ref Range   Glucose, Bld 118 (H) 65 - 99 mg/dL   BUN 8 7 - 25 mg/dL   Creat 0.90 0.60 - 0.93 mg/dL   GFR, Est Non African American 61 > OR = 60 mL/min/1.51m2   GFR, Est African American 70 > OR = 60 mL/min/1.28m2   BUN/Creatinine Ratio NOT APPLICABLE 6 - 22 (calc)   Sodium 140 135 - 146 mmol/L   Potassium 4.1 3.5 - 5.3 mmol/L   Chloride 105 98 - 110 mmol/L   CO2 25 20 - 32  mmol/L   Calcium 9.6 8.6 - 10.4 mg/dL   Total Protein 6.4 6.1 - 8.1 g/dL   Albumin 4.2 3.6 - 5.1 g/dL   Globulin 2.2 1.9 - 3.7 g/dL (calc)   AG Ratio 1.9 1.0 - 2.5 (calc)   Total Bilirubin 0.6 0.2 - 1.2 mg/dL   Alkaline phosphatase (APISO) 104 37 - 153 U/L   AST 37 (H) 10 - 35 U/L   ALT 34 (H) 6 - 29 U/L        Assessment & Plan:      ICD-10-CM   1. Skin lesion of left lower extremity  L98.9     Skin lesion is normal-appearing to me as an concerning but I have offered the patient to come back to do a shave or punch biopsy.  It appears like benign nevi versus seborrheic keratosis.  There is no skin erythema, edema, induration warmth.  The color and shape of the mole is very uniform.  I did offer to refer her to dermatology but she is very anxious she did have some tachycardia when she first got into the room which improved after sitting and talking for little bit,  she would like to come back here to get a biopsy sooner    Delsa Grana, PA-C 08/03/19 12:05 PM

## 2019-08-17 ENCOUNTER — Encounter: Payer: Self-pay | Admitting: Family Medicine

## 2019-08-17 ENCOUNTER — Other Ambulatory Visit: Payer: Self-pay

## 2019-08-17 ENCOUNTER — Ambulatory Visit (INDEPENDENT_AMBULATORY_CARE_PROVIDER_SITE_OTHER): Payer: PPO | Admitting: Family Medicine

## 2019-08-17 VITALS — BP 122/64 | HR 74 | Temp 98.1°F | Resp 14 | Ht 64.0 in | Wt 182.4 lb

## 2019-08-17 DIAGNOSIS — L989 Disorder of the skin and subcutaneous tissue, unspecified: Secondary | ICD-10-CM

## 2019-08-17 MED ORDER — LIDOCAINE HCL (PF) 1 % IJ SOLN
1.5000 mL | Freq: Once | INTRAMUSCULAR | Status: AC
  Administered 2019-08-17: 1.5 mL via INTRADERMAL

## 2019-08-17 NOTE — Progress Notes (Signed)
Patient ID: Leslie Patel, female    DOB: [birth year], 77 y.o.   MRN: [id number]  PCP: Leslie Sizer, MD  Chief Complaint  Patient presents with  . Nevus    mole biopsy left lower leg    Subjective:   Leslie Patel is a 78 y.o. female, presents to clinic with CC of the following:  Here for shave biopsy on skin lesion - procedure only visit - see previous HPI No change to size since last OV.  Still has some minor burning, but overall itching and burning better.  No redness or swelling.  She still wishes to proceed with procedure due to past skin CA.    Current Outpatient Medications:  .  Calcium Carb-Cholecalciferol 600-100 MG-UNIT CAPS, Take 1 tablet by mouth daily., Disp: , Rfl:  .  fluticasone (FLONASE) 50 MCG/ACT nasal spray, Place 2 sprays into both nostrils daily. (Patient not taking: Reported on 08/03/2019), Disp: 16 g, Rfl: 6 .  latanoprost (XALATAN) 0.005 % ophthalmic solution, Place 1 drop into both eyes at bedtime., Disp: , Rfl:  .  potassium chloride SA (K-DUR) 20 MEQ tablet, Take 1 tablet (20 mEq total) by mouth 4 (four) times daily., Disp: 360 tablet, Rfl: 0 .  simvastatin (ZOCOR) 40 MG tablet, Take 1 tablet (40 mg total) by mouth daily. for cholesterol, Disp: 90 tablet, Rfl: 1   Allergies  Allergen Reactions  . Darvon [Propoxyphene] Anaphylaxis  . Loratadine Anaphylaxis  . Cephalosporins   . Codeine   . Eggs Or Egg-Derived Products   . Paroxetine   . Penicillins   . Sulfa Antibiotics   . Xanax  [Alprazolam]   . Percocet [Oxycodone-Acetaminophen] Itching, Rash and Other (See Comments)    wheezing     Family History  Problem Relation Age of Onset  . Tuberculosis Mother   . Cancer Father        leukemia  . Cancer Daughter        Breast  . Diabetes Daughter   . Rheum arthritis Daughter   . Obesity Daughter   . Breast cancer Daughter 57  . Throat cancer Daughter   . Other Brother        spinal meningitis  . Kidney failure Granddaughter       Social History   Socioeconomic History  . Marital status: Widowed    Spouse name: Not on file  . Number of children: 2  . Years of education: some college  . Highest education level: 12th grade  Occupational History    Employer: RETIRED  Social Needs  . Financial resource strain: Not hard at all  . Food insecurity    Worry: Never true    Inability: Never true  . Transportation needs    Medical: No    Non-medical: No  Tobacco Use  . Smoking status: Former Smoker    Packs/day: 0.30    Years: 30.00    Pack years: 9.00    Types: Cigarettes  . Smokeless tobacco: Never Used  . Tobacco comment: patient said that she has not touched a cigarette in a couple of months  Substance and Sexual Activity  . Alcohol use: No    Alcohol/week: 0.0 standard drinks  . Drug use: No  . Sexual activity: Not Currently  Lifestyle  . Physical activity    Days per week: 0 days    Minutes per session: 0 min  . Stress: Not at all  Relationships  . Social connections  Talks on phone: Twice a week    Gets together: Once a week    Attends religious service: 1 to 4 times per year    Active member of club or organization: No    Attends meetings of clubs or organizations: Never    Relationship status: Widowed  . Intimate partner violence    Fear of current or ex partner: No    Emotionally abused: No    Physically abused: No    Forced sexual activity: No  Other Topics Concern  . Not on file  Social History Narrative  . Not on file    I personally reviewed active problem list, medication list, allergies with the patient/caregiver today.  Review of Systems  Constitutional: Negative.   HENT: Negative.   Eyes: Negative.   Respiratory: Negative.   Cardiovascular: Negative.   Gastrointestinal: Negative.   Endocrine: Negative.   Genitourinary: Negative.   Musculoskeletal: Negative.   Skin: Negative.   Allergic/Immunologic: Negative.   Neurological: Negative.   Hematological: Negative.    Psychiatric/Behavioral: Negative.   All other systems reviewed and are negative.      Objective:   Vitals:   08/17/19 1427  BP: 122/64  Pulse: 74  Resp: 14  Temp: 98.1 F (36.7 C)  SpO2: 99%  Weight: 182 lb 6.4 oz (82.7 kg)  Height: 5\' 4"  (1.626 m)    Body mass index is 31.31 kg/m.  Physical Exam Vitals signs and nursing note reviewed.  Constitutional:      General: She is not in acute distress.    Appearance: Normal appearance. She is well-developed. She is not ill-appearing, toxic-appearing or diaphoretic.     Comments: Well appearing pleasant elderly female, NAD  HENT:     Head: Normocephalic and atraumatic.  Eyes:     General:        Right eye: No discharge.        Left eye: No discharge.     Conjunctiva/sclera: Conjunctivae normal.  Neck:     Trachea: No tracheal deviation.  Cardiovascular:     Rate and Rhythm: Normal rate and regular rhythm.  Pulmonary:     Effort: Pulmonary effort is normal. No respiratory distress.     Breath sounds: No stridor.  Musculoskeletal: Normal range of motion.  Skin:    General: Skin is warm and dry.     Findings: Lesion present. No rash.     Comments: 80mmx 13mm brown flat top minimally raised lesion, oval in shape, no induration, erythema, drainage, warmth  Neurological:     Mental Status: She is alert.     Motor: No abnormal muscle tone.     Coordination: Coordination normal.  Psychiatric:        Behavior: Behavior normal.       Procedure: Shave biopsy  Meds, vitals, and allergies reviewed.   Indication: suspicious lesion to left lower leg, chane noticed by patient, itching burning  Location:  Left lower outer leg  Size:  46mm x 19mm, flat, brown, slightly raised  Verbal informed consent obtained.  Pt aware of risks not limited to but including infection,  bleeding, damage to near by organs.  Prep: etoh/betadine  Anesthesia:  1 mL 1% lidocaine without epi, good effect  Shave made with dermablade  Minimal  blood loss, controlled with direct pressure, hemostasis achieved, abx ointment and bandage applied  Tolerated well  Routine postprocedure instructions d/w pt- keep area clean and bandaged, follow up if concerns/spreading erythema/pain.     Assessment &  Plan:      ICD-10-CM   1. Skin lesion of left lower extremity  L98.9 Pathology ([facility])    Tolerated well, wound care reviewed, will contact pt in the next week with pathology results and needed follow up.    Delsa Grana, PA-C 08/17/19 2:32 PM

## 2019-08-17 NOTE — Patient Instructions (Signed)
For the next couple days keep covered and apply antibiotic ointment.  We will call you with the pathology results and the plan.   Wound Care, Adult Taking care of your wound properly can help to prevent pain, infection, and scarring. It can also help your wound to heal more quickly. How to care for your wound Wound care      Follow instructions from your health care provider about how to take care of your wound. Make sure you: ? Wash your hands with soap and water before you change the bandage (dressing). If soap and water are not available, use hand sanitizer. ? Change your dressing as told by your health care provider. ? Leave stitches (sutures), skin glue, or adhesive strips in place. These skin closures may need to stay in place for 2 weeks or longer. If adhesive strip edges start to loosen and curl up, you may trim the loose edges. Do not remove adhesive strips completely unless your health care provider tells you to do that.  Check your wound area every day for signs of infection. Check for: ? Redness, swelling, or pain. ? Fluid or blood. ? Warmth. ? Pus or a bad smell.  Ask your health care provider if you should clean the wound with mild soap and water. Doing this may include: ? Using a clean towel to pat the wound dry after cleaning it. Do not rub or scrub the wound. ? Applying a cream or ointment. Do this only as told by your health care provider. ? Covering the incision with a clean dressing.  Ask your health care provider when you can leave the wound uncovered.  Keep the dressing dry until your health care provider says it can be removed. Do not take baths, swim, use a hot tub, or do anything that would put the wound underwater until your health care provider approves. Ask your health care provider if you can take showers. You may only be allowed to take sponge baths. Medicines   If you were prescribed an antibiotic medicine, cream, or ointment, take or use the  antibiotic as told by your health care provider. Do not stop taking or using the antibiotic even if your condition improves.  Take over-the-counter and prescription medicines only as told by your health care provider. If you were prescribed pain medicine, take it 30 or more minutes before you do any wound care or as told by your health care provider. General instructions  Return to your normal activities as told by your health care provider. Ask your health care provider what activities are safe.  Do not scratch or pick at the wound.  Do not use any products that contain nicotine or tobacco, such as cigarettes and e-cigarettes. These may delay wound healing. If you need help quitting, ask your health care provider.  Keep all follow-up visits as told by your health care provider. This is important.  Eat a diet that includes protein, vitamin A, vitamin C, and other nutrient-rich foods to help the wound heal. ? Foods rich in protein include meat, dairy, beans, nuts, and other sources. ? Foods rich in vitamin A include carrots and dark green, leafy vegetables. ? Foods rich in vitamin C include citrus, tomatoes, and other fruits and vegetables. ? Nutrient-rich foods have protein, carbohydrates, fat, vitamins, or minerals. Eat a variety of healthy foods including vegetables, fruits, and whole grains. Contact a health care provider if:  You received a tetanus shot and you have swelling, severe pain, redness,  or bleeding at the injection site.  Your pain is not controlled with medicine.  You have redness, swelling, or pain around the wound.  You have fluid or blood coming from the wound.  Your wound feels warm to the touch.  You have pus or a bad smell coming from the wound.  You have a fever or chills.  You are nauseous or you vomit.  You are dizzy. Get help right away if:  You have a red streak going away from your wound.  The edges of the wound open up and separate.  Your wound  is bleeding, and the bleeding does not stop with gentle pressure.  You have a rash.  You faint.  You have trouble breathing. Summary  Always wash your hands with soap and water before changing your bandage (dressing).  To help with healing, eat foods that are rich in protein, vitamin A, vitamin C, and other nutrients.  Check your wound every day for signs of infection. Contact your health care provider if you suspect that your wound is infected. This information is not intended to replace advice given to you by your health care provider. Make sure you discuss any questions you have with your health care provider. Document Released: 07/27/2008 Document Revised: 02/05/2019 Document Reviewed: 05/04/2016 Elsevier Patient Education  2020 [employer].

## 2019-08-17 NOTE — Addendum Note (Signed)
Addended by: Docia Furl on: 08/17/2019 03:09 PM   Modules accepted: Orders

## 2019-08-20 DIAGNOSIS — L989 Disorder of the skin and subcutaneous tissue, unspecified: Secondary | ICD-10-CM | POA: Diagnosis not present

## 2019-08-20 DIAGNOSIS — L818 Other specified disorders of pigmentation: Secondary | ICD-10-CM | POA: Diagnosis not present

## 2019-08-20 DIAGNOSIS — L570 Actinic keratosis: Secondary | ICD-10-CM | POA: Diagnosis not present

## 2019-08-22 LAB — PATHOLOGY REPORT

## 2019-08-22 LAB — TISSUE SPECIMEN

## 2019-08-30 ENCOUNTER — Telehealth: Payer: Self-pay | Admitting: Family Medicine

## 2019-08-30 NOTE — Chronic Care Management (AMB) (Signed)
Chronic Care Management   Note  08/30/2019 Name: Leslie Patel MRN: [id number] DOB: Mar 15, 1941  Leslie Patel is a 78 y.o. year old female who is a primary care patient of Steele Sizer, MD. I reached out to Achille Rich by phone today in response to a referral sent by Ms. Vertis Kelch Aden's health plan.     [name] was given information about Chronic Care Management services today including:  1. CCM service includes personalized support from designated clinical staff supervised by her physician, including individualized plan of care and coordination with other care providers 2. 24/7 contact phone numbers for assistance for urgent and routine care needs. 3. Service will only be billed when office clinical staff spend 20 minutes or more in a month to coordinate care. 4. Only one practitioner may furnish and bill the service in a calendar month. 5. The patient may stop CCM services at any time (effective at the end of the month) by phone call to the office staff. 6. The patient will be responsible for cost sharing (co-pay) of up to 20% of the service fee (after annual deductible is met).  Patient did not agree to enrollment in care management services and does not wish to consider at this time.  Follow up plan: The patient has been provided with contact information for the chronic care management team and has been advised to call with any health related questions or concerns.   [place]  [email or web address]   ??[phone]

## 2019-09-11 DIAGNOSIS — H40153 Residual stage of open-angle glaucoma, bilateral: Secondary | ICD-10-CM | POA: Diagnosis not present

## 2020-01-08 ENCOUNTER — Ambulatory Visit (INDEPENDENT_AMBULATORY_CARE_PROVIDER_SITE_OTHER): Payer: PPO | Admitting: Family Medicine

## 2020-01-08 ENCOUNTER — Encounter: Payer: Self-pay | Admitting: Family Medicine

## 2020-01-08 DIAGNOSIS — E876 Hypokalemia: Secondary | ICD-10-CM | POA: Diagnosis not present

## 2020-01-08 DIAGNOSIS — E559 Vitamin D deficiency, unspecified: Secondary | ICD-10-CM

## 2020-01-08 DIAGNOSIS — F330 Major depressive disorder, recurrent, mild: Secondary | ICD-10-CM | POA: Diagnosis not present

## 2020-01-08 DIAGNOSIS — Z8679 Personal history of other diseases of the circulatory system: Secondary | ICD-10-CM

## 2020-01-08 DIAGNOSIS — F5101 Primary insomnia: Secondary | ICD-10-CM | POA: Diagnosis not present

## 2020-01-08 DIAGNOSIS — R739 Hyperglycemia, unspecified: Secondary | ICD-10-CM | POA: Diagnosis not present

## 2020-01-08 DIAGNOSIS — E785 Hyperlipidemia, unspecified: Secondary | ICD-10-CM

## 2020-01-08 DIAGNOSIS — E8881 Metabolic syndrome: Secondary | ICD-10-CM | POA: Diagnosis not present

## 2020-01-08 DIAGNOSIS — D692 Other nonthrombocytopenic purpura: Secondary | ICD-10-CM | POA: Diagnosis not present

## 2020-01-08 DIAGNOSIS — M810 Age-related osteoporosis without current pathological fracture: Secondary | ICD-10-CM | POA: Diagnosis not present

## 2020-01-08 MED ORDER — POTASSIUM CHLORIDE CRYS ER 20 MEQ PO TBCR: 20.0000 meq | EXTENDED_RELEASE_TABLET | Freq: Four times a day (QID) | ORAL | 1 refills | Status: DC

## 2020-01-08 MED ORDER — SIMVASTATIN 40 MG PO TABS: 40.0000 mg | ORAL_TABLET | Freq: Every day | ORAL | 1 refills | Status: DC
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
## 2020-02-06 DIAGNOSIS — H40153 Residual stage of open-angle glaucoma, bilateral: Secondary | ICD-10-CM | POA: Diagnosis not present

## 2020-04-09 ENCOUNTER — Other Ambulatory Visit: Payer: Self-pay | Admitting: Family Medicine

## 2020-04-09 DIAGNOSIS — E785 Hyperlipidemia, unspecified: Secondary | ICD-10-CM

## 2020-04-28 DIAGNOSIS — H40153 Residual stage of open-angle glaucoma, bilateral: Secondary | ICD-10-CM | POA: Diagnosis not present
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
## 2020-07-15 ENCOUNTER — Other Ambulatory Visit: Payer: Self-pay

## 2020-07-15 ENCOUNTER — Ambulatory Visit (INDEPENDENT_AMBULATORY_CARE_PROVIDER_SITE_OTHER): Payer: PPO

## 2020-07-15 ENCOUNTER — Encounter: Payer: Self-pay | Admitting: Family Medicine

## 2020-07-15 ENCOUNTER — Ambulatory Visit (INDEPENDENT_AMBULATORY_CARE_PROVIDER_SITE_OTHER): Payer: PPO | Admitting: Family Medicine

## 2020-07-15 VITALS — BP 138/80 | HR 78 | Temp 98.0°F | Resp 18 | Ht 64.0 in | Wt 183.9 lb

## 2020-07-15 DIAGNOSIS — D692 Other nonthrombocytopenic purpura: Secondary | ICD-10-CM

## 2020-07-15 DIAGNOSIS — E876 Hypokalemia: Secondary | ICD-10-CM

## 2020-07-15 DIAGNOSIS — E785 Hyperlipidemia, unspecified: Secondary | ICD-10-CM

## 2020-07-15 DIAGNOSIS — Z1231 Encounter for screening mammogram for malignant neoplasm of breast: Secondary | ICD-10-CM

## 2020-07-15 DIAGNOSIS — M810 Age-related osteoporosis without current pathological fracture: Secondary | ICD-10-CM

## 2020-07-15 DIAGNOSIS — Z0000 Encounter for general adult medical examination without abnormal findings: Secondary | ICD-10-CM | POA: Diagnosis not present

## 2020-07-15 DIAGNOSIS — F330 Major depressive disorder, recurrent, mild: Secondary | ICD-10-CM

## 2020-07-15 DIAGNOSIS — R739 Hyperglycemia, unspecified: Secondary | ICD-10-CM | POA: Diagnosis not present

## 2020-07-15 DIAGNOSIS — R600 Localized edema: Secondary | ICD-10-CM

## 2020-07-15 DIAGNOSIS — E559 Vitamin D deficiency, unspecified: Secondary | ICD-10-CM | POA: Diagnosis not present

## 2020-07-15 DIAGNOSIS — F5101 Primary insomnia: Secondary | ICD-10-CM

## 2020-07-15 DIAGNOSIS — E8881 Metabolic syndrome: Secondary | ICD-10-CM

## 2020-07-15 MED ORDER — POTASSIUM CHLORIDE CRYS ER 20 MEQ PO TBCR: 20.0000 meq | EXTENDED_RELEASE_TABLET | Freq: Four times a day (QID) | ORAL | 1 refills | Status: DC

## 2020-07-15 MED ORDER — SIMVASTATIN 40 MG PO TABS: 40.0000 mg | ORAL_TABLET | Freq: Every day | ORAL | 1 refills | Status: DC

## 2020-07-15 NOTE — Patient Instructions (Signed)
Leslie Patel , Thank you for taking time to come for your Medicare Wellness Visit. I appreciate your ongoing commitment to your health goals. Please review the following plan we discussed and let me know if I can assist you in the future.   Screening recommendations/referrals: Colonoscopy: no longer required  Mammogram: done 11/17/17. Please call [phone] to schedule your mammogram.  Bone Density: done 01/03/15 Recommended yearly ophthalmology/optometry visit for glaucoma screening and checkup Recommended yearly dental visit for hygiene and checkup  Vaccinations: Influenza vaccine: declined Pneumococcal vaccine: declined Tdap vaccine: done 07/08/15 Shingles vaccine: declined   Covid-19:declined  Advanced directives: Advance directive discussed with you today. Even though you declined this today please call our office should you change your mind and we can give you the proper paperwork for you to fill out.  Conditions/risks identified: Recommend increasing physical activity   Next appointment: Follow up in one year for your annual wellness visit    Preventive Care 65 Years and Older, Female Preventive care refers to lifestyle choices and visits with your health care provider that can promote health and wellness. What does preventive care include?  A yearly physical exam. This is also called an annual well check.  Dental exams once or twice a year.  Routine eye exams. Ask your health care provider how often you should have your eyes checked.  Personal lifestyle choices, including:  Daily care of your teeth and gums.  Regular physical activity.  Eating a healthy diet.  Avoiding tobacco and drug use.  Limiting alcohol use.  Practicing safe sex.  Taking low-dose aspirin every day.  Taking vitamin and mineral supplements as recommended by your health care provider. What happens during an annual well check? The services and screenings done by your health care provider during  your annual well check will depend on your age, overall health, lifestyle risk factors, and family history of disease. Counseling  Your health care provider may ask you questions about your:  Alcohol use.  Tobacco use.  Drug use.  Emotional well-being.  Home and relationship well-being.  Sexual activity.  Eating habits.  History of falls.  Memory and ability to understand (cognition).  Work and work Statistician.  Reproductive health. Screening  You may have the following tests or measurements:  Height, weight, and BMI.  Blood pressure.  Lipid and cholesterol levels. These may be checked every 5 years, or more frequently if you are over 55 years old.  Skin check.  Lung cancer screening. You may have this screening every year starting at age 73 if you have a 30-pack-year history of smoking and currently smoke or have quit within the past 15 years.  Fecal occult blood test (FOBT) of the stool. You may have this test every year starting at age 63.  Flexible sigmoidoscopy or colonoscopy. You may have a sigmoidoscopy every 5 years or a colonoscopy every 10 years starting at age 25.  Hepatitis C blood test.  Hepatitis B blood test.  Sexually transmitted disease (STD) testing.  Diabetes screening. This is done by checking your blood sugar (glucose) after you have not eaten for a while (fasting). You may have this done every 1-3 years.  Bone density scan. This is done to screen for osteoporosis. You may have this done starting at age 32.  Mammogram. This may be done every 1-2 years. Talk to your health care provider about how often you should have regular mammograms. Talk with your health care provider about your test results, treatment options, and  if necessary, the need for more tests. Vaccines  Your health care provider may recommend certain vaccines, such as:  Influenza vaccine. This is recommended every year.  Tetanus, diphtheria, and acellular pertussis (Tdap,  Td) vaccine. You may need a Td booster every 10 years.  Zoster vaccine. You may need this after age 64.  Pneumococcal 13-valent conjugate (PCV13) vaccine. One dose is recommended after age 83.  Pneumococcal polysaccharide (PPSV23) vaccine. One dose is recommended after age 64. Talk to your health care provider about which screenings and vaccines you need and how often you need them. This information is not intended to replace advice given to you by your health care provider. Make sure you discuss any questions you have with your health care provider. Document Released: 11/14/2015 Document Revised: 07/07/2016 Document Reviewed: 08/19/2015 Elsevier Interactive Patient Education  2017 [place] Prevention in the Home Falls can cause injuries. They can happen to people of all ages. There are many things you can do to make your home safe and to help prevent falls. What can I do on the outside of my home?  Regularly fix the edges of walkways and driveways and fix any cracks.  Remove anything that might make you trip as you walk through a door, such as a raised step or threshold.  Trim any bushes or trees on the path to your home.  Use bright outdoor lighting.  Clear any walking paths of anything that might make someone trip, such as rocks or tools.  Regularly check to see if handrails are loose or broken. Make sure that both sides of any steps have handrails.  Any raised decks and porches should have guardrails on the edges.  Have any leaves, snow, or ice cleared regularly.  Use sand or salt on walking paths during winter.  Clean up any spills in your garage right away. This includes oil or grease spills. What can I do in the bathroom?  Use night lights.  Install grab bars by the toilet and in the tub and shower. Do not use towel bars as grab bars.  Use non-skid mats or decals in the tub or shower.  If you need to sit down in the shower, use a plastic, non-slip  stool.  Keep the floor dry. Clean up any water that spills on the floor as soon as it happens.  Remove soap buildup in the tub or shower regularly.  Attach bath mats securely with double-sided non-slip rug tape.  Do not have throw rugs and other things on the floor that can make you trip. What can I do in the bedroom?  Use night lights.  Make sure that you have a light by your bed that is easy to reach.  Do not use any sheets or blankets that are too big for your bed. They should not hang down onto the floor.  Have a firm chair that has side arms. You can use this for support while you get dressed.  Do not have throw rugs and other things on the floor that can make you trip. What can I do in the kitchen?  Clean up any spills right away.  Avoid walking on wet floors.  Keep items that you use a lot in easy-to-reach places.  If you need to reach something above you, use a strong step stool that has a grab bar.  Keep electrical cords out of the way.  Do not use floor polish or wax that makes floors slippery. If you  must use wax, use non-skid floor wax.  Do not have throw rugs and other things on the floor that can make you trip. What can I do with my stairs?  Do not leave any items on the stairs.  Make sure that there are handrails on both sides of the stairs and use them. Fix handrails that are broken or loose. Make sure that handrails are as long as the stairways.  Check any carpeting to make sure that it is firmly attached to the stairs. Fix any carpet that is loose or worn.  Avoid having throw rugs at the top or bottom of the stairs. If you do have throw rugs, attach them to the floor with carpet tape.  Make sure that you have a light switch at the top of the stairs and the bottom of the stairs. If you do not have them, ask someone to add them for you. What else can I do to help prevent falls?  Wear shoes that:  Do not have high heels.  Have rubber bottoms.  Are  comfortable and fit you well.  Are closed at the toe. Do not wear sandals.  If you use a stepladder:  Make sure that it is fully opened. Do not climb a closed stepladder.  Make sure that both sides of the stepladder are locked into place.  Ask someone to hold it for you, if possible.  Clearly mark and make sure that you can see:  Any grab bars or handrails.  First and last steps.  Where the edge of each step is.  Use tools that help you move around (mobility aids) if they are needed. These include:  Canes.  Walkers.  Scooters.  Crutches.  Turn on the lights when you go into a dark area. Replace any light bulbs as soon as they burn out.  Set up your furniture so you have a clear path. Avoid moving your furniture around.  If any of your floors are uneven, fix them.  If there are any pets around you, be aware of where they are.  Review your medicines with your doctor. Some medicines can make you feel dizzy. This can increase your chance of falling. Ask your doctor what other things that you can do to help prevent falls. This information is not intended to replace advice given to you by your health care provider. Make sure you discuss any questions you have with your health care provider. Document Released: 08/14/2009 Document Revised: 03/25/2016 Document Reviewed: 11/22/2014 Elsevier Interactive Patient Education  2017 [employer].

## 2020-07-15 NOTE — Progress Notes (Signed)
Subjective:   Leslie Patel is a 79 y.o. female who presents for Medicare Annual (Subsequent) preventive examination.  Review of Systems    Cardiac Risk Factors include: advanced age (>80mn, >>3women);dyslipidemia;sedentary lifestyle;hypertension     Objective:    Today's Vitals   07/15/20 1431  BP: 138/80  Pulse: 78  Resp: 18  Temp: 98 F (36.7 C)  TempSrc: Oral  SpO2: 98%  Weight: 183 lb 14.4 oz (83.4 kg)  Height: _0  (1.626 m)   Body mass index is 31.57 kg/m.  Advanced Directives 07/15/2020 06/09/2018 07/08/2017 01/04/2017 07/06/2016 01/02/2016 07/08/2015  Does Patient Have a Medical Advance Directive? _1  No No  Would patient like information on creating a medical advance directive? No - Patient declined Yes (MAU/Ambulatory/Procedural Areas - Information given) - - No - patient declined information No - patient declined information -    Current Medications (verified) Outpatient Encounter Medications as of 07/15/2020  Medication Sig  . Calcium Carb-Cholecalciferol 600-100 MG-UNIT CAPS Take 1 tablet by mouth daily.  . fluticasone (FLONASE) 50 MCG/ACT nasal spray Place 2 sprays into both nostrils daily.  .Marland Kitchenlatanoprost (XALATAN) 0.005 % ophthalmic solution Place 1 drop into both eyes at bedtime.  . potassium chloride SA (KLOR-CON) 20 MEQ tablet Take 1 tablet (20 mEq total) by mouth 4 (four) times daily.  . simvastatin (ZOCOR) 40 MG tablet Take 1 tablet (40 mg total) by mouth daily.   No facility-administered encounter medications on file as of 07/15/2020.    Allergies (verified) Darvon [propoxyphene], Loratadine, Cephalosporins, Codeine, Eggs or egg-derived products, Paroxetine, Penicillins, Sulfa antibiotics, Xanax  [alprazolam], and Percocet [oxycodone-acetaminophen]   History: Past Medical History:  Diagnosis Date  . Allergy   . Bilateral cataracts   . [place]   Left breast, 2.1 cm histologic grade 2, pT2,N1 (mic),M0. 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  . Cancer ([facility] 2006   left breast with radiation and chemo  . Dyslipidemia   . Edema   . Head injury 1995  . History of breast cancer   . History of chemotherapy 2006  . History of radiation therapy 2006  . Hx of rheumatic fever   . Hypertension   . Hypokalemia   . Insomnia   . Low blood potassium   . Metabolic syndrome   . Osteoporosis   . Personal history of chemotherapy   . Personal history of radiation therapy   . Shingles 2006  . Stress at home   . Symptomatic menopausal or female climacteric states   . Vitamin D deficiency    Past Surgical History:  Procedure Laterality Date  . ABDOMINAL HYSTERECTOMY    . BREAST BIOPSY Left 2006   invasive lobular carcinoma  . BREAST BIOPSY Right 10/22/2014   Fibrocystic changes, pseudo-angiomatous stromal hyperplasia.  .Marland KitchenBREAST EXCISIONAL BIOPSY Right 1991   benign  . BREAST LUMPECTOMY Left 09/2005   invasive lobular carcinoma clear margins  . BREAST SURGERY Left 2006   lumpectomy  . CATARACT EXTRACTION, BILATERAL     Left-07/2014 Right-06-25-14  . fracture left arm     Repair  . PORT-A-CATH REMOVAL  11/2007  . PORTACATH PLACEMENT  2006   Family History  Problem Relation Age of Onset  . Tuberculosis Mother   . Cancer Father        leukemia  . Cancer Daughter        Breast  . Diabetes Daughter   . Rheum arthritis Daughter   .  Obesity Daughter   . Breast cancer Daughter 29  . Throat cancer Daughter   . Other Brother        spinal meningitis  . Kidney failure Granddaughter    Social History   Socioeconomic History  . Marital status: Widowed    Spouse name: Not on file  . Number of children: 2  . Years of education: some college  . Highest education level: 12th grade  Occupational History    Employer: RETIRED  Tobacco Use  . Smoking status: Former Smoker    Packs/day: 0.30    Years: 30.00    Pack years: 9.00    Types: Cigarettes  . Smokeless tobacco: Never Used  . Tobacco comment:  patient said that she has not touched a cigarette in a couple of months  Vaping Use  . Vaping Use: Never used  Substance and Sexual Activity  . Alcohol use: No    Alcohol/week: 0.0 standard drinks  . Drug use: No  . Sexual activity: Not Currently  Other Topics Concern  . Not on file  Social History Narrative  . Not on file   Social Determinants of Health   Financial Resource Strain: Low Risk   . Difficulty of Paying Living Expenses: Not hard at all  Food Insecurity: No Food Insecurity  . Worried About Charity fundraiser in the Last Year: Never true  . Ran Out of Food in the Last Year: Never true  Transportation Needs: No Transportation Needs  . Lack of Transportation (Medical): No  . Lack of Transportation (Non-Medical): No  Physical Activity: Inactive  . Days of Exercise per Week: 0 days  . Minutes of Exercise per Session: 0 min  Stress: Stress Concern Present  . Feeling of Stress : To some extent  Social Connections: Unknown  . Frequency of Communication with Friends and Family: Not on file  . Frequency of Social Gatherings with Friends and Family: Twice a week  . Attends Religious Services: Never  . Active Member of Clubs or Organizations: No  . Attends Archivist Meetings: Never  . Marital Status: Widowed    Tobacco Counseling Counseling given: Not Answered Comment: patient said that she has not touched a cigarette in a couple of months   Clinical Intake:  Pre-visit preparation completed: Yes  Pain : No/denies pain     BMI - recorded: 31.57 Nutritional Status: BMI > 30  Obese Nutritional Risks: None Diabetes: No  How often do you need to have someone help you when you read instructions, pamphlets, or other written materials from your doctor or pharmacy?: 1 - Never    Interpreter Needed?: No  Information entered by :: Clemetine Marker LPN   Activities of Daily Living In your present state of health, do you have any difficulty performing the  following activities: 07/15/2020 07/15/2020  Hearing? N N  Comment declines hearing aids -  Vision? N N  Difficulty concentrating or making decisions? N N  Walking or climbing stairs? N N  Dressing or bathing? N N  Doing errands, shopping? N N  Preparing Food and eating ? N -  Using the Toilet? N -  In the past six months, have you accidently leaked urine? N -  Do you have problems with loss of bowel control? N -  Managing your Medications? N -  Managing your Finances? N -  Housekeeping or managing your Housekeeping? N -  Some recent data might be hidden    Patient Care Team:  Steele Sizer, MD as PCP - General (Family Medicine) Bary Castilla Forest Gleason, MD (General Surgery)  Indicate any recent Medical Services you may have received from other than [facility] providers in the past year (date may be approximate).     Assessment:   This is a routine wellness examination for [place].  Hearing/Vision screen  Hearing Screening   _0  _1  _2  _3  _4  _5  _6  _7  _8   Right ear:           Left ear:           Comments: Pt denies hearing difficulty  Vision Screening Comments: Annual vision screenings with Dr. Gloriann Loan  Dietary issues and exercise activities discussed: Current Exercise Habits: The patient does not participate in regular exercise at present, Exercise limited by: None identified  Goals    . DIET - INCREASE WATER INTAKE     Recommend to drink at least 6-8 8oz glasses of water per day.      Depression Screen PHQ 2/9 Scores 07/15/2020 07/15/2020 01/08/2020 08/03/2019 07/11/2019 01/08/2019 11/24/2018  PHQ - 2 Score 0 0 5 0 0 0 3  PHQ- 9 Score - 0 9 0 0 2 10    Fall Risk Fall Risk  07/15/2020 07/15/2020 01/08/2020 08/03/2019 07/11/2019  Falls in the past year? 0 0 0 0 0  Number falls in past yr: 0 0 0 0 0  Injury with Fall? 0 0 0 0 0  Comment - - - - -  Risk for fall due to : No Fall Risks - - - -  Risk for fall due to: Comment - - - - -  Follow up Falls prevention  discussed - - - -    Any stairs in or around the home? No  If so, are there any without handrails? No  Home free of loose throw rugs in walkways, pet beds, electrical cords, etc? Yes  Adequate lighting in your home to reduce risk of falls? Yes   ASSISTIVE DEVICES UTILIZED TO PREVENT FALLS:  Life alert? No  Use of a cane, walker or w/c? No  Grab bars in the bathroom? Yes  Shower chair or bench in shower? Yes  Elevated toilet seat or a handicapped toilet? No   TIMED UP AND GO:  Was the test performed? Yes .  Length of time to ambulate 10 feet: 5 sec.   Gait steady and fast without use of assistive device  Cognitive Function: pt declined 6CIT for 2021 AWV     6CIT Screen 06/09/2018  What Year? 0 points  What month? 0 points  What time? 0 points  Count back from 20 0 points  Months in reverse 4 points  Repeat phrase 2 points  Total Score 6    Immunizations Immunization History  Administered Date(s) Administered  . Influenza-Unspecified 08/13/2014, 09/01/2018  . Pneumococcal Conjugate-13 01/03/2015  . Tdap 05/01/2005, 07/08/2015  . Zoster 12/01/2012    TDAP status: Up to date    Flu Vaccine status: Declined, Education has been provided regarding the importance of this vaccine but patient still declined. Advised may receive this vaccine at local pharmacy or Health Dept. Aware to provide a copy of the vaccination record if obtained from local pharmacy or Health Dept. Verbalized acceptance and understanding.   Pneumococcal vaccine status: Declined,  Education has been provided regarding the importance of this vaccine but patient still declined. Advised may receive this vaccine at local pharmacy or Health Dept. Aware to provide a copy of the vaccination record  if obtained from local pharmacy or Health Dept. Verbalized acceptance and understanding.    Covid-19 vaccine status: Declined, Education has been provided regarding the importance of this vaccine but patient still  declined. Advised may receive this vaccine at local pharmacy or Health Dept.or vaccine clinic. Aware to provide a copy of the vaccination record if obtained from local pharmacy or Health Dept. Verbalized acceptance and understanding.  Qualifies for Shingles Vaccine? Yes   Zostavax completed Yes   Shingrix Completed?: No.    Education has been provided regarding the importance of this vaccine. Patient has been advised to call insurance company to determine out of pocket expense if they have not yet received this vaccine. Advised may also receive vaccine at local pharmacy or Health Dept. Verbalized acceptance and understanding.  Screening Tests Health Maintenance  Topic Date Due  . COVID-19 Vaccine (1) 07/31/2020 (Originally 09/21/1953)  . INFLUENZA VACCINE  01/29/2021 (Originally 06/01/2020)  . PNA vac Low Risk Adult (2 of 2 - PPSV23) 07/15/2021 (Originally 01/03/2016)  . TETANUS/TDAP  07/07/2025  . DEXA SCAN  Completed    Health Maintenance  There are no preventive care reminders to display for this patient.  Colorectal cancer screening: No longer required.    Mammogram status: Completed 11/17/17. Repeat every year   Bone Density status: Completed 01/03/15. Results reflect: Bone density results: OSTEOPENIA. Repeat every 2 years.  Lung Cancer Screening: (Low Dose CT Chest recommended if Age 54-80 years, 30 pack-year currently smoking OR have quit w/in 15years.) does not qualify.   Additional Screening:  Hepatitis C Screening: no longer required  Vision Screening: Recommended annual ophthalmology exams for early detection of glaucoma and other disorders of the eye. Is the patient up to date with their annual eye exam?  Yes  Who is the provider or what is the name of the office in which the patient attends annual eye exams? Dr. Gloriann Loan.   Dental Screening: Recommended annual dental exams for proper oral hygiene  Community Resource Referral / Chronic Care Management: CRR required this visit?   No   CCM required this visit?  No      Plan:     I have personally reviewed and noted the following in the patient's chart:   . Medical and social history . Use of alcohol, tobacco or illicit drugs  . Current medications and supplements . Functional ability and status . Nutritional status . Physical activity . Advanced directives . List of other physicians . Hospitalizations, surgeries, and ER visits in previous 12 months . Vitals . Screenings to include cognitive, depression, and falls . Referrals and appointments  In addition, I have reviewed and discussed with patient certain preventive protocols, quality metrics, and best practice recommendations. A written personalized care plan for preventive services as well as general preventive health recommendations were provided to patient.     Clemetine Marker, LPN   [id number]   Nurse Notes: pt had same day appt with Dr. Ancil Boozer. Pt concerned about health of family at this time but no c/o of her own.

## 2020-07-16 LAB — CBC WITH DIFFERENTIAL/PLATELET
Absolute Monocytes: 721 cells/uL (ref 200–950)
Basophils Absolute: 80 cells/uL (ref 0–200)
Basophils Relative: 0.9 %
Eosinophils Absolute: 134 cells/uL (ref 15–500)
Eosinophils Relative: 1.5 %
HCT: 41.7 % (ref 35.0–45.0)
Hemoglobin: 14.1 g/dL (ref 11.7–15.5)
Lymphs Abs: 3293 cells/uL (ref 850–3900)
MCH: 33.3 pg — ABNORMAL HIGH (ref 27.0–33.0)
MCHC: 33.8 g/dL (ref 32.0–36.0)
MCV: 98.3 fL (ref 80.0–100.0)
MPV: 13 fL — ABNORMAL HIGH (ref 7.5–12.5)
Monocytes Relative: 8.1 %
Neutro Abs: 4673 cells/uL (ref 1500–7800)
Neutrophils Relative %: 52.5 %
Platelets: 156 10*3/uL (ref 140–400)
RBC: 4.24 10*6/uL (ref 3.80–5.10)
RDW: 12.7 % (ref 11.0–15.0)
Total Lymphocyte: 37 %
WBC: 8.9 10*3/uL (ref 3.8–10.8)

## 2020-07-16 LAB — HEMOGLOBIN A1C
Hgb A1c MFr Bld: 5.7 % of total Hgb — ABNORMAL HIGH (ref <5.7)
Mean Plasma Glucose: 117 (calc)
eAG (mmol/L): 6.5 (calc)

## 2020-07-16 LAB — COMPLETE METABOLIC PANEL WITH GFR
AG Ratio: 1.9 (calc) (ref 1.0–2.5)
ALT: 30 U/L — ABNORMAL HIGH (ref 6–29)
AST: 40 U/L — ABNORMAL HIGH (ref 10–35)
Albumin: 4.4 g/dL (ref 3.6–5.1)
Alkaline phosphatase (APISO): 105 U/L (ref 37–153)
BUN/Creatinine Ratio: 7 (calc) (ref 6–22)
BUN: 6 mg/dL — ABNORMAL LOW (ref 7–25)
CO2: 26 mmol/L (ref 20–32)
Calcium: 9.8 mg/dL (ref 8.6–10.4)
Chloride: 104 mmol/L (ref 98–110)
Creat: 0.88 mg/dL (ref 0.60–0.88)
GFR, Est African American: 72 mL/min/{1.73_m2} (ref >=60)
GFR, Est Non African American: 62 mL/min/{1.73_m2} (ref >=60)
Globulin: 2.3 g/dL (calc) (ref 1.9–3.7)
Glucose, Bld: 108 mg/dL — ABNORMAL HIGH (ref 65–99)
Potassium: 4.8 mmol/L (ref 3.5–5.3)
Sodium: 140 mmol/L (ref 135–146)
Total Bilirubin: 0.6 mg/dL (ref 0.2–1.2)
Total Protein: 6.7 g/dL (ref 6.1–8.1)

## 2020-07-16 LAB — LIPID PANEL
Cholesterol: 193 mg/dL (ref <200)
HDL: 49 mg/dL — ABNORMAL LOW (ref >=50)
LDL Cholesterol (Calc): 115 mg/dL (calc) — ABNORMAL HIGH
Non-HDL Cholesterol (Calc): 144 mg/dL (calc) — ABNORMAL HIGH (ref <130)
Total CHOL/HDL Ratio: 3.9 (calc) (ref <5.0)
Triglycerides: 174 mg/dL — ABNORMAL HIGH (ref <150)

## 2020-07-29 DIAGNOSIS — H40153 Residual stage of open-angle glaucoma, bilateral: Secondary | ICD-10-CM | POA: Diagnosis not present

## 2020-11-13 DIAGNOSIS — H40153 Residual stage of open-angle glaucoma, bilateral: Secondary | ICD-10-CM | POA: Diagnosis not present
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
## 2021-01-07 ENCOUNTER — Ambulatory Visit (INDEPENDENT_AMBULATORY_CARE_PROVIDER_SITE_OTHER): Payer: PPO | Admitting: Family Medicine

## 2021-01-07 ENCOUNTER — Encounter: Payer: Self-pay | Admitting: Family Medicine

## 2021-01-07 ENCOUNTER — Other Ambulatory Visit: Payer: Self-pay

## 2021-01-07 VITALS — BP 140/80 | HR 108 | Temp 98.6°F | Resp 16 | Ht 64.0 in | Wt 179.0 lb

## 2021-01-07 DIAGNOSIS — R000 Tachycardia, unspecified: Secondary | ICD-10-CM | POA: Diagnosis not present

## 2021-01-07 DIAGNOSIS — M810 Age-related osteoporosis without current pathological fracture: Secondary | ICD-10-CM | POA: Diagnosis not present

## 2021-01-07 DIAGNOSIS — I10 Essential (primary) hypertension: Secondary | ICD-10-CM

## 2021-01-07 DIAGNOSIS — R600 Localized edema: Secondary | ICD-10-CM

## 2021-01-07 DIAGNOSIS — E8881 Metabolic syndrome: Secondary | ICD-10-CM

## 2021-01-07 DIAGNOSIS — F330 Major depressive disorder, recurrent, mild: Secondary | ICD-10-CM | POA: Diagnosis not present

## 2021-01-07 DIAGNOSIS — F5101 Primary insomnia: Secondary | ICD-10-CM | POA: Diagnosis not present

## 2021-01-07 DIAGNOSIS — R739 Hyperglycemia, unspecified: Secondary | ICD-10-CM | POA: Diagnosis not present

## 2021-01-07 DIAGNOSIS — E876 Hypokalemia: Secondary | ICD-10-CM

## 2021-01-07 DIAGNOSIS — E559 Vitamin D deficiency, unspecified: Secondary | ICD-10-CM

## 2021-01-07 DIAGNOSIS — E785 Hyperlipidemia, unspecified: Secondary | ICD-10-CM | POA: Diagnosis not present

## 2021-01-07 DIAGNOSIS — D692 Other nonthrombocytopenic purpura: Secondary | ICD-10-CM

## 2021-01-07 MED ORDER — SIMVASTATIN 40 MG PO TABS: 40.0000 mg | ORAL_TABLET | Freq: Every day | ORAL | 1 refills | Status: DC

## 2021-01-07 MED ORDER — METOPROLOL SUCCINATE ER 25 MG PO TB24: 25.0000 mg | ORAL_TABLET | Freq: Every day | ORAL | 1 refills | Status: DC

## 2021-01-07 MED ORDER — POTASSIUM CHLORIDE CRYS ER 20 MEQ PO TBCR: 20.0000 meq | EXTENDED_RELEASE_TABLET | Freq: Four times a day (QID) | ORAL | 1 refills | Status: DC

## 2021-01-28 ENCOUNTER — Ambulatory Visit: Payer: Self-pay | Admitting: *Deleted

## 2021-01-28 NOTE — Telephone Encounter (Signed)
   Patient requesting spironolactone 100 mg for increasing abdominal swelling. Patient reports abdominal swelling has been increasing for a while and PCP is aware. Patient denied pain, shortness of breath or any other c/o other than abdomen is now swollen and she "looks pregnant". Reports blood pressure is a little higher than it has been. Patient unable to give a specific reading for B/P. Patient would like PCP to write Rx for spironolactone 100 mg and send to [facility] pharmacy as previously discussed with PCP in last visit. Care advise given. Patient verbalized understanding of care advise and to call back or go to ED if symptoms worsen. Please advise.   Reason for Disposition . Prescription request for new medicine (not a refill)  Answer Assessment - Initial Assessment Questions 1. NAME of MEDICATION: "What medicine are you calling about?"     spironolactone 100 mg  2. QUESTION: "What is your question?" (e.g., medication refill, side effect)     Would like to request a renewal of this medication due to abdominal swelling  3. PRESCRIBING HCP: "Who prescribed it?" Reason: if prescribed by specialist, call should be referred to that group.     PCP 4. SYMPTOMS: "Do you have any symptoms?"     Yes abdominal swelling  5. SEVERITY: If symptoms are present, ask "Are they mild, moderate or severe?"     Severe. Patient reports she looks "pregnant" 6. PREGNANCY:  "Is there any chance that you are pregnant?" "When was your last menstrual period?"     no  Protocols used: MEDICATION QUESTION CALL-A-AH

## 2021-02-02 NOTE — Progress Notes (Signed)
Name: Leslie Patel   MRN: [id number]    DOB: 27-Apr-1941   Date:02/03/2021       Progress Note  Subjective  Chief Complaint  Discuss Medication  HPI  HTN/Tachycardia: we started on Metoprolol one month ago but she only took for one day and stopped it on her own. She has a history of hypokalemia and HTN, she was started on 10 tablets of potassium 20 meq daily back in 2006, we gradually stopped lasix and later on spironalctone due to no edema and normal bp, patient is requesting to resume Aldactone because she feels her abdomen is distended due to ascites. Her weight back in 12/2018 was 167 lbs and she states she has gained weight as soon as she stopped diuretic. She states she does not eat much but continues to gain weight.   Patient Active Problem List   Diagnosis Date Noted  . Mild recurrent major depression ([place]) 07/07/2018  . History of breast cancer 10/22/2015  . Senile purpura ([place]) 07/08/2015  . History of dental problems 07/08/2015  . Benign essential HTN 07/04/2015  . Dyslipidemia 07/04/2015  . Personal history of malignant neoplasm of breast 07/04/2015  . H/O: rheumatic fever 07/04/2015  . Insomnia 07/04/2015  . Dysmetabolic syndrome 78/29/5621  . OP (osteoporosis) 07/04/2015  . Allergic rhinitis, seasonal 07/04/2015  . Vitamin D deficiency 07/04/2015  . Tobacco use 07/04/2015    Past Surgical History:  Procedure Laterality Date  . ABDOMINAL HYSTERECTOMY    . BREAST BIOPSY Left 2006   invasive lobular carcinoma  . BREAST BIOPSY Right 10/22/2014   Fibrocystic changes, pseudo-angiomatous stromal hyperplasia.  Marland Kitchen BREAST EXCISIONAL BIOPSY Right 1991   benign  . BREAST LUMPECTOMY Left 09/2005   invasive lobular carcinoma clear margins  . BREAST SURGERY Left 2006   lumpectomy  . CATARACT EXTRACTION, BILATERAL     Left-07/2014 Right-06-25-14  . fracture left arm     Repair  . PORT-A-CATH REMOVAL  11/2007  . PORTACATH PLACEMENT  2006    Family History  Problem  Relation Age of Onset  . Tuberculosis Mother   . Cancer Father        leukemia  . Cancer Daughter        Breast  . Diabetes Daughter   . Rheum arthritis Daughter   . Obesity Daughter   . Breast cancer Daughter 67  . Throat cancer Daughter   . Other Brother        spinal meningitis  . Kidney failure Granddaughter     Social History   Tobacco Use  . Smoking status: Former Smoker    Packs/day: 0.30    Years: 30.00    Pack years: 9.00    Types: Cigarettes  . Smokeless tobacco: Never Used  . Tobacco comment: patient said that she has not touched a cigarette in a couple of months  Substance Use Topics  . Alcohol use: No    Alcohol/week: 0.0 standard drinks     Current Outpatient Medications:  .  Calcium Carb-Cholecalciferol 600-100 MG-UNIT CAPS, Take 1 tablet by mouth daily., Disp: , Rfl:  .  fluticasone (FLONASE) 50 MCG/ACT nasal spray, Place 2 sprays into both nostrils daily., Disp: 16 g, Rfl: 6 .  latanoprost (XALATAN) 0.005 % ophthalmic solution, Place 1 drop into both eyes at bedtime., Disp: , Rfl:  .  potassium chloride SA (KLOR-CON) 20 MEQ tablet, Take 1 tablet (20 mEq total) by mouth 4 (four) times daily., Disp: 360 tablet, Rfl: 1 .  simvastatin (ZOCOR) 40 MG tablet, Take 1 tablet (40 mg total) by mouth daily., Disp: 90 tablet, Rfl: 1  Allergies  Allergen Reactions  . Darvon [Propoxyphene] Anaphylaxis  . Loratadine Anaphylaxis  . Cephalosporins   . Codeine   . Eggs Or Egg-Derived Products   . Paroxetine   . Penicillins   . Sulfa Antibiotics   . Xanax  [Alprazolam]   . Percocet [Oxycodone-Acetaminophen] Itching, Rash and Other (See Comments)    wheezing    I personally reviewed active problem list, medication list, allergies, family history, social history with the patient/caregiver today.   ROS  Ten systems reviewed and is negative except as mentioned in HPI   Objective  Vitals:   02/03/21 1317  BP: 136/78  Pulse: 87  Resp: 16  Temp: 98 F (36.7  C)  TempSrc: Oral  SpO2: 97%  Weight: 182 lb (82.6 kg)  Height: 5\' 4"  (1.626 m)    Body mass index is 31.24 kg/m.  Physical Exam  Constitutional: Patient appears well-developed and well-nourished. Obese  No distress.  HEENT: head atraumatic, normocephalic, pupils equal and reactive to light, neck supple Cardiovascular: Normal rate, regular rhythm and normal heart sounds.  No murmur heard. No BLE edema. Pulmonary/Chest: Effort normal and breath sounds normal. No respiratory distress. Abdominal: Soft.  There is no tenderness., normal bowel sounds, did not feel any fluids in abdomen  Psychiatric: Patient has a normal mood and affect. behavior is normal. Judgment and thought content normal.  PHQ2/9: Depression screen [facility] 2/9 02/03/2021 01/07/2021 07/15/2020 07/15/2020 01/08/2020  Decreased Interest 3 2 0 0 3  Down, Depressed, Hopeless 2 2 0 0 2  PHQ - 2 Score 5 4 0 0 5  Altered sleeping 1 1 - 0 2  Tired, decreased energy 1 1 - 0 1  Change in appetite 1 1 - 0 0  Feeling bad or failure about yourself  1 0 - 0 1  Trouble concentrating 0 0 - 0 0  Moving slowly or fidgety/restless 0 0 - 0 0  Suicidal thoughts 0 0 - 0 0  PHQ-9 Score 9 7 - 0 9  Difficult doing work/chores - - - - Somewhat difficult  Some recent data might be hidden    phq 9 is positive   Fall Risk: Fall Risk  02/03/2021 01/07/2021 07/15/2020 07/15/2020 01/08/2020  Falls in the past year? 0 0 0 0 0  Number falls in past yr: 0 0 0 0 0  Injury with Fall? 0 0 0 0 0  Comment - - - - -  Risk for fall due to : - - No Fall Risks - -  Risk for fall due to: Comment - - - - -  Follow up - - Falls prevention discussed - -     Functional Status Survey: Is the patient deaf or have difficulty hearing?: No Does the patient have difficulty seeing, even when wearing glasses/contacts?: No Does the patient have difficulty concentrating, remembering, or making decisions?: No Does the patient have difficulty walking or climbing stairs?:  No Does the patient have difficulty dressing or bathing?: No Does the patient have difficulty doing errands alone such as visiting a doctor's office or shopping?: No    Assessment & Plan  1. Tachycardia  - Ambulatory referral to Nephrology, heart rate is back to normal, she stopped metoprolol on her own  To discuss resuming aldactone, also evaluation of hypokalemia, still taking 10 meq of potassium four times daily   2. History  of hypertension  - Ambulatory referral to Nephrology   3. Distended abdomen  - US Abdomen Complete; Future  4. Elevated liver function tests  - US Abdomen Complete; Future

## 2021-02-03 ENCOUNTER — Other Ambulatory Visit: Payer: Self-pay

## 2021-02-03 ENCOUNTER — Ambulatory Visit (INDEPENDENT_AMBULATORY_CARE_PROVIDER_SITE_OTHER): Payer: PPO | Admitting: Family Medicine

## 2021-02-03 ENCOUNTER — Encounter: Payer: Self-pay | Admitting: Family Medicine

## 2021-02-03 VITALS — BP 136/78 | HR 87 | Temp 98.0°F | Resp 16 | Ht 64.0 in | Wt 182.0 lb

## 2021-02-03 DIAGNOSIS — R140 Abdominal distension (gaseous): Secondary | ICD-10-CM

## 2021-02-03 DIAGNOSIS — R000 Tachycardia, unspecified: Secondary | ICD-10-CM | POA: Diagnosis not present

## 2021-02-03 DIAGNOSIS — Z8679 Personal history of other diseases of the circulatory system: Secondary | ICD-10-CM | POA: Diagnosis not present

## 2021-02-03 DIAGNOSIS — R7989 Other specified abnormal findings of blood chemistry: Secondary | ICD-10-CM | POA: Diagnosis not present

## 2021-03-11 ENCOUNTER — Other Ambulatory Visit: Payer: Self-pay

## 2021-03-11 ENCOUNTER — Ambulatory Visit
Admission: RE | Admit: 2021-03-11 | Discharge: 2021-03-11 | Disposition: A | Discharge status: Home / Self Care | Payer: PPO | Source: Ambulatory Visit | Attending: Family Medicine | Admitting: Family Medicine

## 2021-03-11 DIAGNOSIS — R7989 Other specified abnormal findings of blood chemistry: Secondary | ICD-10-CM | POA: Diagnosis not present

## 2021-03-11 DIAGNOSIS — K7689 Other specified diseases of liver: Secondary | ICD-10-CM | POA: Diagnosis not present

## 2021-03-11 DIAGNOSIS — K760 Fatty (change of) liver, not elsewhere classified: Secondary | ICD-10-CM | POA: Diagnosis not present

## 2021-03-11 DIAGNOSIS — R748 Abnormal levels of other serum enzymes: Secondary | ICD-10-CM | POA: Diagnosis not present

## 2021-03-11 DIAGNOSIS — R140 Abdominal distension (gaseous): Secondary | ICD-10-CM | POA: Insufficient documentation

## 2021-03-11 DIAGNOSIS — I700 Atherosclerosis of aorta: Secondary | ICD-10-CM | POA: Diagnosis not present

## 2021-03-13 ENCOUNTER — Encounter: Payer: Self-pay | Admitting: Family Medicine

## 2021-03-13 DIAGNOSIS — K760 Fatty (change of) liver, not elsewhere classified: Secondary | ICD-10-CM | POA: Insufficient documentation

## 2021-03-13 DIAGNOSIS — I700 Atherosclerosis of aorta: Secondary | ICD-10-CM | POA: Insufficient documentation

## 2021-03-16 ENCOUNTER — Telehealth: Payer: Self-pay

## 2021-03-16 NOTE — Telephone Encounter (Signed)
Copied from [place] [id number]. Topic: General - Other >> Mar 16, 2021  3:12 PM Yvette Rack wrote: Reason for CRM: Pt called for an update on the ultrasound results. Pt requests call back

## 2021-03-19 DIAGNOSIS — H40153 Residual stage of open-angle glaucoma, bilateral: Secondary | ICD-10-CM | POA: Diagnosis not present

## 2021-04-06 ENCOUNTER — Other Ambulatory Visit: Payer: Self-pay | Admitting: Family Medicine

## 2021-04-06 DIAGNOSIS — I10 Essential (primary) hypertension: Secondary | ICD-10-CM

## 2021-04-06 DIAGNOSIS — E876 Hypokalemia: Secondary | ICD-10-CM

## 2021-04-06 DIAGNOSIS — R000 Tachycardia, unspecified: Secondary | ICD-10-CM

## 2021-06-29 ENCOUNTER — Encounter: Payer: Self-pay | Admitting: Family Medicine

## 2021-07-02 ENCOUNTER — Other Ambulatory Visit: Payer: Self-pay | Admitting: Family Medicine

## 2021-07-02 DIAGNOSIS — E876 Hypokalemia: Secondary | ICD-10-CM

## 2021-07-02 DIAGNOSIS — I10 Essential (primary) hypertension: Secondary | ICD-10-CM

## 2021-07-02 DIAGNOSIS — R000 Tachycardia, unspecified: Secondary | ICD-10-CM

## 2021-07-13 ENCOUNTER — Ambulatory Visit: Payer: PPO | Admitting: Family Medicine

## 2021-07-16 ENCOUNTER — Ambulatory Visit: Payer: PPO

## 2021-07-23 DIAGNOSIS — H40153 Residual stage of open-angle glaucoma, bilateral: Secondary | ICD-10-CM | POA: Diagnosis not present

## 2021-08-10 ENCOUNTER — Ambulatory Visit: Payer: PPO | Admitting: Adult Health

## 2021-08-17 ENCOUNTER — Ambulatory Visit: Payer: PPO | Admitting: Adult Health

## 2021-10-22 DIAGNOSIS — E876 Hypokalemia: Secondary | ICD-10-CM | POA: Diagnosis not present

## 2021-10-22 DIAGNOSIS — I10 Essential (primary) hypertension: Secondary | ICD-10-CM | POA: Diagnosis not present

## 2021-10-22 DIAGNOSIS — E782 Mixed hyperlipidemia: Secondary | ICD-10-CM | POA: Diagnosis not present

## 2021-11-06 DIAGNOSIS — I10 Essential (primary) hypertension: Secondary | ICD-10-CM | POA: Diagnosis not present

## 2021-11-11 DIAGNOSIS — H40153 Residual stage of open-angle glaucoma, bilateral: Secondary | ICD-10-CM | POA: Diagnosis not present

## 2021-11-13 DIAGNOSIS — Z1231 Encounter for screening mammogram for malignant neoplasm of breast: Secondary | ICD-10-CM | POA: Diagnosis not present

## 2021-11-13 DIAGNOSIS — Z0000 Encounter for general adult medical examination without abnormal findings: Secondary | ICD-10-CM | POA: Diagnosis not present

## 2021-11-13 DIAGNOSIS — E782 Mixed hyperlipidemia: Secondary | ICD-10-CM | POA: Diagnosis not present

## 2021-11-13 DIAGNOSIS — I10 Essential (primary) hypertension: Secondary | ICD-10-CM | POA: Diagnosis not present

## 2021-11-13 DIAGNOSIS — Z853 Personal history of malignant neoplasm of breast: Secondary | ICD-10-CM | POA: Diagnosis not present

## 2021-12-25 ENCOUNTER — Other Ambulatory Visit: Payer: Self-pay | Admitting: Family Medicine

## 2021-12-25 DIAGNOSIS — E785 Hyperlipidemia, unspecified: Secondary | ICD-10-CM

## 2021-12-28 NOTE — Telephone Encounter (Signed)
Spoke with patient and informed her that the rx for simvastatin has be sent to her pharmacy on today by Dr. Ancil Boozer. I did inform patient that she needed to schedule a follow up appointment and patient stated that she was going to be transferring her care to another office.  I informed patient that I would go ahead and remove Dr. Ancil Boozer as her PCP.  Patient verbalized understanding.

## 2022-03-09 DIAGNOSIS — H40153 Residual stage of open-angle glaucoma, bilateral: Secondary | ICD-10-CM | POA: Diagnosis not present

## 2022-03-24 ENCOUNTER — Other Ambulatory Visit: Payer: Self-pay | Admitting: Family Medicine

## 2022-03-24 DIAGNOSIS — E876 Hypokalemia: Secondary | ICD-10-CM

## 2022-07-13 DIAGNOSIS — H40153 Residual stage of open-angle glaucoma, bilateral: Secondary | ICD-10-CM | POA: Diagnosis not present

## 2022-09-24 ENCOUNTER — Ambulatory Visit
Admission: EM | Admit: 2022-09-24 | Discharge: 2022-09-24 | Disposition: A | Discharge status: Home / Self Care | Payer: PPO | Attending: Urgent Care | Admitting: Urgent Care

## 2022-09-24 DIAGNOSIS — R399 Unspecified symptoms and signs involving the genitourinary system: Secondary | ICD-10-CM | POA: Diagnosis not present

## 2022-09-24 DIAGNOSIS — N3001 Acute cystitis with hematuria: Secondary | ICD-10-CM | POA: Diagnosis not present

## 2022-09-24 LAB — POCT URINALYSIS DIP (MANUAL ENTRY)
Bilirubin, UA: NEGATIVE
Glucose, UA: NEGATIVE mg/dL
Nitrite, UA: POSITIVE — AB
Protein Ur, POC: 30 mg/dL — AB
Spec Grav, UA: 1.025 (ref 1.010–1.025)
Urobilinogen, UA: 0.2 E.U./dL
pH, UA: 6 (ref 5.0–8.0)

## 2022-09-24 MED ORDER — CIPROFLOXACIN HCL 500 MG PO TABS: 500.0000 mg | ORAL_TABLET | Freq: Two times a day (BID) | ORAL | 0 refills | Status: AC

## 2022-09-24 NOTE — Discharge Instructions (Signed)
Follow up here or with your primary care provider if your symptoms are worsening or not improving with treatment.     

## 2022-09-24 NOTE — ED Triage Notes (Signed)
Pt. Presents to UC stating she was informed by the "cops" that she should get tested for a UTI. Pt. Endorses no symptoms.

## 2022-09-24 NOTE — ED Provider Notes (Addendum)
Leslie Patel    CSN: [id number] Arrival date & time: 09/24/22  1710      History   Chief Complaint No chief complaint on file.   HPI Leslie Patel is a 81 y.o. female.   HPI  Presents to UC to request evaluation of possible UTI. She endorses no symptoms.  Patient states she was in contact with law enforcement because her husband took away 2 children that she was assigned to care for by the court.  She states that the police officer told her she "might be hallucinating" and suggested she be seen by a doctor to check for a UTI.  Past Medical History:  Diagnosis Date   Allergy    Bilateral cataracts    Breast cancer ([place]) 09/16/2005   Left breast, 2.1 cm histologic grade 2, pT2,N1 (mic),M0. ER 90%, PR 70%, HER-2/neu not amplified.   Cancer [facility]) 2006   left breast with radiation and chemo   Dyslipidemia    Edema    Head injury 1995   History of breast cancer    History of chemotherapy 2006   History of radiation therapy 2006   Hx of rheumatic fever    Hypertension    Hypokalemia    Insomnia    Low blood potassium    Metabolic syndrome    Osteoporosis    Personal history of chemotherapy    Personal history of radiation therapy    Shingles 2006   Stress at home    Symptomatic menopausal or female climacteric states    Vitamin D deficiency     Patient Active Problem List   Diagnosis Date Noted   Atherosclerosis of aorta ([place]) 03/13/2021   Fatty liver 03/13/2021   Mild recurrent major depression ([place]) 07/07/2018   History of breast cancer 10/22/2015   Senile purpura ([place]) 07/08/2015   History of dental problems 07/08/2015   Benign essential HTN 07/04/2015   Dyslipidemia 07/04/2015   Personal history of malignant neoplasm of breast 07/04/2015   H/O: rheumatic fever 07/04/2015   Insomnia 19/50/9326   Dysmetabolic syndrome 71/24/5809   OP (osteoporosis) 07/04/2015   Allergic rhinitis, seasonal 07/04/2015   Vitamin D deficiency 07/04/2015    Tobacco use 07/04/2015    Past Surgical History:  Procedure Laterality Date   ABDOMINAL HYSTERECTOMY     BREAST BIOPSY Left 2006   invasive lobular carcinoma   BREAST BIOPSY Right 10/22/2014   Fibrocystic changes, pseudo-angiomatous stromal hyperplasia.   BREAST EXCISIONAL BIOPSY Right 1991   benign   BREAST LUMPECTOMY Left 09/2005   invasive lobular carcinoma clear margins   BREAST SURGERY Left 2006   lumpectomy   CATARACT EXTRACTION, BILATERAL     Left-07/2014 Right-06-25-14   fracture left arm     Repair   PORT-A-CATH REMOVAL  11/2007   PORTACATH PLACEMENT  2006    OB History     Gravida  2   Para  2   Term      Preterm      AB      Living  2      SAB      IAB      Ectopic      Multiple      Live Births           Obstetric Comments  1st Menstrual Cycle:  11 1st Pregnancy:  19           Home Medications    Prior to Admission medications  Medication Sig Start Date End Date Taking? Authorizing Provider  Calcium Carb-Cholecalciferol 600-100 MG-UNIT CAPS Take 1 tablet by mouth daily. 11/13/07   [provider]  fluticasone (FLONASE) 50 MCG/ACT nasal spray Place 2 sprays into both nostrils daily. 11/24/18   Poulose, Bethel Born, NP  latanoprost (XALATAN) 0.005 % ophthalmic solution Place 1 drop into both eyes at bedtime.    [provider]  potassium chloride SA (KLOR-CON) 20 MEQ tablet TAKE 1 TABLET BY MOUTH FOUR TIMES DAILY. 07/02/21   Steele Sizer, MD  simvastatin (ZOCOR) 40 MG tablet TAKE 1 TABLET BY MOUTH DAILY 12/28/21   Steele Sizer, MD    Family History Family History  Problem Relation Age of Onset   Tuberculosis Mother    Cancer Father        leukemia   Cancer Daughter        Breast   Diabetes Daughter    Rheum arthritis Daughter    Obesity Daughter    Breast cancer Daughter 77   Throat cancer Daughter    Other Brother        spinal meningitis   Kidney failure Granddaughter     Social History Social  History   Tobacco Use   Smoking status: Former    Packs/day: 0.30    Years: 30.00    Total pack years: 9.00    Types: Cigarettes   Smokeless tobacco: Never   Tobacco comments:    patient said that she has not touched a cigarette in a couple of months  Vaping Use   Vaping Use: Never used  Substance Use Topics   Alcohol use: No    Alcohol/week: 0.0 standard drinks of alcohol   Drug use: No     Allergies   Darvon [propoxyphene], Loratadine, Cephalosporins, Codeine, Eggs or egg-derived products, Paroxetine, Penicillins, Sulfa antibiotics, Xanax  [alprazolam], and Percocet [oxycodone-acetaminophen]   Review of Systems Review of Systems   Physical Exam Triage Vital Signs ED Triage Vitals  Enc Vitals Group     BP      Pulse      Resp      Temp      Temp src      SpO2      Weight      Height      Head Circumference      Peak Flow      Pain Score      Pain Loc      Pain Edu?      Excl. in [place]?    No data found.  Updated Vital Signs There were no vitals taken for this visit.  Visual Acuity Right Eye Distance:   Left Eye Distance:   Bilateral Distance:    Right Eye Near:   Left Eye Near:    Bilateral Near:     Physical Exam Vitals reviewed.  Constitutional:      Appearance: Normal appearance.  Neurological:     General: No focal deficit present.     Mental Status: She is alert and oriented to person, place, and time.  Psychiatric:        Mood and Affect: Mood normal.        Behavior: Behavior normal.      UC Treatments / Results  Labs (all labs ordered are listed, but only abnormal results are displayed) Labs Reviewed  POCT URINALYSIS DIP (MANUAL ENTRY)    EKG   Radiology No results found.  Procedures Procedures (including critical care time)  Medications Ordered in UC Medications - No data to display  Initial Impression / Assessment and Plan / UC Course  I have reviewed the triage vital signs and the nursing notes.  Pertinent labs  & imaging results that were available during my care of the patient were reviewed by me and considered in my medical decision making (see chart for details).   UA is positive for signs of infection with large leukocytes, positive nitrites, small blood.  Urine is cloudy.  Treating with cipro. Recent GFR = 53.  Her current mental status is unclear as chart indicates she is widowed.  Attempted to contact the patient's primary care provider by telephone but unfortunately the office was closed and does not except messages.  Also attempted to contact local police to request wellness check in the morning however police department does not have full-time staff and does not except messages.  Urine culture is ordered to verify susceptibility.   Final Clinical Impressions(s) / UC Diagnoses   Final diagnoses:  None   Discharge Instructions   None    ED Prescriptions   None    PDMP not reviewed this encounter.   Rose Phi, FNP 09/24/22 1801    Rose Phi, [place] 09/24/22 1832

## 2022-09-25 ENCOUNTER — Telehealth: Payer: Self-pay | Admitting: Urgent Care

## 2022-09-25 NOTE — Telephone Encounter (Signed)
Received callback from [place] who informed me that the patient did not live within city limits and would be served by the [employer].  Contacted dispatch for [employer] and requested wellness check for the patient. Received a call-back from [employer] who informed me that she was unable to make contact with the patient, no answer at her door, however her car appeared to be present and possibly in the home.  Dep [place] also informed me that she was unable to make contact with any of the other known contacts for the patient including her granddaughter. She further confirmed that the [facility] did have interaction with the patient in recent days, confirming the information provided by the patient during her visit yesterday. The officer asked for additional information to inform them whether a forced  Called [facility] who informed me that the patient DID NOT pick up her prescription for antibiotics yesterday.  Reached Ms Nowland's contact Sharol Given (out of town) who informed me that she had an alternative phone number for Ms Acero and would try to reach her. She also informed me that Merry Proud, who owns a home adjacent to [employer] (and owns the shop) has a key for Ms Maxson's home.  Contacted the Sheriff's department and relayed this information.

## 2022-09-26 LAB — URINE CULTURE: Culture: >=100000 — AB

## 2022-11-08 DIAGNOSIS — I10 Essential (primary) hypertension: Secondary | ICD-10-CM | POA: Diagnosis not present

## 2022-11-08 DIAGNOSIS — R7309 Other abnormal glucose: Secondary | ICD-10-CM | POA: Diagnosis not present

## 2022-11-08 DIAGNOSIS — E782 Mixed hyperlipidemia: Secondary | ICD-10-CM | POA: Diagnosis not present

## 2022-11-11 DIAGNOSIS — H40153 Residual stage of open-angle glaucoma, bilateral: Secondary | ICD-10-CM | POA: Diagnosis not present

## 2022-11-15 DIAGNOSIS — R7303 Prediabetes: Secondary | ICD-10-CM | POA: Diagnosis not present

## 2022-11-15 DIAGNOSIS — Z720 Tobacco use: Secondary | ICD-10-CM | POA: Diagnosis not present

## 2022-11-15 DIAGNOSIS — I10 Essential (primary) hypertension: Secondary | ICD-10-CM | POA: Diagnosis not present

## 2022-11-15 DIAGNOSIS — Z1331 Encounter for screening for depression: Secondary | ICD-10-CM | POA: Diagnosis not present

## 2022-11-15 DIAGNOSIS — E876 Hypokalemia: Secondary | ICD-10-CM | POA: Diagnosis not present

## 2022-11-15 DIAGNOSIS — Z853 Personal history of malignant neoplasm of breast: Secondary | ICD-10-CM | POA: Diagnosis not present

## 2022-11-15 DIAGNOSIS — Z0000 Encounter for general adult medical examination without abnormal findings: Secondary | ICD-10-CM | POA: Diagnosis not present

## 2022-11-15 DIAGNOSIS — E782 Mixed hyperlipidemia: Secondary | ICD-10-CM | POA: Diagnosis not present

## 2022-11-15 DIAGNOSIS — Z1231 Encounter for screening mammogram for malignant neoplasm of breast: Secondary | ICD-10-CM | POA: Diagnosis not present

## 2022-11-17 ENCOUNTER — Other Ambulatory Visit: Payer: Self-pay | Admitting: Family Medicine

## 2022-11-17 DIAGNOSIS — Z1231 Encounter for screening mammogram for malignant neoplasm of breast: Secondary | ICD-10-CM

## 2022-11-28 IMAGING — US US ABDOMEN COMPLETE
1 series · 14 of 25 positions shown · non-contrast
Comparison: None.

CLINICAL DATA: Descend abdomen pitted elevated liver enzymes

EXAM:
ABDOMEN ULTRASOUND COMPLETE

[Series 1: us abdomen complete · 0.20mm/px · 14 of 93 slices shown]
[im 1/93]
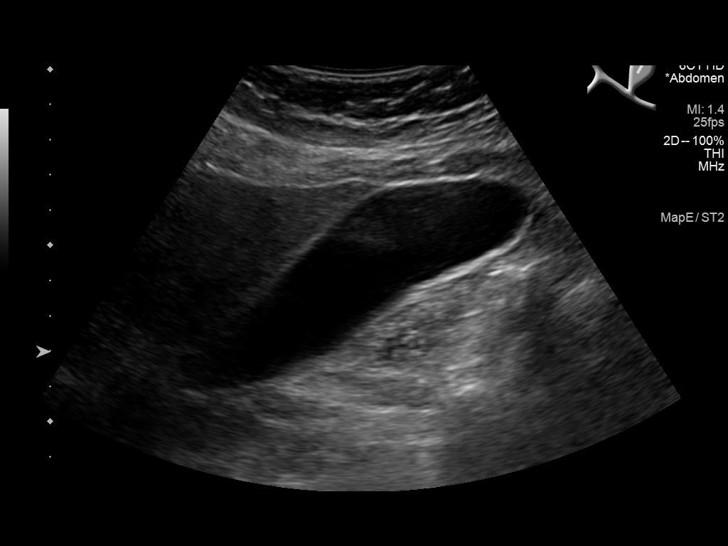
[im 8/93]
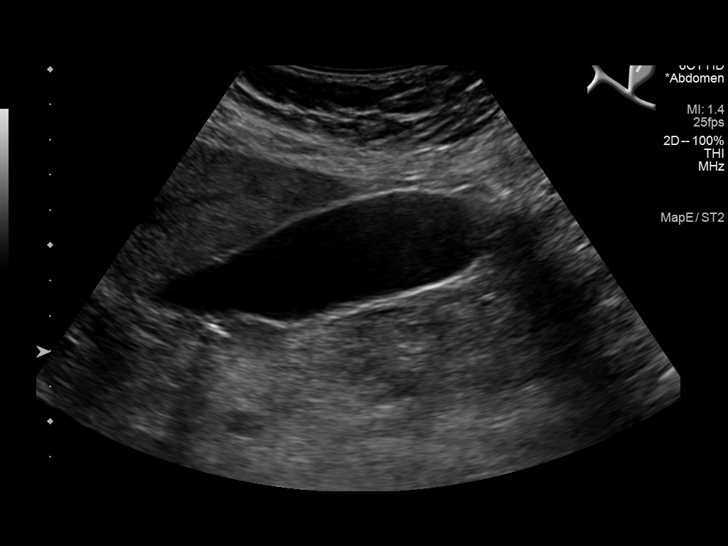
[im 16/93]
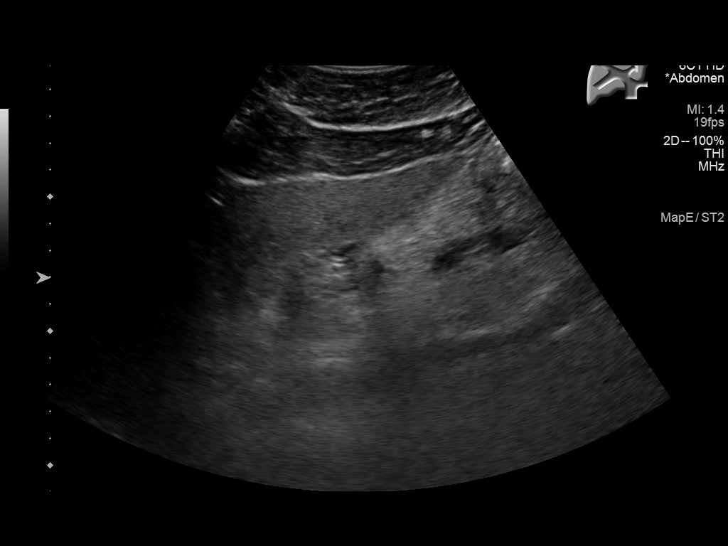
[im 24/93]
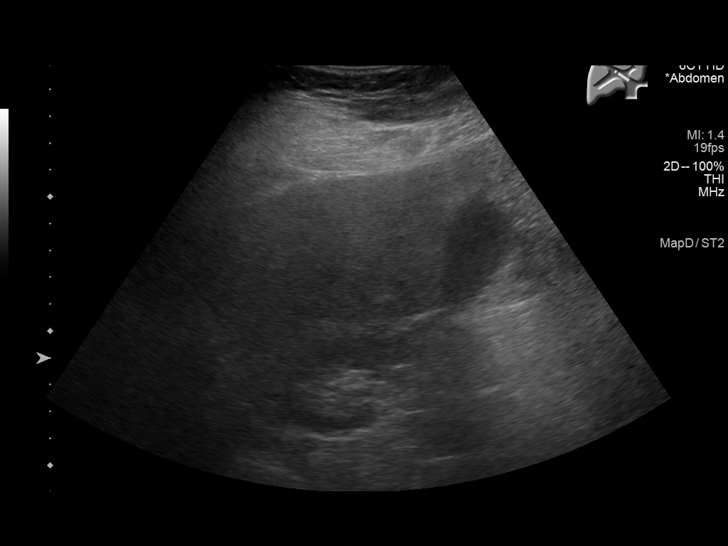
[im 31/93]
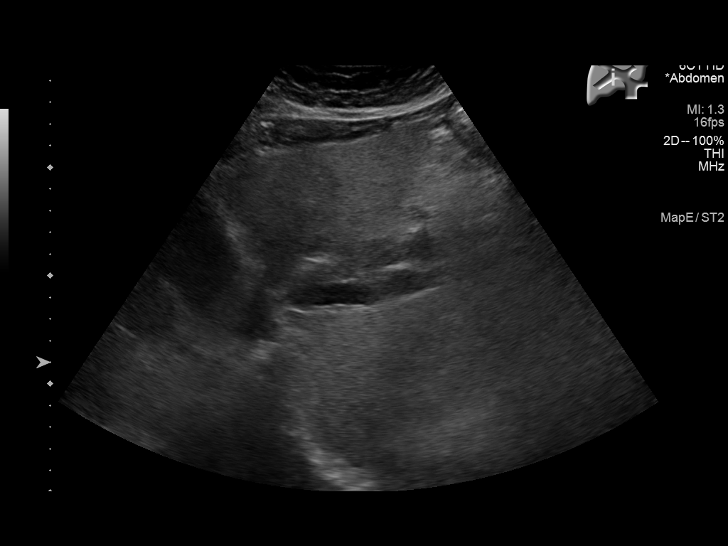
[im 35/93]
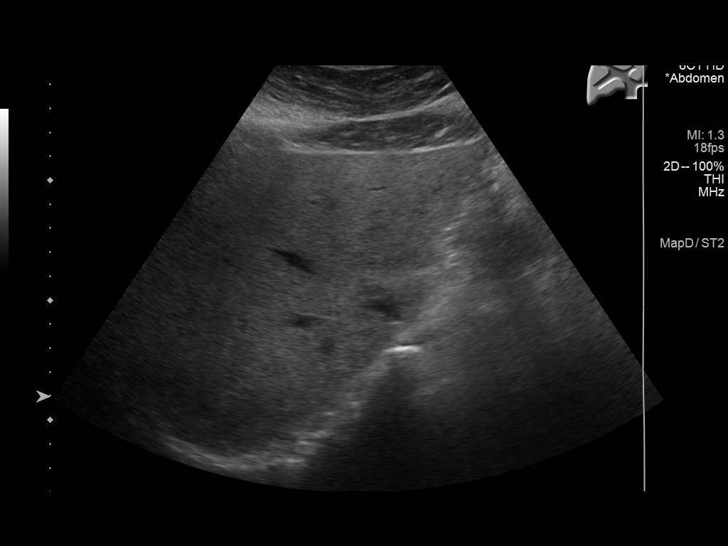
[im 43/93]
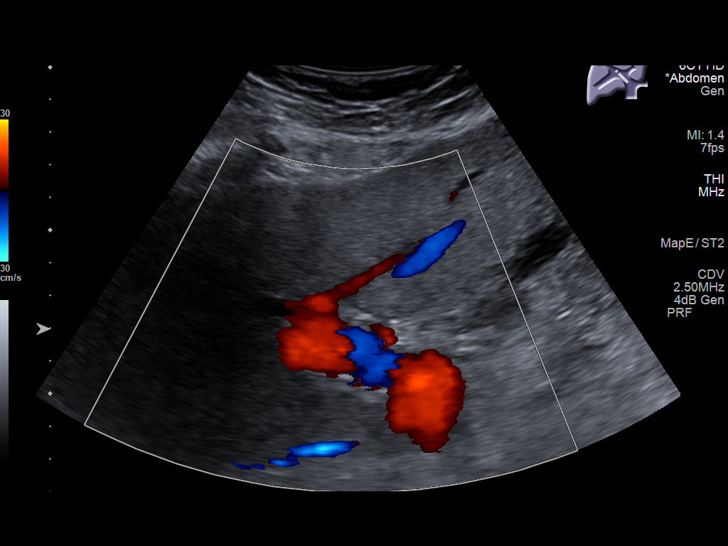
[im 50/93]
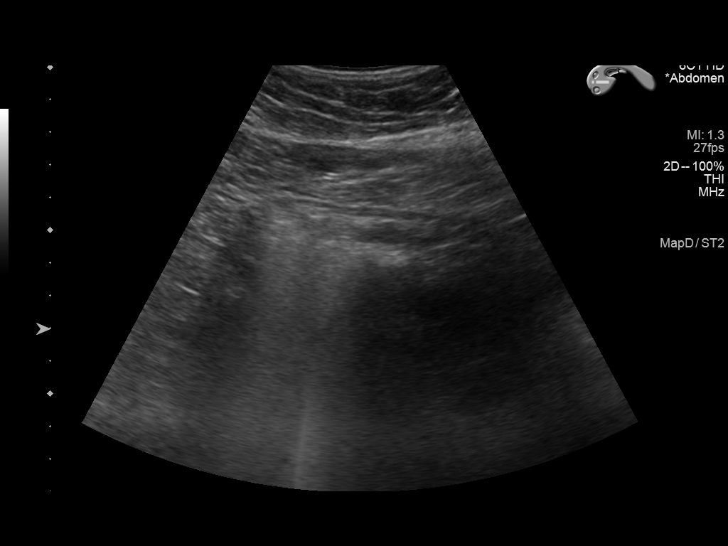
[im 58/93]
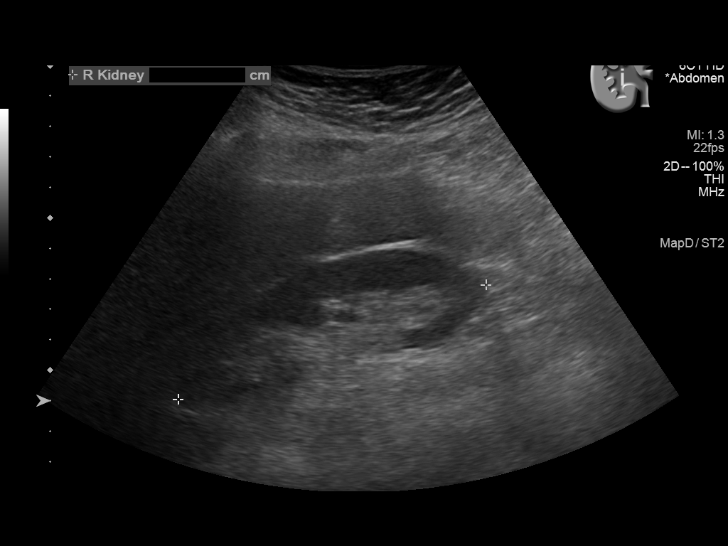
[im 62/93]
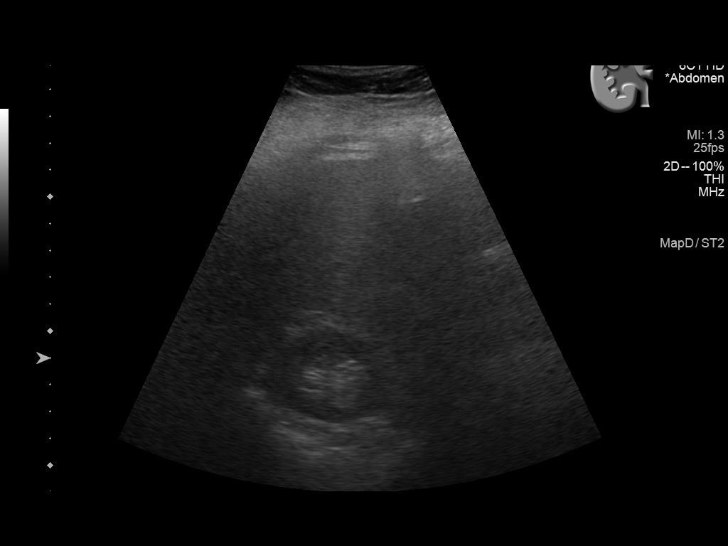
[im 70/93]
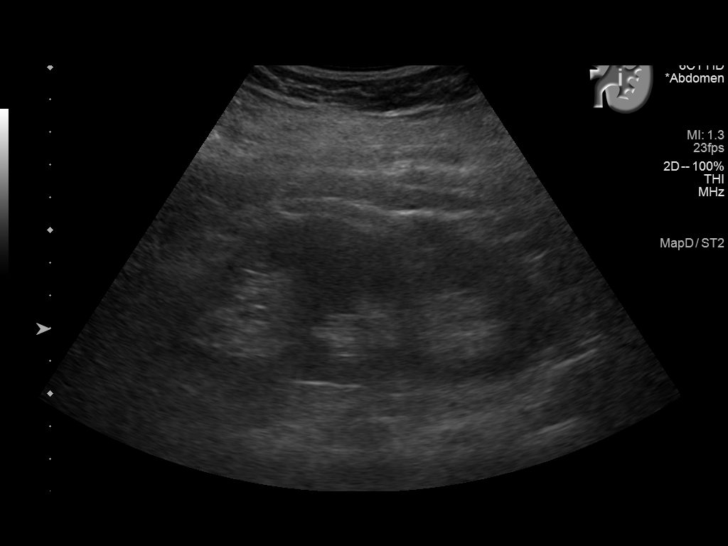
[im 77/93]
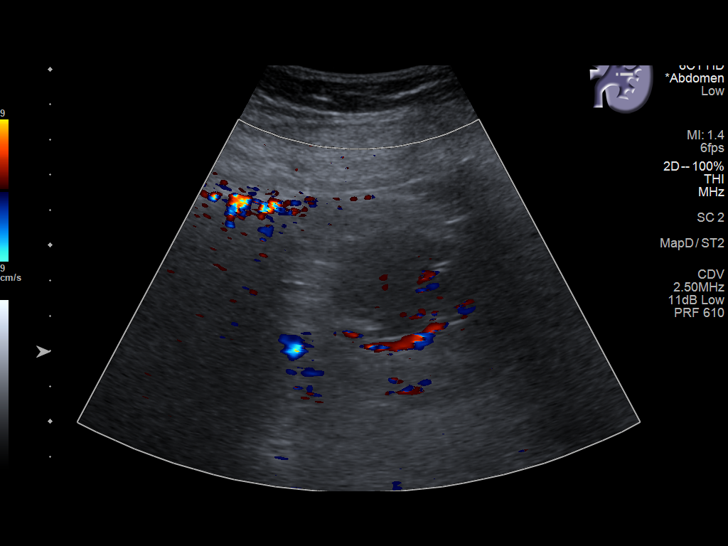
[im 85/93]
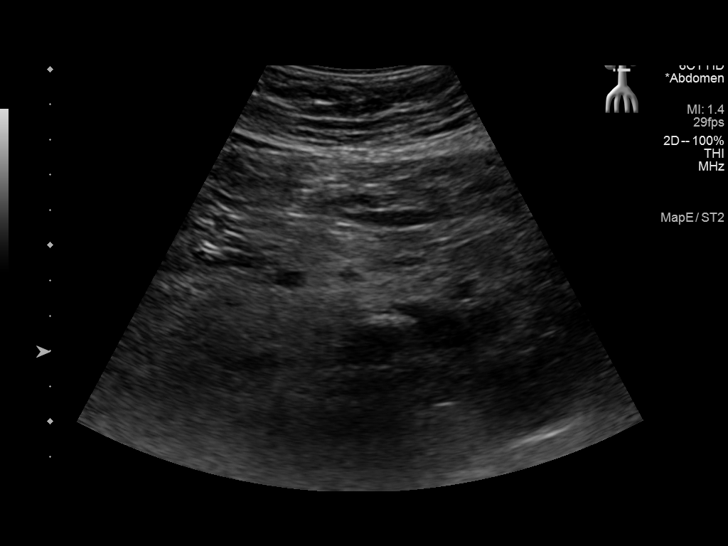
[im 93/93]
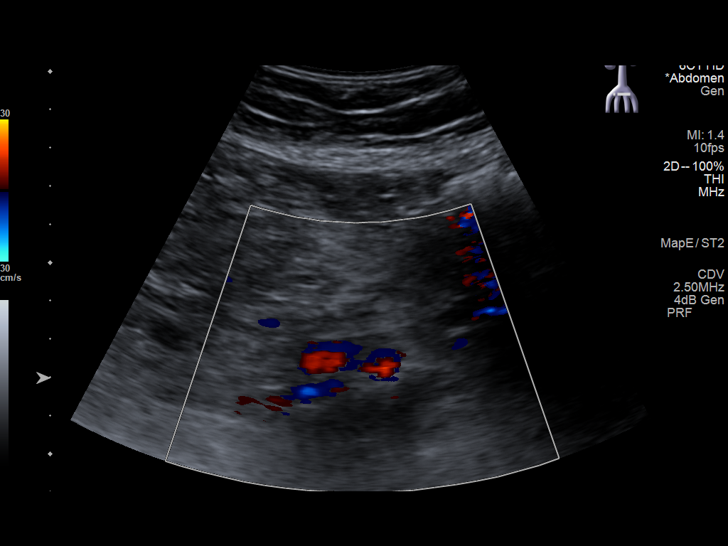

[14 of 25 positions shown; findings below may reference images not displayed]

FINDINGS: Gallbladder: No gallstones or sludge. No wall thickening visualized.
No pericholecystic fluid. No sonographic Murphy sign noted by
sonographer.

Common bile duct: Diameter: 3 mm.

Liver: No focal lesion identified. Increased parenchymal
echogenicity. Portal vein is patent on color Doppler imaging with
normal direction of blood flow towards the liver.

IVC: No abnormality visualized.

Pancreas: Visualized portion unremarkable.

Spleen: Size and appearance within normal limits.

Right Kidney: Length: 10.8 cm. Echogenicity within normal limits. No
mass or hydronephrosis visualized.

Left Kidney: Length: 10.4 cm. Echogenicity within normal limits. No
mass or hydronephrosis visualized.

Abdominal aorta: No aneurysm visualized.  Atherosclerotic plaque.

Other findings: None.
IMPRESSION: 1. Hepatic steatosis. Please note limited evaluation for focal
hepatic masses in a patient with hepatic steatosis due to decreased
penetration of the acoustic ultrasound waves.
2.  Aortic Atherosclerosis ([id number].P).

## 2023-01-13 ENCOUNTER — Emergency Department: Payer: PPO

## 2023-01-13 ENCOUNTER — Emergency Department
Admission: EM | Admit: 2023-01-13 | Discharge: 2023-01-13 | Disposition: A | Discharge status: Home / Self Care | Payer: PPO | Attending: Emergency Medicine | Admitting: Emergency Medicine

## 2023-01-13 ENCOUNTER — Other Ambulatory Visit: Payer: Self-pay

## 2023-01-13 DIAGNOSIS — W19XXXA Unspecified fall, initial encounter: Secondary | ICD-10-CM | POA: Diagnosis not present

## 2023-01-13 DIAGNOSIS — E876 Hypokalemia: Secondary | ICD-10-CM | POA: Diagnosis not present

## 2023-01-13 DIAGNOSIS — S0990XA Unspecified injury of head, initial encounter: Secondary | ICD-10-CM | POA: Diagnosis not present

## 2023-01-13 DIAGNOSIS — I10 Essential (primary) hypertension: Secondary | ICD-10-CM | POA: Diagnosis not present

## 2023-01-13 DIAGNOSIS — R531 Weakness: Secondary | ICD-10-CM | POA: Diagnosis not present

## 2023-01-13 DIAGNOSIS — Z20822 Contact with and (suspected) exposure to covid-19: Secondary | ICD-10-CM | POA: Diagnosis not present

## 2023-01-13 DIAGNOSIS — N3000 Acute cystitis without hematuria: Secondary | ICD-10-CM | POA: Insufficient documentation

## 2023-01-13 DIAGNOSIS — I491 Atrial premature depolarization: Secondary | ICD-10-CM | POA: Diagnosis not present

## 2023-01-13 DIAGNOSIS — R42 Dizziness and giddiness: Secondary | ICD-10-CM | POA: Diagnosis not present

## 2023-01-13 DIAGNOSIS — R55 Syncope and collapse: Secondary | ICD-10-CM | POA: Diagnosis not present

## 2023-01-13 LAB — URINALYSIS, ROUTINE W REFLEX MICROSCOPIC
Bilirubin Urine: NEGATIVE
Glucose, UA: NEGATIVE mg/dL
Hgb urine dipstick: NEGATIVE
Ketones, ur: NEGATIVE mg/dL
Nitrite: NEGATIVE
Protein, ur: NEGATIVE mg/dL
Specific Gravity, Urine: 1.011 (ref 1.005–1.030)
pH: 6 (ref 5.0–8.0)

## 2023-01-13 LAB — BASIC METABOLIC PANEL
Anion gap: 15 (ref 5–15)
BUN: 17 mg/dL (ref 8–23)
CO2: 21 mmol/L — ABNORMAL LOW (ref 22–32)
Calcium: 9.8 mg/dL (ref 8.9–10.3)
Chloride: 102 mmol/L (ref 98–111)
Creatinine, Ser: 1.12 mg/dL — ABNORMAL HIGH (ref 0.44–1.00)
GFR, Estimated: 49 mL/min — ABNORMAL LOW (ref >60)
Glucose, Bld: 159 mg/dL — ABNORMAL HIGH (ref 70–99)
Potassium: 3.3 mmol/L — ABNORMAL LOW (ref 3.5–5.1)
Sodium: 138 mmol/L (ref 135–145)

## 2023-01-13 LAB — CBC
HCT: 40.3 % (ref 36.0–46.0)
Hemoglobin: 13 g/dL (ref 12.0–15.0)
MCH: 30.9 pg (ref 26.0–34.0)
MCHC: 32.3 g/dL (ref 30.0–36.0)
MCV: 95.7 fL (ref 80.0–100.0)
Platelets: 144 10*3/uL — ABNORMAL LOW (ref 150–400)
RBC: 4.21 MIL/uL (ref 3.87–5.11)
RDW: 14.1 % (ref 11.5–15.5)
WBC: 10.8 10*3/uL — ABNORMAL HIGH (ref 4.0–10.5)
nRBC: 0 % (ref 0.0–0.2)

## 2023-01-13 LAB — LIPASE, BLOOD: Lipase: 34 U/L (ref 11–51)

## 2023-01-13 LAB — HEPATIC FUNCTION PANEL
ALT: 15 U/L (ref 0–44)
AST: 28 U/L (ref 15–41)
Albumin: 4.2 g/dL (ref 3.5–5.0)
Alkaline Phosphatase: 92 U/L (ref 38–126)
Bilirubin, Direct: <0.1 mg/dL (ref 0.0–0.2)
Total Bilirubin: 1.1 mg/dL (ref 0.3–1.2)
Total Protein: 7.2 g/dL (ref 6.5–8.1)

## 2023-01-13 LAB — RESP PANEL BY RT-PCR (RSV, FLU A&B, COVID)  RVPGX2
Influenza A by PCR: NEGATIVE
Influenza B by PCR: NEGATIVE
Resp Syncytial Virus by PCR: NEGATIVE
SARS Coronavirus 2 by RT PCR: NEGATIVE

## 2023-01-13 LAB — TROPONIN I (HIGH SENSITIVITY)
Troponin I (High Sensitivity): 7 ng/L (ref <18)
Troponin I (High Sensitivity): 10 ng/L (ref <18)

## 2023-01-13 MED ORDER — SODIUM CHLORIDE 0.9 % IV BOLUS
500.0000 mL | Freq: Once | INTRAVENOUS | Status: AC
  Administered 2023-01-13: 500 mL via INTRAVENOUS

## 2023-01-13 MED ORDER — NITROFURANTOIN MONOHYD MACRO 100 MG PO CAPS: 100.0000 mg | ORAL_CAPSULE | Freq: Two times a day (BID) | ORAL | 0 refills | Status: AC

## 2023-01-13 NOTE — Discharge Instructions (Signed)
Take Macrobid twice daily for seven days.  

## 2023-01-13 NOTE — ED Triage Notes (Signed)
Pt to ED via [facility] from [facility]. Pt reports she started to feel dizzy and her legs gave out causing her to fall. Pt denies pain. Pt denies LOC or head trauma.

## 2023-01-13 NOTE — ED Triage Notes (Signed)
First nurse note:Arrived by EMS from [facility]. Patients legs gave out and c/o weakness and dizziness. Has not eaten today. Blood sugar 208 with EMS and reports not diabetic.  EMS vitals: 164/72 b/p 90HR 95% RA

## 2023-01-13 NOTE — ED Provider Notes (Signed)
[facility] Provider Note  Patient Contact: 4:49 PM (approximate)   History   Fall   HPI  Leslie Patel is a 82 y.o. female presents to the emergency department after patient states that she felt like she needed to sit down while she was out shopping at [employer].  She reports that her legs felt like they were going to give out.  She denies current pain but states that she still feels weak and quite not like herself.  She denies headache, chest pain, chest tightness, abdominal pain, vomiting or diarrhea.  She has been afebrile at home.      Physical Exam   Triage Vital Signs: ED Triage Vitals [01/13/23 1445]  Enc Vitals Group     BP 131/74     Pulse Rate 99     Resp 18     Temp 98.3 F (36.8 C)     Temp Source Oral     SpO2 96 %     Weight      Height      Head Circumference      Peak Flow      Pain Score 0     Pain Loc      Pain Edu?      Excl. in [place]?     Most recent vital signs: Vitals:   01/13/23 1445 01/13/23 1857  BP: 131/74 (!) 146/67  Pulse: 99 81  Resp: 18 16  Temp: 98.3 F (36.8 C)   SpO2: 96% 95%     General: Alert and in no acute distress. Eyes:  PERRL. EOMI. Head: No acute traumatic findings ENT:      Nose: No congestion/rhinnorhea.      Mouth/Throat: Mucous membranes are moist.  Neck: No stridor. No cervical spine tenderness to palpation. Cardiovascular:  Good peripheral perfusion Respiratory: Normal respiratory effort without tachypnea or retractions. Lungs CTAB. Good air entry to the bases with no decreased or absent breath sounds. Gastrointestinal: Bowel sounds 4 quadrants. Soft and nontender to palpation. No guarding or rigidity. No palpable masses. No distention. No CVA tenderness. Musculoskeletal: Full range of motion to all extremities.  Neurologic:  No gross focal neurologic deficits are appreciated.  Skin:   No rash noted    ED Results / Procedures / Treatments   Labs (all labs ordered are listed,  but only abnormal results are displayed) Labs Reviewed  BASIC METABOLIC PANEL - Abnormal; Notable for the following components:      Result Value   Potassium 3.3 (*)    CO2 21 (*)    Glucose, Bld 159 (*)    Creatinine, Ser 1.12 (*)    GFR, Estimated 49 (*)    All other components within normal limits  URINALYSIS, ROUTINE W REFLEX MICROSCOPIC - Abnormal; Notable for the following components:   Color, Urine YELLOW (*)    APPearance CLOUDY (*)    Leukocytes,Ua SMALL (*)    Bacteria, UA FEW (*)    All other components within normal limits  CBC - Abnormal; Notable for the following components:   WBC 10.8 (*)    Platelets 144 (*)    All other components within normal limits  RESP PANEL BY RT-PCR (RSV, FLU A&B, COVID)  RVPGX2  LIPASE, BLOOD  HEPATIC FUNCTION PANEL  CBG MONITORING, ED  TROPONIN I (HIGH SENSITIVITY)  TROPONIN I (HIGH SENSITIVITY)        RADIOLOGY  I personally viewed and evaluated these images as part of my medical  decision making, as well as reviewing the written report by the radiologist.  ED Provider Interpretation: CT head shows no acute abnormality   PROCEDURES:  Critical Care performed: No  Procedures   MEDICATIONS ORDERED IN ED: Medications  sodium chloride 0.9 % bolus 500 mL (0 mLs Intravenous Stopped 01/13/23 1910)     IMPRESSION / MDM / ASSESSMENT AND PLAN / ED COURSE  I reviewed the triage vital signs and the nursing notes.                              Assessment and plan: Fall  82 year old female presents to the emergency department after she felt weak while shopping at [place].  When I asked patient what areas of her body felt weak, she pointed to her low back.  Vital signs were reassuring at triage.  On exam, patient alert, active and nontoxic-appearing.  She is able to ambulate easily back and forth from the bathroom and has symmetric strength in the upper and lower extremities.  Patient's creatinine mildly increased on BMP patient  has very mild hypokalemia.  CBC largely within reference range.  Troponin within range.  Patient was COVID and flu negative.  Urinalysis indicates bacteria and a small amount of leukocytes.  Given patient's low back pain and history of urinary tract infections, will treat patient with Macrobid for UTI.  CT head shows no acute abnormality.  Patient case was discussed with attending, Dr. Kerman Passey who agrees with management plan at this time.      FINAL CLINICAL IMPRESSION(S) / ED DIAGNOSES   Final diagnoses:  Acute cystitis without hematuria     Rx / DC Orders   ED Discharge Orders          Ordered    nitrofurantoin, macrocrystal-monohydrate, (MACROBID) 100 MG capsule  2 times daily        01/13/23 1853             Note:  This document was prepared using Dragon voice recognition software and may include unintentional dictation errors.   Vallarie Mare Oto, PA-C 01/13/23 2023    Harvest Dark, MD 01/14/23 2122

## 2023-01-13 NOTE — ED Notes (Signed)
Relayed to [place] when transporting to [facility], lab needs recollect lavender.

## 2023-01-20 ENCOUNTER — Telehealth: Payer: Self-pay | Admitting: *Deleted

## 2023-01-20 NOTE — Telephone Encounter (Signed)
        Patient  visited [facility] on 01/13/2023  for treatment   Telephone encounter attempt :  1st  Phone busy  [place] [phone] [address] , [place] [address] Email : [email or web address]

## 2023-01-29 ENCOUNTER — Emergency Department
Admission: EM | Admit: 2023-01-29 | Discharge: 2023-01-29 | Disposition: A | Discharge status: Home / Self Care | Payer: PPO | Attending: Emergency Medicine | Admitting: Emergency Medicine

## 2023-01-29 ENCOUNTER — Encounter: Payer: Self-pay | Admitting: Emergency Medicine

## 2023-01-29 ENCOUNTER — Other Ambulatory Visit: Payer: Self-pay

## 2023-01-29 DIAGNOSIS — R55 Syncope and collapse: Secondary | ICD-10-CM | POA: Diagnosis not present

## 2023-01-29 DIAGNOSIS — Z853 Personal history of malignant neoplasm of breast: Secondary | ICD-10-CM | POA: Insufficient documentation

## 2023-01-29 DIAGNOSIS — R42 Dizziness and giddiness: Secondary | ICD-10-CM | POA: Diagnosis not present

## 2023-01-29 DIAGNOSIS — R0689 Other abnormalities of breathing: Secondary | ICD-10-CM | POA: Diagnosis not present

## 2023-01-29 DIAGNOSIS — R531 Weakness: Secondary | ICD-10-CM | POA: Diagnosis not present

## 2023-01-29 DIAGNOSIS — I10 Essential (primary) hypertension: Secondary | ICD-10-CM | POA: Diagnosis not present

## 2023-01-29 DIAGNOSIS — R000 Tachycardia, unspecified: Secondary | ICD-10-CM | POA: Diagnosis not present

## 2023-01-29 LAB — CBC WITH DIFFERENTIAL/PLATELET
Abs Immature Granulocytes: 0.02 10*3/uL (ref 0.00–0.07)
Basophils Absolute: 0.1 10*3/uL (ref 0.0–0.1)
Basophils Relative: 1 %
Eosinophils Absolute: 1.5 10*3/uL — ABNORMAL HIGH (ref 0.0–0.5)
Eosinophils Relative: 16 %
HCT: 38.3 % (ref 36.0–46.0)
Hemoglobin: 12.4 g/dL (ref 12.0–15.0)
Immature Granulocytes: 0 %
Lymphocytes Relative: 36 %
Lymphs Abs: 3.2 10*3/uL (ref 0.7–4.0)
MCH: 30.8 pg (ref 26.0–34.0)
MCHC: 32.4 g/dL (ref 30.0–36.0)
MCV: 95 fL (ref 80.0–100.0)
Monocytes Absolute: 0.6 10*3/uL (ref 0.1–1.0)
Monocytes Relative: 6 %
Neutro Abs: 3.6 10*3/uL (ref 1.7–7.7)
Neutrophils Relative %: 41 %
Platelets: 236 10*3/uL (ref 150–400)
RBC: 4.03 MIL/uL (ref 3.87–5.11)
RDW: 14 % (ref 11.5–15.5)
WBC: 8.9 10*3/uL (ref 4.0–10.5)
nRBC: 0 % (ref 0.0–0.2)

## 2023-01-29 LAB — COMPREHENSIVE METABOLIC PANEL
ALT: 8 U/L (ref 0–44)
AST: 18 U/L (ref 15–41)
Albumin: 3.6 g/dL (ref 3.5–5.0)
Alkaline Phosphatase: 107 U/L (ref 38–126)
Anion gap: 11 (ref 5–15)
BUN: 13 mg/dL (ref 8–23)
CO2: 22 mmol/L (ref 22–32)
Calcium: 8.8 mg/dL — ABNORMAL LOW (ref 8.9–10.3)
Chloride: 104 mmol/L (ref 98–111)
Creatinine, Ser: 0.98 mg/dL (ref 0.44–1.00)
GFR, Estimated: 57 mL/min — ABNORMAL LOW (ref >60)
Glucose, Bld: 145 mg/dL — ABNORMAL HIGH (ref 70–99)
Potassium: 3.3 mmol/L — ABNORMAL LOW (ref 3.5–5.1)
Sodium: 137 mmol/L (ref 135–145)
Total Bilirubin: 0.8 mg/dL (ref 0.3–1.2)
Total Protein: 6.9 g/dL (ref 6.5–8.1)

## 2023-01-29 LAB — URINALYSIS, ROUTINE W REFLEX MICROSCOPIC
Bilirubin Urine: NEGATIVE
Glucose, UA: NEGATIVE mg/dL
Hgb urine dipstick: NEGATIVE
Ketones, ur: NEGATIVE mg/dL
Nitrite: NEGATIVE
Protein, ur: NEGATIVE mg/dL
Specific Gravity, Urine: 1.004 — ABNORMAL LOW (ref 1.005–1.030)
pH: 7 (ref 5.0–8.0)

## 2023-01-29 LAB — TROPONIN I (HIGH SENSITIVITY)
Troponin I (High Sensitivity): 6 ng/L (ref <18)
Troponin I (High Sensitivity): 6 ng/L (ref <18)

## 2023-01-29 MED ORDER — LACTATED RINGERS IV BOLUS
1000.0000 mL | Freq: Once | INTRAVENOUS | Status: AC
  Administered 2023-01-29: 1000 mL via INTRAVENOUS

## 2023-01-29 NOTE — ED Triage Notes (Signed)
Pt arrives via [facility] from [facility] with reports of near syncope. Pt reporting she was helped to the ground by a bystander and reports no fall. Pt recently dx with UTI.  Initial HR 130 while standing.

## 2023-01-29 NOTE — ED Provider Notes (Signed)
[facility] Provider Note    Event Date/Time   First MD Initiated Contact with Patient 01/29/23 1554     (approximate)   History   Near Syncope   HPI  Leslie Patel is a 82 y.o. female past medical history of breast cancer who presents after near syncopal episode.  Patient was at the store today when she suddenly felt weak in her legs shaky and like she was going to pass out.  Did not actually pass out or lose consciousness.  Bystanders helped her sit down and she felt improved.  She denies any preceding chest pain dyspnea palpitations.  Currently she says she feels fatigued and anxious but denying any other specific complaints.  Says she did not have much appetite this morning and did not eat or drink anything yet.  She denies fevers nasal congestion new cough.  Denies dyspnea chest pain nausea vomiting abdominal pain or urinary symptoms.  Patient says she was seen 2 weeks ago for similar and was diagnosed with UTI.  Did not have urinary symptoms at the time.     Past Medical History:  Diagnosis Date   Allergy    Bilateral cataracts    Breast cancer ([place]) 09/16/2005   Left breast, 2.1 cm histologic grade 2, pT2,N1 (mic),M0. ER 90%, PR 70%, HER-2/neu not amplified.   Cancer [facility]) 2006   left breast with radiation and chemo   Dyslipidemia    Edema    Head injury 1995   History of breast cancer    History of chemotherapy 2006   History of radiation therapy 2006   Hx of rheumatic fever    Hypertension    Hypokalemia    Insomnia    Low blood potassium    Metabolic syndrome    Osteoporosis    Personal history of chemotherapy    Personal history of radiation therapy    Shingles 2006   Stress at home    Symptomatic menopausal or female climacteric states    Vitamin D deficiency     Patient Active Problem List   Diagnosis Date Noted   Atherosclerosis of aorta ([place]) 03/13/2021   Fatty liver 03/13/2021   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   History of breast cancer 10/22/2015   Senile purpura ([place]) 07/08/2015   History of dental problems 07/08/2015   Benign essential HTN 07/04/2015   Dyslipidemia 07/04/2015   Personal history of malignant neoplasm of breast 07/04/2015   H/O: rheumatic fever 07/04/2015   Insomnia [id number]   Dysmetabolic syndrome [id number]   OP (osteoporosis) 07/04/2015   Allergic rhinitis, seasonal 07/04/2015   Vitamin D deficiency 07/04/2015   Tobacco use 07/04/2015     Physical Exam  Triage Vital Signs: ED Triage Vitals  Enc Vitals Group     BP --      Pulse Rate 01/29/23 1559 86     Resp 01/29/23 1559 17     Temp 01/29/23 1559 98.2 F (36.8 C)     Temp Source 01/29/23 1559 Oral     SpO2 01/29/23 1559 98 %     Weight --      Height --      Head Circumference --      Peak Flow --      Pain Score 01/29/23 1558 0     Pain Loc --      Pain Edu? --      Excl. in [place]? --     Most recent  vital signs: Vitals:   01/29/23 1830 01/29/23 1915  BP:  136/70  Pulse: 84 83  Resp: 18 (!) 29  Temp:    SpO2: 94% 93%     General: Awake, no distress.  CV:  Good peripheral perfusion.  No peripheral edema Resp:  Normal effort.  Lung sounds are clear, no cardiac murmurs auscultated Abd:  No distention.  Abdomen soft nontender throughout Neuro:             Awake, Alert, Oriented x 3, 5 5 strength with hip flexion bilateral lower extremities Other:     ED Results / Procedures / Treatments  Labs (all labs ordered are listed, but only abnormal results are displayed) Labs Reviewed  COMPREHENSIVE METABOLIC PANEL - Abnormal; Notable for the following components:      Result Value   Potassium 3.3 (*)    Glucose, Bld 145 (*)    Calcium 8.8 (*)    GFR, Estimated 57 (*)    All other components within normal limits  CBC WITH DIFFERENTIAL/PLATELET - Abnormal; Notable for the following components:   Eosinophils Absolute 1.5 (*)    All other components within normal limits  URINALYSIS,  ROUTINE W REFLEX MICROSCOPIC - Abnormal; Notable for the following components:   Color, Urine YELLOW (*)    APPearance HAZY (*)    Specific Gravity, Urine 1.004 (*)    Leukocytes,Ua TRACE (*)    Bacteria, UA MANY (*)    All other components within normal limits  TROPONIN I (HIGH SENSITIVITY)  TROPONIN I (HIGH SENSITIVITY)     EKG  EKG reviewed interpreted by myself shows sinus rhythm with normal axis normal intervals T wave inversion in inferior leads and V4 through V6, appears similar to prior EKG 01/14/2023   RADIOLOGY    PROCEDURES:  Critical Care performed: No  .1-3 Lead EKG Interpretation  Performed by: Rada Hay, MD Authorized by: Rada Hay, MD     Interpretation: normal     ECG rate assessment: normal     Rhythm: sinus rhythm     Ectopy: none     Conduction: normal     The patient is on the cardiac monitor to evaluate for evidence of arrhythmia and/or significant heart rate changes.   MEDICATIONS ORDERED IN ED: Medications  lactated ringers bolus 1,000 mL (0 mLs Intravenous Stopped 01/29/23 1857)     IMPRESSION / MDM / ASSESSMENT AND PLAN / ED COURSE  I reviewed the triage vital signs and the nursing notes.                              Patient's presentation is most consistent with acute complicated illness / injury requiring diagnostic workup.  Differential diagnosis includes, but is not limited to, vasovagal episode, arrhythmia, orthostatic hypotension, anemia, electrolyte abnormality, AKI, thank you ischemia  The patient is a 82 year old female who presents after near syncopal episode.  Patient was at the grocery store standing up when she suddenly felt weak in her legs like she was going to pass out.  Did not fully syncopized but was helped to sit down by bystanders and felt improved.  Currently she feels somewhat tired and fatigued as well as anxious but is denying specific symptoms otherwise.  She is denying chest pain dyspnea  abdominal pain nausea vomiting diarrhea or other infectious symptoms.  Does say she did not have much appetite this morning did not eat and drink.  EMS noted her to be tachycardic when they first arrived to the 130s.  Here her vitals are normal heart rate has normalized blood pressure is normal.  Patient overall looks well no specific findings to suggest etiology of her presyncope on physical exam today.  Patient is denying any rectal bleeding.  Plan to give a bolus of fluid will check EKG CBC CMP and a troponin given the acute onset of weakness could be an anginal equivalent in this 82 year old female.  Patient's troponins x 2 are negative.  CBC is reassuring no leukocytosis normal hemoglobin.  Renal function is near baseline.  No AKI.  Mild hypokalemia with potassium 3.3.  Urinalysis shows 11-20 white cells 6-10 squames.  Patient not having any urinary symptoms I asked again she has no urgency frequency or dysuria.  Think this is likely asymptomatic bacteriuria versus contaminated sample and will defer treatment at this time.  I did discuss with her that if she develops any urinary symptoms that she either call her primary doctor return to ED for treatment.  She is feeling improved after bolus of fluid.  I think that she can safely be discharged.  We discussed return precautions for any new or developing symptoms.       FINAL CLINICAL IMPRESSION(S) / ED DIAGNOSES   Final diagnoses:  Near syncope     Rx / DC Orders   ED Discharge Orders     None        Note:  This document was prepared using Dragon voice recognition software and may include unintentional dictation errors.   Rada Hay, MD 01/29/23 [phone]

## 2023-01-29 NOTE — Discharge Instructions (Signed)
Your blood work was reassuring.  Make sure you are staying hydrated and eating.  If you develop new symptoms that are concerning to you such as fevers abdominal pain chest pain or recurrent episodes where you feel you are going to pass out please return to the emergency department.

## 2023-02-01 ENCOUNTER — Telehealth: Payer: Self-pay

## 2023-02-01 NOTE — Telephone Encounter (Signed)
        Patient  visited [facility] on 01/29/2023  for near syncope.   Telephone encounter attempt :  1st  No answer unable to leave message.   [place] Resource Care Guide   [email or web address]  ?? [phone]   Website: [email or web address]  [email or web address]

## 2023-02-03 ENCOUNTER — Telehealth: Payer: Self-pay

## 2023-02-03 NOTE — Telephone Encounter (Signed)
     Patient  visit on 01/29/2023  at [facility] was for near syncope.  Have you been able to follow up with your primary care physician? Patient has appointment 02/04/2023.  The patient was or was not able to obtain any needed medicine or equipment. No medication prescribed.  Are there diet recommendations that you are having difficulty following? No  Patient expresses understanding of discharge instructions and education provided has no other needs at this time. Yes   [place] Resource Care Guide   ??[email or web address]  ?? [phone]   Website: [email or web address]  [email or web address]

## 2023-02-04 DIAGNOSIS — R4189 Other symptoms and signs involving cognitive functions and awareness: Secondary | ICD-10-CM | POA: Diagnosis not present

## 2023-02-04 DIAGNOSIS — F439 Reaction to severe stress, unspecified: Secondary | ICD-10-CM | POA: Diagnosis not present

## 2023-02-04 DIAGNOSIS — R55 Syncope and collapse: Secondary | ICD-10-CM | POA: Diagnosis not present

## 2023-02-04 DIAGNOSIS — E538 Deficiency of other specified B group vitamins: Secondary | ICD-10-CM | POA: Diagnosis not present

## 2023-02-04 DIAGNOSIS — I10 Essential (primary) hypertension: Secondary | ICD-10-CM | POA: Diagnosis not present

## 2023-02-28 DIAGNOSIS — H40153 Residual stage of open-angle glaucoma, bilateral: Secondary | ICD-10-CM | POA: Diagnosis not present

## 2023-03-09 ENCOUNTER — Ambulatory Visit
Admission: RE | Admit: 2023-03-09 | Discharge: 2023-03-09 | Disposition: A | Discharge status: Home / Self Care | Payer: PPO | Source: Ambulatory Visit | Attending: Family Medicine | Admitting: Family Medicine

## 2023-03-09 DIAGNOSIS — Z1231 Encounter for screening mammogram for malignant neoplasm of breast: Secondary | ICD-10-CM | POA: Insufficient documentation

## 2023-05-09 DIAGNOSIS — R7303 Prediabetes: Secondary | ICD-10-CM | POA: Diagnosis not present

## 2023-05-17 DIAGNOSIS — E782 Mixed hyperlipidemia: Secondary | ICD-10-CM | POA: Diagnosis not present

## 2023-05-17 DIAGNOSIS — I10 Essential (primary) hypertension: Secondary | ICD-10-CM | POA: Diagnosis not present

## 2023-05-17 DIAGNOSIS — E876 Hypokalemia: Secondary | ICD-10-CM | POA: Diagnosis not present

## 2023-05-17 DIAGNOSIS — R7303 Prediabetes: Secondary | ICD-10-CM | POA: Diagnosis not present

## 2023-06-01 DIAGNOSIS — G4700 Insomnia, unspecified: Secondary | ICD-10-CM | POA: Diagnosis not present

## 2023-06-01 DIAGNOSIS — F22 Delusional disorders: Secondary | ICD-10-CM | POA: Diagnosis not present

## 2023-11-25 ENCOUNTER — Other Ambulatory Visit: Payer: Self-pay | Admitting: Family Medicine

## 2023-11-25 DIAGNOSIS — Z1231 Encounter for screening mammogram for malignant neoplasm of breast: Secondary | ICD-10-CM

## 2024-07-08 ENCOUNTER — Other Ambulatory Visit: Payer: Self-pay

## 2024-07-08 ENCOUNTER — Emergency Department: Payer: No Typology Code available for payment source

## 2024-07-08 ENCOUNTER — Emergency Department
Admission: EM | Admit: 2024-07-08 | Discharge: 2024-07-08 | Disposition: A | Discharge status: Home / Self Care | Payer: No Typology Code available for payment source | Attending: Emergency Medicine | Admitting: Emergency Medicine

## 2024-07-08 DIAGNOSIS — I10 Essential (primary) hypertension: Secondary | ICD-10-CM | POA: Diagnosis not present

## 2024-07-08 DIAGNOSIS — W06XXXA Fall from bed, initial encounter: Secondary | ICD-10-CM | POA: Insufficient documentation

## 2024-07-08 DIAGNOSIS — Z853 Personal history of malignant neoplasm of breast: Secondary | ICD-10-CM | POA: Diagnosis not present

## 2024-07-08 DIAGNOSIS — E875 Hyperkalemia: Secondary | ICD-10-CM | POA: Insufficient documentation

## 2024-07-08 DIAGNOSIS — N179 Acute kidney failure, unspecified: Secondary | ICD-10-CM | POA: Insufficient documentation

## 2024-07-08 DIAGNOSIS — Z8673 Personal history of transient ischemic attack (TIA), and cerebral infarction without residual deficits: Secondary | ICD-10-CM | POA: Insufficient documentation

## 2024-07-08 DIAGNOSIS — R519 Headache, unspecified: Secondary | ICD-10-CM | POA: Insufficient documentation

## 2024-07-08 DIAGNOSIS — W19XXXA Unspecified fall, initial encounter: Secondary | ICD-10-CM

## 2024-07-08 LAB — COMPREHENSIVE METABOLIC PANEL WITH GFR
ALT: 12 U/L (ref 0–44)
AST: 21 U/L (ref 15–41)
Albumin: 4.2 g/dL (ref 3.5–5.0)
Alkaline Phosphatase: 100 U/L (ref 38–126)
Anion gap: 10 (ref 5–15)
BUN: 21 mg/dL (ref 8–23)
CO2: 27 mmol/L (ref 22–32)
Calcium: 9.7 mg/dL (ref 8.9–10.3)
Chloride: 102 mmol/L (ref 98–111)
Creatinine, Ser: 1.18 mg/dL — ABNORMAL HIGH (ref 0.44–1.00)
GFR, Estimated: 46 mL/min — ABNORMAL LOW (ref >60)
Glucose, Bld: 111 mg/dL — ABNORMAL HIGH (ref 70–99)
Potassium: 5.4 mmol/L — ABNORMAL HIGH (ref 3.5–5.1)
Sodium: 139 mmol/L (ref 135–145)
Total Bilirubin: 0.8 mg/dL (ref 0.0–1.2)
Total Protein: 7.4 g/dL (ref 6.5–8.1)

## 2024-07-08 LAB — CBC WITH DIFFERENTIAL/PLATELET
Abs Immature Granulocytes: 0.1 K/uL — ABNORMAL HIGH (ref 0.00–0.07)
Basophils Absolute: 0.1 K/uL (ref 0.0–0.1)
Basophils Relative: 1 %
Eosinophils Absolute: 0.1 K/uL (ref 0.0–0.5)
Eosinophils Relative: 1 %
HCT: 38.3 % (ref 36.0–46.0)
Hemoglobin: 12.6 g/dL (ref 12.0–15.0)
Immature Granulocytes: 1 %
Lymphocytes Relative: 26 %
Lymphs Abs: 2.8 K/uL (ref 0.7–4.0)
MCH: 31.6 pg (ref 26.0–34.0)
MCHC: 32.9 g/dL (ref 30.0–36.0)
MCV: 96 fL (ref 80.0–100.0)
Monocytes Absolute: 0.8 K/uL (ref 0.1–1.0)
Monocytes Relative: 7 %
Neutro Abs: 6.7 K/uL (ref 1.7–7.7)
Neutrophils Relative %: 64 %
Platelets: 166 K/uL (ref 150–400)
RBC: 3.99 MIL/uL (ref 3.87–5.11)
RDW: 14.6 % (ref 11.5–15.5)
WBC: 10.6 K/uL — ABNORMAL HIGH (ref 4.0–10.5)
nRBC: 0 % (ref 0.0–0.2)

## 2024-07-08 MED ORDER — SODIUM ZIRCONIUM CYCLOSILICATE 10 G PO PACK
10.0000 g | PACK | Freq: Once | ORAL | Status: DC
  Filled 2024-07-08: qty 1

## 2024-07-08 MED ORDER — SODIUM CHLORIDE 0.9 % IV BOLUS
1000.0000 mL | Freq: Once | INTRAVENOUS | Status: AC
  Administered 2024-07-08: 1000 mL via INTRAVENOUS

## 2024-07-08 NOTE — Discharge Instructions (Addendum)
 Your potassium level was high today and you should have it rechecked in about 1 week.  In the meantime, you should stop taking supplemental potassium until it has been rechecked.

## 2024-07-08 NOTE — ED Triage Notes (Signed)
 Pt arrived from [facility] via [facility] d/t a fall. Per EMS, patient was sitting up in bed and rolled over and fell off bed. Pt hit their head during the fall. Hx of recent stroke noted. Per EMS, patient had no LOC during fall, and does not use blood thinners. Pt reports pain on back of head upon arrival. EMS reports chronic left sided weakness d/t recent stroke.

## 2024-07-08 NOTE — ED Provider Notes (Signed)
 [facility] Provider Note    Event Date/Time   First MD Initiated Contact with Patient 07/08/24 2036     (approximate)   History   Chief Complaint Fall   HPI  Leslie Patel is a 83 y.o. female with past medical history of hypertension, hyperlipidemia, and breast cancer who presents to the ED complaining of fall.  Patient reports that she went to sit up in her bed at [facility] just prior to arrival when she slipped and fell out, striking her head on the floor.  She did not lose consciousness and does not take any blood thinners.  She does report a headache, denies any other areas of pain.  She has chronic left-sided weakness due to recent stroke.     Physical Exam   Triage Vital Signs: ED Triage Vitals  Encounter Vitals Group     BP 07/08/24 2046 (!) 161/67     Girls Systolic BP Percentile --      Girls Diastolic BP Percentile --      Boys Systolic BP Percentile --      Boys Diastolic BP Percentile --      Pulse Rate 07/08/24 2042 72     Resp 07/08/24 2042 18     Temp 07/08/24 2042 98.4 F (36.9 C)     Temp src --      SpO2 07/08/24 2038 97 %     Weight 07/08/24 2043 163 lb 9.6 oz (74.2 kg)     Height 07/08/24 2043 5' 3.5 (1.613 m)     Head Circumference --      Peak Flow --      Pain Score 07/08/24 2043 9     Pain Loc --      Pain Education --      Exclude from Growth Chart --     Most recent vital signs: Vitals:   07/08/24 2046 07/08/24 2349  BP: (!) 161/67 (!) 150/69  Pulse:  77  Resp:  (!) 25  Temp:    SpO2:  100%    Constitutional: Alert and oriented. Eyes: Conjunctivae are normal. Head: Atraumatic. Nose: No congestion/rhinnorhea. Mouth/Throat: Mucous membranes are moist.  Neck: No midline cervical spine tenderness to palpation. Cardiovascular: Normal rate, regular rhythm. Grossly normal heart sounds.  2+ radial pulses bilaterally. Respiratory: Normal respiratory effort.  No retractions. Lungs CTAB.  No chest wall  tenderness to palpation. Gastrointestinal: Soft and nontender. No distention. Musculoskeletal: No lower extremity tenderness nor edema.  No upper extremity bony tenderness to palpation. Neurologic:  Normal speech and language. No gross focal neurologic deficits are appreciated.    ED Results / Procedures / Treatments   Labs (all labs ordered are listed, but only abnormal results are displayed) Labs Reviewed  CBC WITH DIFFERENTIAL/PLATELET - Abnormal; Notable for the following components:      Result Value   WBC 10.6 (*)    Abs Immature Granulocytes 0.10 (*)    All other components within normal limits  COMPREHENSIVE METABOLIC PANEL WITH GFR - Abnormal; Notable for the following components:   Potassium 5.4 (*)    Glucose, Bld 111 (*)    Creatinine, Ser 1.18 (*)    GFR, Estimated 46 (*)    All other components within normal limits     RADIOLOGY CT head reviewed and interpreted by me with no hemorrhage or midline shift.  PROCEDURES:  Critical Care performed: No  Procedures   MEDICATIONS ORDERED IN ED: Medications  sodium chloride   0.9 % bolus 1,000 mL (0 mLs Intravenous Stopped 07/08/24 2354)     IMPRESSION / MDM / ASSESSMENT AND PLAN / ED COURSE  I reviewed the triage vital signs and the nursing notes.                              83 y.o. female with past medical history of hypertension, hyperlipidemia, and breast cancer who presents to the ED following fall out of bed, striking her head.  Patient's presentation is most consistent with acute presentation with potential threat to life or bodily function.  Differential diagnosis includes, but is not limited to, intracranial injury, cervical spine injury, anemia, electrolyte abnormality, AKI.  Patient nontoxic-appearing and in no acute distress, vital signs are unremarkable.  CT head and cervical spine are negative for acute traumatic injury, CT head does note acute versus subacute stroke in the posterior limb of the  right internal capsule, which patient was recently admitted to an outside hospital for.  No reason to suspect additional stroke at this time as patient with no new neurologic symptoms.  No evidence of traumatic injury to her trunk or extremities.  Labs without significant anemia or leukocytosis, she does have mild AKI and mild hyperkalemia.  She was given IV fluid bolus, counseled to stop supplemental potassium and have renal function as well as potassium levels rechecked by PCP or staff at her facility.  Family informed of these findings and patient appropriate for discharge back to nursing facility, family agrees with plan.      FINAL CLINICAL IMPRESSION(S) / ED DIAGNOSES   Final diagnoses:  Fall, initial encounter  AKI (acute kidney injury) (HCC)  Hyperkalemia     Rx / DC Orders   ED Discharge Orders     None        Note:  This document was prepared using Dragon voice recognition software and may include unintentional dictation errors.   Willo Dunnings, MD 07/09/24 [phone]

## 2024-09-05 ENCOUNTER — Emergency Department

## 2024-09-05 ENCOUNTER — Encounter: Payer: Self-pay | Admitting: *Deleted

## 2024-09-05 ENCOUNTER — Inpatient Hospital Stay
Admission: EM | Admit: 2024-09-05 | Discharge: 2024-09-07 | DRG: 175 | Disposition: A | Discharge status: Home / Self Care | Attending: Internal Medicine | Admitting: Internal Medicine

## 2024-09-05 DIAGNOSIS — G9341 Metabolic encephalopathy: Secondary | ICD-10-CM | POA: Diagnosis present

## 2024-09-05 DIAGNOSIS — Z9221 Personal history of antineoplastic chemotherapy: Secondary | ICD-10-CM | POA: Diagnosis not present

## 2024-09-05 DIAGNOSIS — Z79899 Other long term (current) drug therapy: Secondary | ICD-10-CM

## 2024-09-05 DIAGNOSIS — Z853 Personal history of malignant neoplasm of breast: Secondary | ICD-10-CM

## 2024-09-05 DIAGNOSIS — E876 Hypokalemia: Secondary | ICD-10-CM

## 2024-09-05 DIAGNOSIS — I10 Essential (primary) hypertension: Secondary | ICD-10-CM | POA: Diagnosis present

## 2024-09-05 DIAGNOSIS — Z7982 Long term (current) use of aspirin: Secondary | ICD-10-CM

## 2024-09-05 DIAGNOSIS — Z833 Family history of diabetes mellitus: Secondary | ICD-10-CM | POA: Diagnosis not present

## 2024-09-05 DIAGNOSIS — R4182 Altered mental status, unspecified: Secondary | ICD-10-CM | POA: Diagnosis present

## 2024-09-05 DIAGNOSIS — E785 Hyperlipidemia, unspecified: Secondary | ICD-10-CM | POA: Diagnosis present

## 2024-09-05 DIAGNOSIS — Z90710 Acquired absence of both cervix and uterus: Secondary | ICD-10-CM | POA: Diagnosis not present

## 2024-09-05 DIAGNOSIS — Z87891 Personal history of nicotine dependence: Secondary | ICD-10-CM

## 2024-09-05 DIAGNOSIS — I69328 Other speech and language deficits following cerebral infarction: Secondary | ICD-10-CM | POA: Diagnosis not present

## 2024-09-05 DIAGNOSIS — G9340 Encephalopathy, unspecified: Secondary | ICD-10-CM

## 2024-09-05 DIAGNOSIS — I69354 Hemiplegia and hemiparesis following cerebral infarction affecting left non-dominant side: Secondary | ICD-10-CM | POA: Diagnosis not present

## 2024-09-05 DIAGNOSIS — Z66 Do not resuscitate: Secondary | ICD-10-CM | POA: Diagnosis present

## 2024-09-05 DIAGNOSIS — Z923 Personal history of irradiation: Secondary | ICD-10-CM

## 2024-09-05 DIAGNOSIS — I2699 Other pulmonary embolism without acute cor pulmonale: Secondary | ICD-10-CM | POA: Diagnosis present

## 2024-09-05 LAB — COMPREHENSIVE METABOLIC PANEL WITH GFR
ALT: 11 U/L (ref 0–44)
AST: 19 U/L (ref 15–41)
Albumin: 4 g/dL (ref 3.5–5.0)
Alkaline Phosphatase: 104 U/L (ref 38–126)
Anion gap: 12 (ref 5–15)
BUN: 20 mg/dL (ref 8–23)
CO2: 24 mmol/L (ref 22–32)
Calcium: 9.4 mg/dL (ref 8.9–10.3)
Chloride: 103 mmol/L (ref 98–111)
Creatinine, Ser: 0.91 mg/dL (ref 0.44–1.00)
GFR, Estimated: >60 mL/min
Glucose, Bld: 159 mg/dL — ABNORMAL HIGH (ref 70–99)
Potassium: 3.9 mmol/L (ref 3.5–5.1)
Sodium: 139 mmol/L (ref 135–145)
Total Bilirubin: 0.5 mg/dL (ref 0.0–1.2)
Total Protein: 7.8 g/dL (ref 6.5–8.1)

## 2024-09-05 LAB — RESP PANEL BY RT-PCR (RSV, FLU A&B, COVID)  RVPGX2
Influenza A by PCR: NEGATIVE
Influenza B by PCR: NEGATIVE
Resp Syncytial Virus by PCR: NEGATIVE
SARS Coronavirus 2 by RT PCR: NEGATIVE

## 2024-09-05 LAB — PROTIME-INR
INR: 1 (ref 0.8–1.2)
Prothrombin Time: 14.1 s (ref 11.4–15.2)

## 2024-09-05 LAB — CBC
HCT: 37 % (ref 36.0–46.0)
Hemoglobin: 12.1 g/dL (ref 12.0–15.0)
MCH: 31.7 pg (ref 26.0–34.0)
MCHC: 32.7 g/dL (ref 30.0–36.0)
MCV: 96.9 fL (ref 80.0–100.0)
Platelets: 190 K/uL (ref 150–400)
RBC: 3.82 MIL/uL — ABNORMAL LOW (ref 3.87–5.11)
RDW: 14.1 % (ref 11.5–15.5)
WBC: 7.1 K/uL (ref 4.0–10.5)
nRBC: 0 % (ref 0.0–0.2)

## 2024-09-05 LAB — ETHANOL: Alcohol, Ethyl (B): <15 mg/dL (ref <15)

## 2024-09-05 LAB — APTT: aPTT: 28 s (ref 24–36)

## 2024-09-05 LAB — PHOSPHORUS: Phosphorus: 3.5 mg/dL (ref 2.5–4.6)

## 2024-09-05 LAB — MAGNESIUM: Magnesium: 2 mg/dL (ref 1.7–2.4)

## 2024-09-05 LAB — TSH: TSH: 3.962 u[IU]/mL (ref 0.350–4.500)

## 2024-09-05 LAB — AMMONIA: Ammonia: 20 umol/L (ref 9–35)

## 2024-09-05 MED ORDER — BISACODYL 5 MG PO TBEC: 5.0000 mg | DELAYED_RELEASE_TABLET | Freq: Every day | ORAL | Status: DC | PRN

## 2024-09-05 MED ORDER — ATORVASTATIN CALCIUM 20 MG PO TABS
20.0000 mg | ORAL_TABLET | Freq: Every day | ORAL | Status: DC
  Administered 2024-09-05 – 2024-09-07 (×3): 20 mg via ORAL
  Filled 2024-09-05 (×3): qty 1

## 2024-09-05 MED ORDER — ONDANSETRON HCL 4 MG/2ML IJ SOLN: 4.0000 mg | Freq: Four times a day (QID) | INTRAMUSCULAR | Status: DC | PRN

## 2024-09-05 MED ORDER — LATANOPROST 0.005 % OP SOLN
1.0000 [drp] | Freq: Every day | OPHTHALMIC | Status: DC
  Administered 2024-09-05: 1 [drp] via OPHTHALMIC
  Filled 2024-09-05: qty 2.5

## 2024-09-05 MED ORDER — ASPIRIN 81 MG PO CHEW
81.0000 mg | CHEWABLE_TABLET | Freq: Every day | ORAL | Status: DC
  Administered 2024-09-06 – 2024-09-07 (×2): 81 mg via ORAL
  Filled 2024-09-05 (×3): qty 1

## 2024-09-05 MED ORDER — IOHEXOL 300 MG/ML  SOLN
100.0000 mL | Freq: Once | INTRAMUSCULAR | Status: AC | PRN
  Administered 2024-09-05: 100 mL via INTRAVENOUS

## 2024-09-05 MED ORDER — ACETAMINOPHEN 325 MG PO TABS: 650.0000 mg | ORAL_TABLET | Freq: Four times a day (QID) | ORAL | Status: DC | PRN

## 2024-09-05 MED ORDER — ACETAMINOPHEN 500 MG PO TABS
1000.0000 mg | ORAL_TABLET | Freq: Once | ORAL | Status: AC
  Administered 2024-09-05: 1000 mg via ORAL
  Filled 2024-09-05: qty 2

## 2024-09-05 MED ORDER — SODIUM CHLORIDE 0.9% FLUSH
3.0000 mL | Freq: Two times a day (BID) | INTRAVENOUS | Status: DC
  Administered 2024-09-05 – 2024-09-06 (×3): 3 mL via INTRAVENOUS

## 2024-09-05 MED ORDER — ONDANSETRON HCL 4 MG PO TABS: 4.0000 mg | ORAL_TABLET | Freq: Four times a day (QID) | ORAL | Status: DC | PRN

## 2024-09-05 MED ORDER — LORAZEPAM 2 MG/ML IJ SOLN
0.5000 mg | Freq: Once | INTRAMUSCULAR | Status: AC | PRN
  Administered 2024-09-05: 0.5 mg via INTRAVENOUS
  Filled 2024-09-05: qty 1

## 2024-09-05 MED ORDER — HYDRALAZINE HCL 20 MG/ML IJ SOLN: 5.0000 mg | Freq: Four times a day (QID) | INTRAMUSCULAR | Status: DC | PRN

## 2024-09-05 MED ORDER — HEPARIN (PORCINE) 25000 UT/250ML-% IV SOLN
1150.0000 [IU]/h | INTRAVENOUS | Status: DC
  Administered 2024-09-05: 1150 [IU]/h via INTRAVENOUS
  Filled 2024-09-05: qty 250

## 2024-09-05 MED ORDER — HEPARIN BOLUS VIA INFUSION
4700.0000 [IU] | Freq: Once | INTRAVENOUS | Status: AC
  Administered 2024-09-05: 4700 [IU] via INTRAVENOUS
  Filled 2024-09-05: qty 4700

## 2024-09-05 MED ORDER — NORTRIPTYLINE HCL 10 MG PO CAPS
10.0000 mg | ORAL_CAPSULE | Freq: Every day | ORAL | Status: DC
  Administered 2024-09-05: 20 mg via ORAL
  Filled 2024-09-05 (×2): qty 2

## 2024-09-05 MED ORDER — ACETAMINOPHEN 650 MG RE SUPP: 650.0000 mg | Freq: Four times a day (QID) | RECTAL | Status: DC | PRN

## 2024-09-05 MED ORDER — SODIUM CHLORIDE 0.9 % IV BOLUS
500.0000 mL | Freq: Once | INTRAVENOUS | Status: AC
  Administered 2024-09-05: 500 mL via INTRAVENOUS

## 2024-09-05 MED ORDER — POTASSIUM CHLORIDE CRYS ER 20 MEQ PO TBCR
20.0000 meq | EXTENDED_RELEASE_TABLET | Freq: Four times a day (QID) | ORAL | Status: DC
  Administered 2024-09-06 – 2024-09-07 (×5): 20 meq via ORAL
  Filled 2024-09-05 (×5): qty 1

## 2024-09-05 MED ORDER — SENNOSIDES-DOCUSATE SODIUM 8.6-50 MG PO TABS: 1.0000 | ORAL_TABLET | Freq: Every evening | ORAL | Status: DC | PRN

## 2024-09-05 MED ORDER — SODIUM CHLORIDE 0.9 % IV SOLN: INTRAVENOUS | Status: DC

## 2024-09-05 NOTE — Consult Note (Signed)
 Pharmacy Consult Note - Anticoagulation  Pharmacy Consult for heparin Indication: pulmonary embolus  PATIENT MEASUREMENTS: Height: 5' 3 (160 cm) Weight: 72.6 kg (160 lb) IBW/kg (Calculated) : 52.4 HEPARIN DW (KG): 67.6  VITAL SIGNS: Temp: 97.7 F (36.5 C) (11/05 1523) Temp Source: Oral (11/05 1523) BP: 187/90 (11/05 1523) Pulse Rate: 123 (11/05 1523)  Recent Labs    09/05/24 1535  HGB 12.1  HCT 37.0  PLT 190  CREATININE 0.91    Estimated Creatinine Clearance: 44 mL/min (by C-G formula based on SCr of 0.91 mg/dL).  PAST MEDICAL HISTORY: Past Medical History:  Diagnosis Date   Allergy    Bilateral cataracts    Breast cancer (HCC) 09/16/2005   Left breast, 2.1 cm histologic grade 2, pT2,N1 (mic),M0. ER 90%, PR 70%, HER-2/neu not amplified.   Cancer [facility]) 2006   left breast with radiation and chemo   Dyslipidemia    Edema    Head injury 1995   History of breast cancer    History of chemotherapy 2006   History of radiation therapy 2006   Hx of rheumatic fever    Hypertension    Hypokalemia    Insomnia    Low blood potassium    Metabolic syndrome    Osteoporosis    Personal history of chemotherapy    Personal history of radiation therapy    Shingles 2006   Stress at home    Symptomatic menopausal or female climacteric states    Vitamin D  deficiency     ASSESSMENT: 83 y.o. female with PMH including CVA, HTN, HLD, history of breast cancer  is presenting with AMS and agitation. Found to have pulmonary embolus as CTAP demonstrates small pulmonary embolus in right lower lobe pulmonary arterial branch. Patient is not on chronic anticoagulation per chart review. Pharmacy has been consulted to initiate and manage heparin intravenous infusion.  Pertinent medications: No chronic anticoagulation prior to admission per chart review  Goal(s) of therapy: Heparin level 0.3 - 0.7 units/mL Monitor platelets by anticoagulation protocol: Yes   Baseline anticoagulation  labs: Recent Labs    09/05/24 1535  HGB 12.1  PLT 190    Date Time aPTT/HL Rate/Comment     PLAN: Give 4700 units bolus x1; then start heparin infusion at 1150 units/hour. Check heparin level in 8 hours, then daily once at least two levels are consecutively therapeutic. Monitor CBC daily while on heparin infusion.  Will M. Lenon, PharmD, BCPS Clinical Pharmacist 09/05/2024 6:14 PM

## 2024-09-05 NOTE — H&P (Signed)
 History and Physical   [facility] [place] @ [facility] Admission History and Physical [employer], D.O.    Patient Name: Leslie Patel MR#: [id number] Date of Birth: 11/09/1940 Date of Admission: 09/05/2024  Referring MD/NP/PA: Dr. Ozell Klein Primary Care Physician: Cletus Glenn  Chief Complaint:  Chief Complaint  Patient presents with   Altered Mental Status  Please note the entire history is obtained from the patient's emergency department chart, emergency department provider and the patient's family who is at the bedside. Patient's personal history is limited by altered mental status.   HPI: Leslie Patel is a 83 y.o. 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  Patient was in a usual state of health until yesterday when family notes that she was increasingly confused, more somnolent. Patient is awake and alert x 2 at present and denies any complaints.  She states she is feeling much better  Of note there has been a sick contact at home.  Patient lives with the cousin Madelin and husband Arley who is her healthcare power of attorney.   Otherwise there has been no change in status. Patient has been taking medication as prescribed and there has been no recent change in medication or diet.  No recent antibiotics.  There has been no recent illness, hospitalizations, travel.  EMS/ED Course: Emergency department workup did not reveal any obvious source of her altered mental status however in the workup she was found to have a small right lower lobe filling defect consistent with PE patient received heparin, normal saline. Medical admission has been requested for further management of altered mental status, PE.  Review of Systems:  Unable to obtain reliable ROS secondary to  altered mental status   Past Medical History:  Diagnosis Date   Allergy    Bilateral cataracts    Breast cancer (HCC) 09/16/2005   Left breast, 2.1 cm histologic grade 2, pT2,N1 (mic),M0. ER 90%, PR 70%, HER-2/neu not amplified.   Cancer [facility]) 2006   left breast with radiation and chemo   Dyslipidemia    Edema    Head injury 1995   History of breast cancer    History of chemotherapy 2006   History of radiation therapy 2006   Hx of rheumatic fever    Hypertension    Hypokalemia    Insomnia    Low blood potassium    Metabolic syndrome    Osteoporosis    Personal history of chemotherapy    Personal history of radiation therapy    Shingles 2006   Stress at home    Symptomatic menopausal or female climacteric states    Vitamin D  deficiency     Past Surgical History:  Procedure Laterality Date   ABDOMINAL HYSTERECTOMY     BREAST BIOPSY Left 2006   invasive lobular carcinoma   BREAST BIOPSY Right 10/22/2014   Fibrocystic changes, pseudo-angiomatous stromal hyperplasia.   BREAST EXCISIONAL BIOPSY Right 1991   benign   BREAST LUMPECTOMY Left 09/2005   invasive lobular carcinoma clear margins   BREAST SURGERY Left 2006   lumpectomy   CATARACT EXTRACTION, BILATERAL     Left-07/2014 Right-06-25-14   fracture left arm     Repair   PORT-A-CATH REMOVAL  11/2007   PORTACATH PLACEMENT  2006     reports that she has quit smoking. Her smoking use  included cigarettes. She has a 9 pack-year smoking history. She has never used smokeless tobacco. She reports that she does not drink alcohol and does not use drugs.  Allergies  Allergen Reactions   Darvon [Propoxyphene] Anaphylaxis   Loratadine Anaphylaxis   Cephalosporins    Codeine    Egg Protein-Containing Drug Products    Paroxetine    Penicillins    Sulfa Antibiotics    Xanax  [Alprazolam]    Percocet [Oxycodone-Acetaminophen] Itching, Rash and Other (See Comments)    wheezing    Family History  Problem Relation Age of  Onset   Tuberculosis Mother    Cancer Father        leukemia   Cancer Daughter        Breast   Diabetes Daughter    Rheum arthritis Daughter    Obesity Daughter    Breast cancer Daughter 4   Throat cancer Daughter    Other Brother        spinal meningitis   Kidney failure Granddaughter     Prior to Admission medications   Medication Sig Start Date End Date Taking? Authorizing Provider  aspirin 81 MG chewable tablet Chew 81 mg by mouth daily. 07/06/24 10/04/24 Yes [provider]  atorvastatin (LIPITOR) 20 MG tablet Take 20 mg by mouth daily. 07/06/24  Yes [provider]  latanoprost (XALATAN) 0.005 % ophthalmic solution Place 1 drop into both eyes at bedtime.   Yes [provider]  nortriptyline (PAMELOR) 10 MG capsule Take 10-20 mg by mouth at bedtime. 08/08/24  Yes [provider]  potassium chloride  SA (KLOR-CON ) 20 MEQ tablet TAKE 1 TABLET BY MOUTH FOUR TIMES DAILY. 07/02/21  Yes Sowles, Krichna, MD    Physical Exam: Vitals:   09/05/24 1523 09/05/24 1530 09/05/24 1600  BP: (!) 187/90  (!) 170/75  Pulse: (!) 123  (!) 104  Resp: 20  17  Temp: 97.7 F (36.5 C)    TempSrc: Oral    SpO2: 100%  98%  Weight: 72.6 kg    Height: 5' 3 (1.6 m) 5' 3 (1.6 m)     GENERAL: 83 y.o.-year-old white female patient, well-developed, well-nourished lying in the bed in no acute distress.  Pleasant and cooperative.   HEENT: Head atraumatic, normocephalic. Pupils equal. Mucus membranes moist. NECK: Supple. No JVD. CHEST: Normal breath sounds bilaterally. No wheezing, rales, rhonchi or crackles. No use of accessory muscles of respiration.  CARDIOVASCULAR: S1, S2 normal. No murmurs, rubs, or gallops. Cap refill <2 seconds. Pulses intact distally.  ABDOMEN: Soft, nondistended, nontender.  EXTREMITIES: Bilateral lower extremity edema, left greater than right.  Calf tenderness on the left NEUROLOGIC: The patient is alert and oriented x 2. PSYCHIATRIC:  Normal  affect, mood, thought content. SKIN: Warm, dry, and intact without obvious rash, lesion, or ulcer.    Labs on Admission:  CBC: Recent Labs  Lab 09/05/24 1535  WBC 7.1  HGB 12.1  HCT 37.0  MCV 96.9  PLT 190   Basic Metabolic Panel: Recent Labs  Lab 09/05/24 1535  NA 139  K 3.9  CL 103  CO2 24  GLUCOSE 159*  BUN 20  CREATININE 0.91  CALCIUM 9.4   GFR: Estimated Creatinine Clearance: 44 mL/min (by C-G formula based on SCr of 0.91 mg/dL). Liver Function Tests: Recent Labs  Lab 09/05/24 1535  AST 19  ALT 11  ALKPHOS 104  BILITOT 0.5  PROT 7.8  ALBUMIN 4.0   No results for input(s): LIPASE, AMYLASE  in the last 168 hours. Recent Labs  Lab 09/05/24 1626  AMMONIA 20   Coagulation Profile: No results for input(s): INR, PROTIME in the last 168 hours. Cardiac Enzymes: No results for input(s): CKTOTAL, CKMB, CKMBINDEX, TROPONINI in the last 168 hours. BNP (last 3 results) No results for input(s): PROBNP in the last 8760 hours. HbA1C: No results for input(s): HGBA1C in the last 72 hours. CBG: No results for input(s): GLUCAP in the last 168 hours. Lipid Profile: No results for input(s): CHOL, HDL, LDLCALC, TRIG, CHOLHDL, LDLDIRECT in the last 72 hours. Thyroid  Function Tests: Recent Labs    09/05/24 1535  TSH 3.962   Anemia Panel: No results for input(s): VITAMINB12, FOLATE, FERRITIN, TIBC, IRON, RETICCTPCT in the last 72 hours. Urine analysis:    Component Value Date/Time   COLORURINE YELLOW (A) 01/29/2023 1806   APPEARANCEUR HAZY (A) 01/29/2023 1806   LABSPEC 1.004 (L) 01/29/2023 1806   PHURINE 7.0 01/29/2023 1806   GLUCOSEU NEGATIVE 01/29/2023 1806   HGBUR NEGATIVE 01/29/2023 1806   BILIRUBINUR NEGATIVE 01/29/2023 1806   BILIRUBINUR negative 09/24/2022 1734   KETONESUR NEGATIVE 01/29/2023 1806   PROTEINUR NEGATIVE 01/29/2023 1806   UROBILINOGEN 0.2 09/24/2022 1734   NITRITE NEGATIVE 01/29/2023 1806    LEUKOCYTESUR TRACE (A) 01/29/2023 1806   Sepsis Labs: @LABRCNTIP (procalcitonin:4,lacticidven:4) )No results found for this or any previous visit (from the past 240 hours).   Radiological Exams on Admission: DG Chest Portable 1 View Result Date: 09/05/2024 CLINICAL DATA:  Altered level of consciousness, confusion EXAM: PORTABLE CHEST 1 VIEW COMPARISON:  11/02/2005 FINDINGS: Single frontal view of the chest demonstrates an unremarkable cardiac silhouette. No acute airspace disease, effusion, or pneumothorax. There are no acute displaced fractures. IMPRESSION: 1. No acute intrathoracic process. Electronically Signed   By: Ozell Daring M.D.   On: 09/05/2024 17:24   CT HEAD WO CONTRAST Result Date: 09/05/2024 EXAM: CT HEAD WITHOUT CONTRAST 09/05/2024 05:02:31 PM TECHNIQUE: CT of the head was performed without the administration of intravenous contrast. Automated exposure control, iterative reconstruction, and/or weight based adjustment of the mA/kV was utilized to reduce the radiation dose to as low as reasonably achievable. COMPARISON: 07/08/2024 CLINICAL HISTORY: Mental status change, unknown cause; ams. FINDINGS: BRAIN AND VENTRICLES: No acute hemorrhage. No evidence of acute infarct. Old right basal ganglia lacunar infarcts, stable. There is atrophy and chronic small vessel disease throughout the deep white matter. No hydrocephalus. No extra-axial collection. No mass effect or midline shift. ORBITS: No acute abnormality. SINUSES: No acute abnormality. SOFT TISSUES AND SKULL: No acute soft tissue abnormality. No skull fracture. IMPRESSION: 1. No acute intracranial abnormality. 2. Atrophy and chronic small vessel disease throughout the deep white matter. 3. Old right basal ganglia lacunar infarcts, stable. Electronically signed by: Franky Crease MD 09/05/2024 05:14 PM EST RP Workstation: [id number]   CT ABDOMEN PELVIS W CONTRAST Result Date: 09/05/2024 EXAM: CT ABDOMEN AND PELVIS WITH CONTRAST 09/05/2024  05:02:31 PM TECHNIQUE: CT of the abdomen and pelvis was performed with the administration of 100 mL of iohexol (OMNIPAQUE) 300 MG/ML solution. Multiplanar reformatted images are provided for review. Automated exposure control, iterative reconstruction, and/or weight-based adjustment of the mA/kV was utilized to reduce the radiation dose to as low as reasonably achievable. COMPARISON: None available. CLINICAL HISTORY: lower abdominal pain FINDINGS: LOWER CHEST: Incidentally noted in a right lower lobe pulmonary arterial branch is a filling defect compatible with pulmonary embolus. Calcifications in the visualized right coronary artery. No pleural effusions. LIVER: The liver is unremarkable. GALLBLADDER AND  BILE DUCTS: Gallbladder is unremarkable. No biliary ductal dilatation. SPLEEN: No acute abnormality. PANCREAS: No acute abnormality. ADRENAL GLANDS: No acute abnormality. KIDNEYS, URETERS AND BLADDER: No stones in the kidneys or ureters. No hydronephrosis. No perinephric or periureteral stranding. Urinary bladder is unremarkable. GI AND BOWEL: Stomach demonstrates no acute abnormality. There is no bowel obstruction. PERITONEUM AND RETROPERITONEUM: No ascites. No free air. VASCULATURE: Aorta is normal in caliber. Aortic atherosclerosis. LYMPH NODES: No lymphadenopathy. REPRODUCTIVE ORGANS: Prior hysterectomy. BONES AND SOFT TISSUES: No acute osseous abnormality. No focal soft tissue abnormality. IMPRESSION: 1. Right lower lobe pulmonary arterial branch filling defect compatible with small pulmonary embolus. CTA chest could be performed to assess for additional pulmonary emboli have felt clinically indicated. 2. Aortic atherosclerosis. 3. Calcifications in the visualized right coronary artery. Electronically signed by: Franky Crease MD 09/05/2024 05:13 PM EST RP Workstation: [id number]    EKG: Sinus tach at 118 with PVCs, normal axis and nonspecific ST-T wave changes.   Assessment/Plan  This is a 83 y.o. 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  Altered mental status, unclear etiology possibly dehydration as she is feeling better after of IV fluids in the emergency department - Admit inpatient telemetry - Continue with infectious workup, still needing a urinalysis - Given recent CVA will pursue an MRI - Check urine tox, ethanol and ammonia level  #.  Right lower lobe pulmonary embolism - Continue heparin infusion - Will check an bilateral lower extremity venous Doppler.   Consider doing her CTA in AM.  However she is not hypoxic nor she complaining of shortness of breath.  #.  History of CVA - Continue aspirin  #. History of hypertension - Not currently medicated, monitor.  #. History of hyperlipidemia - Continue atorvastatin   Admission status: Inpatient telemetry IV Fluids: LR Diet/Nutrition: Heart healthy Consults called: None DVT Px: Heparin, SCDs and early ambulation. Code Status: DNR/DNI Healthcare power of attorney Leslie Patel is at bedside and confirms Disposition Plan: To be determined  All the records are reviewed and case discussed with ED provider. Management plans discussed with the patient and/or family who express understanding and agree with plan of care.  Bianca Raneri D.O. on 09/05/2024 at 7:13 PM CC: Primary care physician; [facility]   09/05/2024, 7:13 PM

## 2024-09-05 NOTE — ED Provider Notes (Signed)
 [facility] Provider Note    Event Date/Time   First MD Initiated Contact with Patient 09/05/24 1547     (approximate)   History   Altered Mental Status  Pt to triage via wheelchair.  Pt from home.  Family states pt is more confused today.  Pt agitated and states they are trying to lock me up.  Pt denies chest pain or sob.  Pt has a stun gun and security took it from her and pt became upset in triage per family.   Pt ams began yesterday and worsened today per family.  Hx cva.  Pt alert.      HPI Leslie Patel is a 83 y.o. female PMH hypertension, hyperlipidemia breast cancer status post chemoradiation, recent admission in August of this year for acute CVA (discharge summary reviewed) presents for evaluation of altered mental status - Patient has no complaints or self is a somewhat limited historian.  Collateral gathered from family bedside (patient's cousin and cousin's wife, lives with patient and her also H POA) - Note the patient was being confused about place and people yesterday and today.  Also somewhat sleepier than usual. - Obvious urinary symptoms - No new focal weakness.  Has left-sided deficits at baseline from her prior stroke.  No new difficulty ambulating.  No recent trauma/falls. 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  Also found to have hypernatremia which was thought to be secondary to volume depletion and poor p.o. intake, course complicated by delirium with hallucinations.   Confirmed DNR/DNI status.     Physical Exam   Triage Vital Signs: ED Triage Vitals  Encounter Vitals Group     BP 09/05/24 1523 (!) 187/90     Girls Systolic BP Percentile --      Girls Diastolic BP Percentile --      Boys Systolic BP Percentile --      Boys Diastolic BP Percentile --      Pulse Rate 09/05/24 1523 (!) 123     Resp 09/05/24 1523 20     Temp 09/05/24 1523 97.7 F (36.5 C)     Temp Source 09/05/24 1523 Oral     SpO2 09/05/24 1523 100 %     Weight 09/05/24 1523 160 lb (72.6 kg)     Height 09/05/24 1523 5' 3 (1.6 m)     Head Circumference --      Peak Flow --      Pain Score 09/05/24 1530 8     Pain Loc --      Pain Education --      Exclude from Growth Chart --     Most recent vital signs: Vitals:   09/05/24 1600 09/05/24 2031  BP: (!) 170/75 135/70  Pulse: (!) 104 92  Resp: 17 16  Temp:  97.6 F (36.4 C)  SpO2: 98% 100%     General: Awake, no distress.  CV:  Good peripheral perfusion.  Mild tachycardia, regular rhythm, rate about 100 at time of my eval, RP  2+ Resp:  Normal effort. CTAB Abd:  No distention.  Moderate tenderness in left lower quadrant.  No significant tenderness elsewhere in abdomen.    ED Results / Procedures / Treatments   Labs (all labs ordered are listed, but only abnormal results are displayed) Labs Reviewed  COMPREHENSIVE METABOLIC PANEL WITH GFR - Abnormal; Notable for the following components:      Result Value   Glucose, Bld 159 (*)    All other components within normal limits  CBC - Abnormal; Notable for the following components:   RBC 3.82 (*)    All other components within normal limits  RESP PANEL BY RT-PCR (RSV, FLU A&B, COVID)  RVPGX2  AMMONIA  TSH  APTT  PROTIME-INR  MAGNESIUM  PHOSPHORUS  ETHANOL  URINALYSIS, ROUTINE W REFLEX MICROSCOPIC  HEPARIN LEVEL (UNFRACTIONATED)  CBC  URINE DRUG SCREEN, QUALITATIVE ([facility] ONLY)  BASIC METABOLIC PANEL WITH GFR  CBG MONITORING, ED     EKG  Ecg = sinus  tachycardia, rate 118, no gross ST elevation or depression, no significant repolarization abnormality, normal axis, normal intervals.  No clear evidence of ischemia on my read.   RADIOLOGY Radiology interpreted by myself and radiology report reviewed.  No acute intra-abdominal pathology identified though incidental right lower lobe PE noted.    PROCEDURES:  Critical Care performed: Yes, see critical care procedure note(s)  .Critical Care  Performed by: Clarine Ozell LABOR, MD Authorized by: Clarine Ozell LABOR, MD   Critical care provider statement:    Critical care time (minutes):  30   Critical care was necessary to treat or prevent imminent or life-threatening deterioration of the following conditions:  Circulatory failure (PE treating w/ heparin)   Critical care was time spent personally by me on the following activities:  Development of treatment plan with patient or surrogate, discussions with consultants, evaluation of patient's response to treatment, examination of patient, ordering and review of laboratory studies, ordering and review of radiographic studies, ordering and performing treatments and interventions, pulse oximetry, re-evaluation of patient's condition and review of old charts    MEDICATIONS ORDERED IN ED: Medications  heparin ADULT infusion 100 units/mL (25000 units/250mL) (1,150 Units/hr Intravenous New Bag/Given 09/05/24 1843)  aspirin chewable tablet 81 mg (81 mg Oral Patient Refused/Not Given 09/05/24 2118)  nortriptyline (PAMELOR) capsule 10-20 mg (20 mg Oral Given 09/05/24 2118)  atorvastatin (LIPITOR) tablet 20 mg (20 mg Oral Given 09/05/24 2118)  potassium chloride  SA (KLOR-CON  M) CR tablet 20 mEq (20 mEq Oral Patient Refused/Not Given 09/05/24 2130)  latanoprost (XALATAN) 0.005 % ophthalmic solution 1 drop (1 drop Both Eyes Given 09/05/24 2119)  sodium chloride  flush (NS) 0.9 % injection 3 mL (3 mLs Intravenous Given 09/05/24 2131)  0.9 %  sodium chloride  infusion (  Intravenous New Bag/Given 09/05/24 2138)  acetaminophen (TYLENOL) tablet 650 mg (has no administration in time range)    Or  acetaminophen (TYLENOL) suppository 650 mg (has no administration in time range)  senna-docusate (Senokot-S) tablet 1 tablet (has no administration in time range)  bisacodyl (DULCOLAX) EC tablet 5 mg (has no administration in time range)  ondansetron (ZOFRAN) tablet 4 mg (has no administration in time range)    Or  ondansetron (ZOFRAN) injection 4 mg (has no administration in time range)  hydrALAZINE (APRESOLINE) injection 5 mg (has no administration in time range)  sodium chloride  0.9 % bolus 500 mL (0 mLs Intravenous Stopped 09/05/24 1830)  acetaminophen (TYLENOL) tablet 1,000 mg (1,000 mg Oral Given 09/05/24 1621)  iohexol (OMNIPAQUE) 300 MG/ML solution 100 mL (100 mLs Intravenous Contrast Given 09/05/24 1644)  heparin bolus via infusion 4,700 Units (4,700 Units Intravenous Bolus from Bag 09/05/24 1843)  LORazepam (ATIVAN) injection 0.5 mg (0.5 mg Intravenous Given 09/05/24 2300)     IMPRESSION / MDM / ASSESSMENT AND PLAN / ED COURSE  I reviewed the triage vital signs and the nursing notes.                              DDX/MDM/AP: Differential diagnosis includes, but is not limited to, diverticulitis, appendicitis, UTI.  Consider recurrent CVA though no obvious new neurologic findings on my initial eval.  Consider but doubt intracranial bleed.  Consider metabolic abnormality.  Consider pneumonia.  Plan: - Labs - Chest x-ray EKG - CT head, CT abdomen pelvis - Tylenol  Patient's presentation is most consistent with acute presentation with potential threat to life or bodily function.  The patient is on the cardiac monitor to evaluate for evidence of arrhythmia and/or significant heart rate changes.  ED course below.  Workup with incidental finding of right lower lobe PE, no other clear pathology at this time.  No clear source of altered mental status, urinalysis  pending.  CT head with no acute pathology.  Given advanced age, new pulmonary embolism with some tachycardia here, ongoing AMS I do believe she would be better served in the hospital.  Heparin initiated.  Admitted to hospitalist service.  Clinical Course as of 09/05/24 2348  Wed Sep 05, 2024  1557 CBC reviewed, unremarkable [MM]  1641 CMP reviewed, unremarkable [MM]  1730 TSH wnl Ammonia wnl [MM]  1730 CTAP: IMPRESSION: 1. Right lower lobe pulmonary arterial branch filling defect compatible with small pulmonary embolus. CTA chest could be performed to assess for additional pulmonary emboli have felt clinically indicated. 2. Aortic atherosclerosis. 3. Calcifications in the visualized right coronary artery.   [MM]  1730 CTH: IMPRESSION: 1. No acute intracranial abnormality. 2. Atrophy and chronic small vessel disease throughout the deep white matter. 3. Old right basal ganglia lacunar infarcts, stable.   [MM]  1731 CXR: IMPRESSION: 1. No acute intrathoracic process.   [MM]  1805 Family updated, amenable to admission  Heparin ordered.  This does explain the tachycardia as well as left-sided abdominal pain and shoulder pain that patient has had.  Remains hemodynamically stable here, do not suspect massive underlying PE, will initiate heparin and defer repeat contrast load for CTA chest at this time  Unclear etiology of AMS at this time.  No findings for new stroke as of yet.  Urinalysis pending.  Patient remains mentating well here in emergency department from my perspective.  Hospitalist consult order placed. [MM]    Clinical Course User Index [MM] Clarine Ozell LABOR, MD     FINAL CLINICAL IMPRESSION(S) / ED DIAGNOSES   Final diagnoses:  Other acute pulmonary embolism, unspecified whether acute cor pulmonale present (HCC)  Altered mental status, unspecified altered mental status type     Rx / DC Orders   ED Discharge Orders     None        Note:  This document  was prepared using Dragon voice recognition software and may include unintentional dictation errors.   Clarine Ozell LABOR, MD 09/05/24 [phone]

## 2024-09-05 NOTE — ED Triage Notes (Addendum)
 Pt to triage via wheelchair.  Pt from home.  Family states pt is more confused today.  Pt agitated and states they are trying to lock me up.  Pt denies chest pain or sob.  Pt has a stun gun and security took it from her and pt became upset in triage per family.   Pt ams began yesterday and worsened today per family.  Hx cva.  Pt alert.

## 2024-09-06 ENCOUNTER — Other Ambulatory Visit: Payer: Self-pay

## 2024-09-06 ENCOUNTER — Other Ambulatory Visit (HOSPITAL_COMMUNITY): Payer: Self-pay

## 2024-09-06 ENCOUNTER — Inpatient Hospital Stay

## 2024-09-06 ENCOUNTER — Telehealth (HOSPITAL_COMMUNITY): Payer: Self-pay | Admitting: Pharmacy Technician

## 2024-09-06 DIAGNOSIS — I2699 Other pulmonary embolism without acute cor pulmonale: Secondary | ICD-10-CM

## 2024-09-06 DIAGNOSIS — R4182 Altered mental status, unspecified: Secondary | ICD-10-CM

## 2024-09-06 LAB — URINALYSIS, ROUTINE W REFLEX MICROSCOPIC
Bilirubin Urine: NEGATIVE
Glucose, UA: NEGATIVE mg/dL
Hgb urine dipstick: NEGATIVE
Ketones, ur: NEGATIVE mg/dL
Leukocytes,Ua: NEGATIVE
Nitrite: NEGATIVE
Protein, ur: NEGATIVE mg/dL
Specific Gravity, Urine: 1.033 — ABNORMAL HIGH (ref 1.005–1.030)
pH: 5 (ref 5.0–8.0)

## 2024-09-06 LAB — URINE DRUG SCREEN, QUALITATIVE (ARMC ONLY)
Amphetamines, Ur Screen: NOT DETECTED
Barbiturates, Ur Screen: NOT DETECTED
Benzodiazepine, Ur Scrn: NOT DETECTED
Cannabinoid 50 Ng, Ur ~~LOC~~: NOT DETECTED
Cocaine Metabolite,Ur ~~LOC~~: NOT DETECTED
MDMA (Ecstasy)Ur Screen: NOT DETECTED
Methadone Scn, Ur: NOT DETECTED
Opiate, Ur Screen: NOT DETECTED
Phencyclidine (PCP) Ur S: NOT DETECTED
Tricyclic, Ur Screen: POSITIVE — AB

## 2024-09-06 LAB — CBC
HCT: 30.5 % — ABNORMAL LOW (ref 36.0–46.0)
Hemoglobin: 10.2 g/dL — ABNORMAL LOW (ref 12.0–15.0)
MCH: 32 pg (ref 26.0–34.0)
MCHC: 33.4 g/dL (ref 30.0–36.0)
MCV: 95.6 fL (ref 80.0–100.0)
Platelets: 153 K/uL (ref 150–400)
RBC: 3.19 MIL/uL — ABNORMAL LOW (ref 3.87–5.11)
RDW: 13.9 % (ref 11.5–15.5)
WBC: 7.8 K/uL (ref 4.0–10.5)
nRBC: 0 % (ref 0.0–0.2)

## 2024-09-06 LAB — BASIC METABOLIC PANEL WITH GFR
Anion gap: 11 (ref 5–15)
BUN: 17 mg/dL (ref 8–23)
CO2: 24 mmol/L (ref 22–32)
Calcium: 8.6 mg/dL — ABNORMAL LOW (ref 8.9–10.3)
Chloride: 105 mmol/L (ref 98–111)
Creatinine, Ser: 0.88 mg/dL (ref 0.44–1.00)
GFR, Estimated: >60 mL/min (ref >60)
Glucose, Bld: 91 mg/dL (ref 70–99)
Potassium: 3.5 mmol/L (ref 3.5–5.1)
Sodium: 140 mmol/L (ref 135–145)

## 2024-09-06 LAB — HEPARIN LEVEL (UNFRACTIONATED)
Heparin Unfractionated: 0.56 [IU]/mL (ref 0.30–0.70)
Heparin Unfractionated: 0.37 [IU]/mL (ref 0.30–0.70)

## 2024-09-06 MED ORDER — IOHEXOL 350 MG/ML SOLN
75.0000 mL | Freq: Once | INTRAVENOUS | Status: AC | PRN
  Administered 2024-09-06: 75 mL via INTRAVENOUS

## 2024-09-06 MED ORDER — APIXABAN 5 MG PO TABS
10.0000 mg | ORAL_TABLET | Freq: Two times a day (BID) | ORAL | Status: DC
  Administered 2024-09-06 – 2024-09-07 (×3): 10 mg via ORAL
  Filled 2024-09-06 (×3): qty 2

## 2024-09-06 MED ORDER — APIXABAN 5 MG PO TABS: 5.0000 mg | ORAL_TABLET | Freq: Two times a day (BID) | ORAL | Status: DC

## 2024-09-06 MED ORDER — HALOPERIDOL LACTATE 5 MG/ML IJ SOLN
1.0000 mg | Freq: Four times a day (QID) | INTRAMUSCULAR | Status: DC | PRN
Start: 2024-09-06 — End: 2024-09-07
  Administered 2024-09-06: 1 mg via INTRAVENOUS
  Filled 2024-09-06: qty 1

## 2024-09-06 MED ORDER — LORAZEPAM 2 MG/ML IJ SOLN
2.0000 mg | Freq: Once | INTRAMUSCULAR | Status: AC
  Administered 2024-09-06: 2 mg via INTRAVENOUS
  Filled 2024-09-06: qty 1

## 2024-09-06 NOTE — Telephone Encounter (Signed)
 Patient Product/process Development Scientist completed.    The patient is insured through [employer]. Patient has Medicare and is not eligible for a copay card, but may be able to apply for patient assistance or Medicare RX Payment Plan (Patient Must reach out to their plan, if eligible for payment plan), if available.    Ran test claim for Eliquis 5 mg and the current 30 day co-pay is $485.07 due to a $590.00 deductible.   This test claim was processed through [facility]- copay amounts may vary at other pharmacies due to pharmacy/plan contracts, or as the patient moves through the different stages of their insurance plan.     Reyes Sharps, CPHT Pharmacy Technician Patient Advocate Specialist Lead [facility] Patient Advocate Team Direct Number: [phone]  Fax: [phone]

## 2024-09-06 NOTE — Plan of Care (Signed)

## 2024-09-06 NOTE — Plan of Care (Signed)

## 2024-09-06 NOTE — Progress Notes (Signed)
 Patient is from home and presented with AMS. Patient with history of recent CVA and is being w/u for CVA. No TOC needs at this time. Please outreach to [facility] if needs arise.

## 2024-09-06 NOTE — Consult Note (Signed)
 Pharmacy Consult Note - Anticoagulation  Pharmacy Consult for heparin Indication: pulmonary embolus  PATIENT MEASUREMENTS: Height: 5' 3 (160 cm) Weight: 72.6 kg (160 lb) IBW/kg (Calculated) : 52.4 HEPARIN DW (KG): 67.6  VITAL SIGNS: Temp: 98.6 F (37 C) (11/06 1151) Temp Source: Oral (11/06 1151) BP: 139/66 (11/06 1151) Pulse Rate: 85 (11/06 1151)  Recent Labs    09/05/24 1535 09/05/24 1835 09/06/24 0314 09/06/24 1118  HGB  --   --  10.2*  --   HCT  --   --  30.5*  --   PLT  --   --  153  --   APTT  --  28  --   --   LABPROT  --  14.1  --   --   INR  --  1.0  --   --   HEPARINUNFRC   < >  --  0.56 0.37  CREATININE  --   --  0.88  --    < > = values in this interval not displayed.    Estimated Creatinine Clearance: 45.5 mL/min (by C-G formula based on SCr of 0.88 mg/dL).  PAST MEDICAL HISTORY: Past Medical History:  Diagnosis Date   Allergy    Bilateral cataracts    Breast cancer (HCC) 09/16/2005   Left breast, 2.1 cm histologic grade 2, pT2,N1 (mic),M0. ER 90%, PR 70%, HER-2/neu not amplified.   Cancer [facility]) 2006   left breast with radiation and chemo   Dyslipidemia    Edema    Head injury 1995   History of breast cancer    History of chemotherapy 2006   History of radiation therapy 2006   Hx of rheumatic fever    Hypertension    Hypokalemia    Insomnia    Low blood potassium    Metabolic syndrome    Osteoporosis    Personal history of chemotherapy    Personal history of radiation therapy    Shingles 2006   Stress at home    Symptomatic menopausal or female climacteric states    Vitamin D  deficiency     ASSESSMENT: 83 y.o. female with PMH including CVA, HTN, HLD, history of breast cancer  is presenting with AMS and agitation. Found to have pulmonary embolus as CTAP demonstrates small pulmonary embolus in right lower lobe pulmonary arterial branch. Patient is not on chronic anticoagulation per chart review. Pharmacy has been consulted to initiate  and manage heparin intravenous infusion.  Pertinent medications: No chronic anticoagulation prior to admission per chart review  Goal(s) of therapy: Heparin level 0.3 - 0.7 units/mL Monitor platelets by anticoagulation protocol: Yes   Baseline anticoagulation labs: Recent Labs    09/05/24 1535 09/05/24 1835 09/06/24 0314  APTT  --  28  --   INR  --  1.0  --   HGB 12.1  --  10.2*  PLT 190  --  153    Date Time aPTT/HL Rate/Comment 11/6     0314      0.56                1150 units/hr- Therapeutic x 1  11/6 1118   0.37    1150 units/hr- Therapeutic x 2   PLAN: 11/6:  HL @ 1118 = 0.37, therapeutic X 2 - Will continue pt on current rate  of 1150 units/hr  Check heparin  daily not that therapeutic x 2 levels.  Monitor CBC daily while on heparin infusion.  Estill CHRISTELLA Lutes, PharmD, BCPS Clinical  Pharmacist 09/06/2024 12:23 PM

## 2024-09-06 NOTE — Consult Note (Signed)
 Pharmacy Consult Note - Anticoagulation  Pharmacy Consult for heparin Indication: pulmonary embolus  PATIENT MEASUREMENTS: Height: 5' 3 (160 cm) Weight: 72.6 kg (160 lb) IBW/kg (Calculated) : 52.4 HEPARIN DW (KG): 67.6  VITAL SIGNS: Temp: 97.6 F (36.4 C) (11/05 2031) Temp Source: Oral (11/05 2031) BP: 121/71 (11/06 0012) Pulse Rate: 81 (11/06 0012)  Recent Labs    09/05/24 1535 09/05/24 1835 09/06/24 0314  HGB 12.1  --  10.2*  HCT 37.0  --  30.5*  PLT 190  --  153  APTT  --  28  --   LABPROT  --  14.1  --   INR  --  1.0  --   HEPARINUNFRC  --   --  0.56  CREATININE 0.91  --   --     Estimated Creatinine Clearance: 44 mL/min (by C-G formula based on SCr of 0.91 mg/dL).  PAST MEDICAL HISTORY: Past Medical History:  Diagnosis Date   Allergy    Bilateral cataracts    Breast cancer (HCC) 09/16/2005   Left breast, 2.1 cm histologic grade 2, pT2,N1 (mic),M0. ER 90%, PR 70%, HER-2/neu not amplified.   Cancer [facility]) 2006   left breast with radiation and chemo   Dyslipidemia    Edema    Head injury 1995   History of breast cancer    History of chemotherapy 2006   History of radiation therapy 2006   Hx of rheumatic fever    Hypertension    Hypokalemia    Insomnia    Low blood potassium    Metabolic syndrome    Osteoporosis    Personal history of chemotherapy    Personal history of radiation therapy    Shingles 2006   Stress at home    Symptomatic menopausal or female climacteric states    Vitamin D  deficiency     ASSESSMENT: 83 y.o. female with PMH including CVA, HTN, HLD, history of breast cancer  is presenting with AMS and agitation. Found to have pulmonary embolus as CTAP demonstrates small pulmonary embolus in right lower lobe pulmonary arterial branch. Patient is not on chronic anticoagulation per chart review. Pharmacy has been consulted to initiate and manage heparin intravenous infusion.  Pertinent medications: No chronic anticoagulation prior to  admission per chart review  Goal(s) of therapy: Heparin level 0.3 - 0.7 units/mL Monitor platelets by anticoagulation protocol: Yes   Baseline anticoagulation labs: Recent Labs    09/05/24 1535 09/05/24 1835 09/06/24 0314  APTT  --  28  --   INR  --  1.0  --   HGB 12.1  --  10.2*  PLT 190  --  153    Date Time aPTT/HL Rate/Comment 11/6     0314      0.56                1150 units/hr  PLAN: 11/6:  HL @ 0314 = 0.56, therapeutic X 1 - Will continue pt on current rate and recheck HL in 8 hrs  Check heparin level in 8 hours, then daily once at least two levels are consecutively therapeutic. Monitor CBC daily while on heparin infusion.  Taraoluwa Thakur D Clinical Pharmacist 09/06/2024 3:41 AM

## 2024-09-06 NOTE — Progress Notes (Signed)
 [facility]  - [place] at [facility]   PATIENT NAME: Leslie Patel    MR#:  [id number]  DATE OF BIRTH:  [birth year]  SUBJECTIVE:  Leslie Patel (family/caregiver) Pt awake and answers most questions appropriately Denies any complaints    VITALS:  Blood pressure 139/66, pulse 85, temperature 98.6 F (37 C), temperature source Oral, resp. rate 18, height 5' 3 (1.6 m), weight 72.6 kg, SpO2 98%.  PHYSICAL EXAMINATION:   GENERAL:  83 y.o.-year-old patient with no acute distress. obese LUNGS: Normal breath sounds bilaterally, no wheezing CARDIOVASCULAR: S1, S2 normal. No murmur   ABDOMEN: Soft, nontender, nondistended. Bowel sounds present.  EXTREMITIES: No  edema b/l.    NEUROLOGIC: nonfocal  patient is alert and awake   LABORATORY PANEL:  CBC Recent Labs  Lab 09/06/24 0314  WBC 7.8  HGB 10.2*  HCT 30.5*  PLT 153    Chemistries  Recent Labs  Lab 09/05/24 1535 09/05/24 2129 09/06/24 0314  NA 139  --  140  K 3.9  --  3.5  CL 103  --  105  CO2 24  --  24  GLUCOSE 159*  --  91  BUN 20  --  17  CREATININE 0.91  --  0.88  CALCIUM 9.4  --  8.6*  MG  --  2.0  --   AST 19  --   --   ALT 11  --   --   ALKPHOS 104  --   --   BILITOT 0.5  --   --    Cardiac Enzymes No results for input(s): TROPONINI in the last 168 hours. RADIOLOGY:  MR BRAIN WO CONTRAST Result Date: 09/06/2024 EXAM: MRI BRAIN WITHOUT CONTRAST 09/06/2024 02:01:44 PM TECHNIQUE: Multiplanar multisequence MRI of the head/brain was performed without the administration of intravenous contrast. COMPARISON: Head CT 09/05/2024. CLINICAL HISTORY: Mental status change, unknown cause. FINDINGS: The examination is moderately motion degraded. BRAIN AND VENTRICLES: There is no evidence of an acute infarct, mass, midline shift, hydrocephalus, or extra axial fluid collection. Chronic infarcts are again noted in the right basal ganglia and deep white matter with associated chronic blood products and ex vacuo  dilatation of the right lateral ventricle. Wallerian degeneration extends into the brainstem. There is mild global cerebral atrophy. Major intracranial vascular flow voids are preserved. ORBITS: Bilateral cataract extraction. SINUSES AND MASTOIDS: Trace bilateral mastoid fluid. Clear paranasal sinuses. BONES AND SOFT TISSUES: Normal marrow signal. 9 mm cyst near the right temporomandibular joint. IMPRESSION: 1. No acute intracranial abnormality. 2. Chronic right basal ganglia region infarcts. Electronically signed by: Dasie Hamburg MD 09/06/2024 03:11 PM EST RP Workstation: [id number]   CT Angio Chest Pulmonary Embolism (PE) W or WO Contrast Result Date: 09/06/2024 CLINICAL DATA:  Concern for pulmonary sub. EXAM: CT ANGIOGRAPHY CHEST WITH CONTRAST TECHNIQUE: Multidetector CT imaging of the chest was performed using the standard protocol during bolus administration of intravenous contrast. Multiplanar CT image reconstructions and MIPs were obtained to evaluate the vascular anatomy. RADIATION DOSE REDUCTION: This exam was performed according to the departmental dose-optimization program which includes automated exposure control, adjustment of the mA and/or kV according to patient size and/or use of iterative reconstruction technique. CONTRAST:  75mL OMNIPAQUE IOHEXOL 350 MG/ML SOLN COMPARISON:  Chest radiograph dated 09/05/2024. FINDINGS: Evaluation is limited due to streak artifact caused by patient's arms. Cardiovascular: There is no cardiomegaly or pericardial effusion. There is 3 vessel coronary vascular calcification. Moderate atherosclerotic calcification of the thoracic aorta. No aneurysmal dilatation. Nonocclusive  linear segmental and subsegmental emboli bilaterally, age indeterminate. No CT evidence of right heart straining. Mediastinum/Nodes: No hilar or mediastinal adenopathy. The esophagus and the thyroid  gland are grossly unremarkable. No mediastinal fluid collection. Lungs/Pleura: Trace right pleural  effusion. No consolidative changes. There is no pneumothorax. The central airways are patent. Upper Abdomen: No acute abnormality. Musculoskeletal: Degenerative changes of the spine. No acute osseous pathology. Review of the MIP images confirms the above findings. IMPRESSION: 1. Age indeterminate nonocclusive segmental and subsegmental pulmonary artery emboli bilaterally. No CT evidence of right heart straining. 2. Trace right pleural effusion. 3.  Aortic Atherosclerosis (ICD10-I70.0). These results will be called to the ordering clinician or representative by the Radiologist Assistant, and communication documented in the PACS or [employer]. Electronically Signed   By: Vanetta Chou M.D.   On: 09/06/2024 14:45   US  Venous Img Lower Bilateral (DVT) Result Date: 09/06/2024 CLINICAL DATA:  Possible small pulmonary embolism in the right lower lobe by CT abdomen. EXAM: BILATERAL LOWER EXTREMITY VENOUS DOPPLER ULTRASOUND TECHNIQUE: Gray-scale sonography with graded compression, as well as color Doppler and duplex ultrasound were performed to evaluate the lower extremity deep venous systems from the level of the common femoral vein and including the common femoral, femoral, profunda femoral, popliteal and calf veins including the posterior tibial, peroneal and gastrocnemius veins when visible. The superficial great saphenous vein was also interrogated. Spectral Doppler was utilized to evaluate flow at rest and with distal augmentation maneuvers in the common femoral, femoral and popliteal veins. COMPARISON:  None Available. FINDINGS: RIGHT LOWER EXTREMITY Common Femoral Vein: No evidence of thrombus. Normal compressibility, respiratory phasicity and response to augmentation. Saphenofemoral Junction: No evidence of thrombus. Normal compressibility and flow on color Doppler imaging. Profunda Femoral Vein: No evidence of thrombus. Normal compressibility and flow on color Doppler imaging. Femoral Vein: No evidence  of thrombus. Normal compressibility, respiratory phasicity and response to augmentation. Popliteal Vein: No evidence of thrombus. Normal compressibility, respiratory phasicity and response to augmentation. Calf Veins: No evidence of thrombus. Normal compressibility and flow on color Doppler imaging. Superficial Great Saphenous Vein: No evidence of thrombus. Normal compressibility. Venous Reflux:  None. Other Findings: No evidence of superficial thrombophlebitis or abnormal fluid collection. LEFT LOWER EXTREMITY Common Femoral Vein: No evidence of thrombus. Normal compressibility, respiratory phasicity and response to augmentation. Saphenofemoral Junction: No evidence of thrombus. Normal compressibility and flow on color Doppler imaging. Profunda Femoral Vein: No evidence of thrombus. Normal compressibility and flow on color Doppler imaging. Femoral Vein: No evidence of thrombus. Normal compressibility, respiratory phasicity and response to augmentation. Popliteal Vein: No evidence of thrombus. Normal compressibility, respiratory phasicity and response to augmentation. Calf Veins: No evidence of thrombus. Normal compressibility and flow on color Doppler imaging. Superficial Great Saphenous Vein: No evidence of thrombus. Normal compressibility. Venous Reflux:  None. Other Findings: No evidence of superficial thrombophlebitis or abnormal fluid collection. IMPRESSION: No evidence of deep venous thrombosis in either lower extremity. Electronically Signed   By: Marcey Moan M.D.   On: 09/06/2024 11:16   DG Chest Portable 1 View Result Date: 09/05/2024 CLINICAL DATA:  Altered level of consciousness, confusion EXAM: PORTABLE CHEST 1 VIEW COMPARISON:  11/02/2005 FINDINGS: Single frontal view of the chest demonstrates an unremarkable cardiac silhouette. No acute airspace disease, effusion, or pneumothorax. There are no acute displaced fractures. IMPRESSION: 1. No acute intrathoracic process. Electronically Signed   By:  Ozell Daring M.D.   On: 09/05/2024 17:24   CT HEAD WO CONTRAST Result Date: 09/05/2024  EXAM: CT HEAD WITHOUT CONTRAST 09/05/2024 05:02:31 PM TECHNIQUE: CT of the head was performed without the administration of intravenous contrast. Automated exposure control, iterative reconstruction, and/or weight based adjustment of the mA/kV was utilized to reduce the radiation dose to as low as reasonably achievable. COMPARISON: 07/08/2024 CLINICAL HISTORY: Mental status change, unknown cause; ams. FINDINGS: BRAIN AND VENTRICLES: No acute hemorrhage. No evidence of acute infarct. Old right basal ganglia lacunar infarcts, stable. There is atrophy and chronic small vessel disease throughout the deep white matter. No hydrocephalus. No extra-axial collection. No mass effect or midline shift. ORBITS: No acute abnormality. SINUSES: No acute abnormality. SOFT TISSUES AND SKULL: No acute soft tissue abnormality. No skull fracture. IMPRESSION: 1. No acute intracranial abnormality. 2. Atrophy and chronic small vessel disease throughout the deep white matter. 3. Old right basal ganglia lacunar infarcts, stable. Electronically signed by: Franky Crease MD 09/05/2024 05:14 PM EST RP Workstation: [id number]   CT ABDOMEN PELVIS W CONTRAST Result Date: 09/05/2024 EXAM: CT ABDOMEN AND PELVIS WITH CONTRAST 09/05/2024 05:02:31 PM TECHNIQUE: CT of the abdomen and pelvis was performed with the administration of 100 mL of iohexol (OMNIPAQUE) 300 MG/ML solution. Multiplanar reformatted images are provided for review. Automated exposure control, iterative reconstruction, and/or weight-based adjustment of the mA/kV was utilized to reduce the radiation dose to as low as reasonably achievable. COMPARISON: None available. CLINICAL HISTORY: lower abdominal pain FINDINGS: LOWER CHEST: Incidentally noted in a right lower lobe pulmonary arterial branch is a filling defect compatible with pulmonary embolus. Calcifications in the visualized right coronary  artery. No pleural effusions. LIVER: The liver is unremarkable. GALLBLADDER AND BILE DUCTS: Gallbladder is unremarkable. No biliary ductal dilatation. SPLEEN: No acute abnormality. PANCREAS: No acute abnormality. ADRENAL GLANDS: No acute abnormality. KIDNEYS, URETERS AND BLADDER: No stones in the kidneys or ureters. No hydronephrosis. No perinephric or periureteral stranding. Urinary bladder is unremarkable. GI AND BOWEL: Stomach demonstrates no acute abnormality. There is no bowel obstruction. PERITONEUM AND RETROPERITONEUM: No ascites. No free air. VASCULATURE: Aorta is normal in caliber. Aortic atherosclerosis. LYMPH NODES: No lymphadenopathy. REPRODUCTIVE ORGANS: Prior hysterectomy. BONES AND SOFT TISSUES: No acute osseous abnormality. No focal soft tissue abnormality. IMPRESSION: 1. Right lower lobe pulmonary arterial branch filling defect compatible with small pulmonary embolus. CTA chest could be performed to assess for additional pulmonary emboli have felt clinically indicated. 2. Aortic atherosclerosis. 3. Calcifications in the visualized right coronary artery. Electronically signed by: Franky Crease MD 09/05/2024 05:13 PM EST RP Workstation: [id number]    Assessment and Plan  [facility] Fulmore is a 83 y.o. 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  Patient was in a usual state of health until yesterday when family notes that she was increasingly confused, more somnolent.  # Altered mental status, unclear etiology possibly dehydration as she is feeling better after of IV fluids in the emergency department - UA neg, UDS neg (except TCA) - Given recent CVA will pursue an MRI Brain --mentation better. Per caregiver (cousin' wife Leslie Patel)--pt has issues with memory. She sees Dr Lane  Neurology --vital ok   #.  Right lower lobe pulmonary embolism, incidental, pt asymptomatic, sats stbale - Continue heparin infusion - bilateral lower extremity venous Doppler--negative for DVT   --CTA chest  today   #History of CVA - Continue aspirin --MRI brain pending   #.  History of hypertension - Not currently medicated, monitor.   #. History of hyperlipidemia - Continue atorvastatin  Procedures: Family communication :Leslie Patel Consults :none CODE STATUS: DNR DVT Prophylaxis :heparin Level of care: Telemetry Status is: Inpatient Remains inpatient appropriate because: pending CTA chest and MRI Brain    TOTAL TIME TAKING CARE OF THIS PATIENT: 40 minutes.  >50% time spent on counselling and coordination of care  Note: This dictation was prepared with Dragon dictation along with smaller phrase technology. Any transcriptional errors that result from this process are unintentional.  Leslie Patel M.D    [facility]   CC: Primary care physician; [facility]

## 2024-09-07 DIAGNOSIS — R4182 Altered mental status, unspecified: Secondary | ICD-10-CM | POA: Diagnosis not present

## 2024-09-07 LAB — CBC
HCT: 31.8 % — ABNORMAL LOW (ref 36.0–46.0)
Hemoglobin: 10.8 g/dL — ABNORMAL LOW (ref 12.0–15.0)
MCH: 32 pg (ref 26.0–34.0)
MCHC: 34 g/dL (ref 30.0–36.0)
MCV: 94.4 fL (ref 80.0–100.0)
Platelets: 156 K/uL (ref 150–400)
RBC: 3.37 MIL/uL — ABNORMAL LOW (ref 3.87–5.11)
RDW: 13.8 % (ref 11.5–15.5)
WBC: 5.9 K/uL (ref 4.0–10.5)
nRBC: 0 % (ref 0.0–0.2)

## 2024-09-07 MED ORDER — APIXABAN (ELIQUIS) VTE STARTER PACK (10MG AND 5MG): ORAL_TABLET | ORAL | 0 refills | Status: DC

## 2024-09-07 MED ORDER — POTASSIUM CHLORIDE CRYS ER 20 MEQ PO TBCR: 20.0000 meq | EXTENDED_RELEASE_TABLET | Freq: Every day | ORAL | Status: DC

## 2024-09-07 NOTE — TOC Progression Note (Signed)
 Transition of Care [facility]) - Progression Note    Patient Details  Name: Leslie Patel MRN: [id number] Date of Birth: 12/23/1940  [facility] CM/SW Contact  Dalia GORMAN Fuse, RN Phone Number: 09/07/2024, 8:25 AM  Clinical Narrative:     Mentation improved after ivf. Remains on IV Heparin GTT for right lower lobe pulmonary embolism. No TOC needs at this time.                    Expected Discharge Plan and Services                                               Social Drivers of Health (SDOH) Interventions SDOH Screenings   Food Insecurity: No Food Insecurity (09/06/2024)  Housing: Low Risk  (09/06/2024)  Transportation Needs: No Transportation Needs (09/06/2024)  Utilities: Not At Risk (09/06/2024)  Alcohol Screen: Low Risk  (07/15/2020)  Depression (PHQ2-9): Medium Risk (02/03/2021)  Financial Resource Strain: Medium Risk (11/21/2023)   Received from [facility]  Physical Activity: Inactive (07/15/2020)  Social Connections: Socially Isolated (09/06/2024)  Stress: Stress Concern Present (07/15/2020)  Tobacco Use: Medium Risk (09/05/2024)    Readmission Risk Interventions     No data to display

## 2024-09-07 NOTE — Discharge Summary (Addendum)
 Physician Discharge Summary   Patient: Leslie Patel MRN: [id number] DOB: 1941/04/05  Admit date:     09/05/2024  Discharge date: 09/07/24  Discharge Physician: AIDA CHO   PCP: [facility]   Recommendations at discharge:   Follow-up with PCP within 1 week of discharge Check potassium level within 1 week of discharge  Discharge Diagnoses: Principal Problem:   Altered mental status Active Problems:   Acute pulmonary embolism (HCC)  Resolved Problems:   * No resolved hospital problems. [facility] Course:   Leslie Patel is a 83 y.o. 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    Assessment and Plan:   Altered mental status, acute metabolic encephalopathy: Resolved and back to baseline.  According to family [place] and [place]) patient is usually oriented to person at baseline and has underlying cognitive impairment.   Acute bilateral pulmonary embolism: Asymptomatic.  She is tolerating room air.  She will be discharged on Eliquis starter pack.  Family has been advised to obtain refills before she runs out of Eliquis.  Close follow-up with PCP recommended.   History of stroke with mild/speech and left-sided hemiparesis: Discontinue aspirin because she has been started on Eliquis.  This is to reduce the risk of bleeding from dual antithrombotic therapy because of advanced age.  Family is agreeable to the plan.   History of low potassium: She had been on potassium supplement 20 mEq 4 times a day (from admission med rec).  However, potassium went up above normal range and potassium supplement was discontinued.  Potassium is now low normal.  Discussed management of low and high potassium.  For now, patient will be discharged on  potassium chloride  20 mEq daily (lower dose) but she should follow-up with her PCP for repeat potassium within 1 week of discharge.   Comorbidities include hypertension, hyperlipidemia   Discharge plan discussed with Arley (cousin) and Tammie (Gary's wife) who are patient's caregivers.  They said she is at her baseline and they believe she will do better at home.  They also said they have all DME, including the hospital bed, required to take care of her at home.       Consultants: None Procedures performed: None  Disposition: Home health Diet recommendation:  Discharge Diet Orders (From admission, onward)     Start     Ordered   09/07/24 0000  Diet - low sodium heart healthy        09/07/24 1041           Cardiac diet DISCHARGE MEDICATION: Allergies as of 09/07/2024       Reactions   Darvon [propoxyphene] Anaphylaxis   Loratadine Anaphylaxis   Cephalosporins    Codeine    Egg Protein-containing Drug Products    Paroxetine    Penicillins    Sulfa Antibiotics    Xanax  [alprazolam]    Percocet [oxycodone-acetaminophen] Itching, Rash, Other (See Comments)   wheezing        Medication List     STOP taking these medications    aspirin 81 MG chewable tablet       TAKE these medications    Apixaban Starter Pack (10mg  and 5mg ) Commonly known as: ELIQUIS STARTER PACK Take as directed on package: start with two-5mg  tablets twice daily for 7 days. On day  8, switch to one-5mg  tablet twice daily.   atorvastatin 20 MG tablet Commonly known as: LIPITOR Take 20 mg by mouth daily.   latanoprost 0.005 % ophthalmic solution Commonly known as: XALATAN Place 1 drop into both eyes at bedtime.   nortriptyline 10 MG capsule Commonly known as: PAMELOR Take 10-20 mg by mouth at bedtime.   potassium chloride  SA 20 MEQ tablet Commonly known as: KLOR-CON  M Take 1 tablet (20 mEq total) by mouth daily for 5 days. What changed: when to take this        Discharge  Exam: Filed Weights   09/05/24 1523  Weight: 72.6 kg   GEN: NAD SKIN: Warm and dry EYES: No pallor or icterus ENT: MMM CV: RRR PULM: CTA B ABD: soft, ND, NT, +BS CNS: AAO x 1 (person), mild left hemiparesis, mild slurred speech EXT: Mild left leg edema without tenderness or erythema   Condition at discharge: good  The results of significant diagnostics from this hospitalization (including imaging, microbiology, ancillary and laboratory) are listed below for reference.   Imaging Studies: MR BRAIN WO CONTRAST Result Date: 09/06/2024 EXAM: MRI BRAIN WITHOUT CONTRAST 09/06/2024 02:01:44 PM TECHNIQUE: Multiplanar multisequence MRI of the head/brain was performed without the administration of intravenous contrast. COMPARISON: Head CT 09/05/2024. CLINICAL HISTORY: Mental status change, unknown cause. FINDINGS: The examination is moderately motion degraded. BRAIN AND VENTRICLES: There is no evidence of an acute infarct, mass, midline shift, hydrocephalus, or extra axial fluid collection. Chronic infarcts are again noted in the right basal ganglia and deep white matter with associated chronic blood products and ex vacuo dilatation of the right lateral ventricle. Wallerian degeneration extends into the brainstem. There is mild global cerebral atrophy. Major intracranial vascular flow voids are preserved. ORBITS: Bilateral cataract extraction. SINUSES AND MASTOIDS: Trace bilateral mastoid fluid. Clear paranasal sinuses. BONES AND SOFT TISSUES: Normal marrow signal. 9 mm cyst near the right temporomandibular joint. IMPRESSION: 1. No acute intracranial abnormality. 2. Chronic right basal ganglia region infarcts. Electronically signed by: Dasie Hamburg MD 09/06/2024 03:11 PM EST RP Workstation: [id number]   CT Angio Chest Pulmonary Embolism (PE) W or WO Contrast Result Date: 09/06/2024 CLINICAL DATA:  Concern for pulmonary sub. EXAM: CT ANGIOGRAPHY CHEST WITH CONTRAST TECHNIQUE: Multidetector CT imaging of  the chest was performed using the standard protocol during bolus administration of intravenous contrast. Multiplanar CT image reconstructions and MIPs were obtained to evaluate the vascular anatomy. RADIATION DOSE REDUCTION: This exam was performed according to the departmental dose-optimization program which includes automated exposure control, adjustment of the mA and/or kV according to patient size and/or use of iterative reconstruction technique. CONTRAST:  75mL OMNIPAQUE IOHEXOL 350 MG/ML SOLN COMPARISON:  Chest radiograph dated 09/05/2024. FINDINGS: Evaluation is limited due to streak artifact caused by patient's arms. Cardiovascular: There is no cardiomegaly or pericardial effusion. There is 3 vessel coronary vascular calcification. Moderate atherosclerotic calcification of the thoracic aorta. No aneurysmal dilatation. Nonocclusive linear segmental and subsegmental emboli bilaterally, age indeterminate. No CT evidence of right heart straining. Mediastinum/Nodes: No hilar or mediastinal adenopathy. The esophagus and the thyroid  gland are grossly unremarkable. No mediastinal fluid collection. Lungs/Pleura: Trace right pleural effusion. No consolidative changes. There is no pneumothorax. The central airways are patent. Upper Abdomen: No acute abnormality. Musculoskeletal: Degenerative changes of the spine. No acute osseous pathology. Review of the MIP images confirms the above findings. IMPRESSION: 1. Age indeterminate nonocclusive segmental and subsegmental pulmonary artery emboli bilaterally. No CT evidence of right heart straining. 2. Trace right pleural  effusion. 3.  Aortic Atherosclerosis (ICD10-I70.0). These results will be called to the ordering clinician or representative by the Radiologist Assistant, and communication documented in the PACS or [employer]. Electronically Signed   By: Vanetta Chou M.D.   On: 09/06/2024 14:45   US  Venous Img Lower Bilateral (DVT) Result Date:  09/06/2024 CLINICAL DATA:  Possible small pulmonary embolism in the right lower lobe by CT abdomen. EXAM: BILATERAL LOWER EXTREMITY VENOUS DOPPLER ULTRASOUND TECHNIQUE: Gray-scale sonography with graded compression, as well as color Doppler and duplex ultrasound were performed to evaluate the lower extremity deep venous systems from the level of the common femoral vein and including the common femoral, femoral, profunda femoral, popliteal and calf veins including the posterior tibial, peroneal and gastrocnemius veins when visible. The superficial great saphenous vein was also interrogated. Spectral Doppler was utilized to evaluate flow at rest and with distal augmentation maneuvers in the common femoral, femoral and popliteal veins. COMPARISON:  None Available. FINDINGS: RIGHT LOWER EXTREMITY Common Femoral Vein: No evidence of thrombus. Normal compressibility, respiratory phasicity and response to augmentation. Saphenofemoral Junction: No evidence of thrombus. Normal compressibility and flow on color Doppler imaging. Profunda Femoral Vein: No evidence of thrombus. Normal compressibility and flow on color Doppler imaging. Femoral Vein: No evidence of thrombus. Normal compressibility, respiratory phasicity and response to augmentation. Popliteal Vein: No evidence of thrombus. Normal compressibility, respiratory phasicity and response to augmentation. Calf Veins: No evidence of thrombus. Normal compressibility and flow on color Doppler imaging. Superficial Great Saphenous Vein: No evidence of thrombus. Normal compressibility. Venous Reflux:  None. Other Findings: No evidence of superficial thrombophlebitis or abnormal fluid collection. LEFT LOWER EXTREMITY Common Femoral Vein: No evidence of thrombus. Normal compressibility, respiratory phasicity and response to augmentation. Saphenofemoral Junction: No evidence of thrombus. Normal compressibility and flow on color Doppler imaging. Profunda Femoral Vein: No evidence  of thrombus. Normal compressibility and flow on color Doppler imaging. Femoral Vein: No evidence of thrombus. Normal compressibility, respiratory phasicity and response to augmentation. Popliteal Vein: No evidence of thrombus. Normal compressibility, respiratory phasicity and response to augmentation. Calf Veins: No evidence of thrombus. Normal compressibility and flow on color Doppler imaging. Superficial Great Saphenous Vein: No evidence of thrombus. Normal compressibility. Venous Reflux:  None. Other Findings: No evidence of superficial thrombophlebitis or abnormal fluid collection. IMPRESSION: No evidence of deep venous thrombosis in either lower extremity. Electronically Signed   By: Marcey Moan M.D.   On: 09/06/2024 11:16   DG Chest Portable 1 View Result Date: 09/05/2024 CLINICAL DATA:  Altered level of consciousness, confusion EXAM: PORTABLE CHEST 1 VIEW COMPARISON:  11/02/2005 FINDINGS: Single frontal view of the chest demonstrates an unremarkable cardiac silhouette. No acute airspace disease, effusion, or pneumothorax. There are no acute displaced fractures. IMPRESSION: 1. No acute intrathoracic process. Electronically Signed   By: Ozell Daring M.D.   On: 09/05/2024 17:24   CT HEAD WO CONTRAST Result Date: 09/05/2024 EXAM: CT HEAD WITHOUT CONTRAST 09/05/2024 05:02:31 PM TECHNIQUE: CT of the head was performed without the administration of intravenous contrast. Automated exposure control, iterative reconstruction, and/or weight based adjustment of the mA/kV was utilized to reduce the radiation dose to as low as reasonably achievable. COMPARISON: 07/08/2024 CLINICAL HISTORY: Mental status change, unknown cause; ams. FINDINGS: BRAIN AND VENTRICLES: No acute hemorrhage. No evidence of acute infarct. Old right basal ganglia lacunar infarcts, stable. There is atrophy and chronic small vessel disease throughout the deep white matter. No hydrocephalus. No extra-axial collection. No mass effect or  midline  shift. ORBITS: No acute abnormality. SINUSES: No acute abnormality. SOFT TISSUES AND SKULL: No acute soft tissue abnormality. No skull fracture. IMPRESSION: 1. No acute intracranial abnormality. 2. Atrophy and chronic small vessel disease throughout the deep white matter. 3. Old right basal ganglia lacunar infarcts, stable. Electronically signed by: Franky Crease MD 09/05/2024 05:14 PM EST RP Workstation: [id number]   CT ABDOMEN PELVIS W CONTRAST Result Date: 09/05/2024 EXAM: CT ABDOMEN AND PELVIS WITH CONTRAST 09/05/2024 05:02:31 PM TECHNIQUE: CT of the abdomen and pelvis was performed with the administration of 100 mL of iohexol (OMNIPAQUE) 300 MG/ML solution. Multiplanar reformatted images are provided for review. Automated exposure control, iterative reconstruction, and/or weight-based adjustment of the mA/kV was utilized to reduce the radiation dose to as low as reasonably achievable. COMPARISON: None available. CLINICAL HISTORY: lower abdominal pain FINDINGS: LOWER CHEST: Incidentally noted in a right lower lobe pulmonary arterial branch is a filling defect compatible with pulmonary embolus. Calcifications in the visualized right coronary artery. No pleural effusions. LIVER: The liver is unremarkable. GALLBLADDER AND BILE DUCTS: Gallbladder is unremarkable. No biliary ductal dilatation. SPLEEN: No acute abnormality. PANCREAS: No acute abnormality. ADRENAL GLANDS: No acute abnormality. KIDNEYS, URETERS AND BLADDER: No stones in the kidneys or ureters. No hydronephrosis. No perinephric or periureteral stranding. Urinary bladder is unremarkable. GI AND BOWEL: Stomach demonstrates no acute abnormality. There is no bowel obstruction. PERITONEUM AND RETROPERITONEUM: No ascites. No free air. VASCULATURE: Aorta is normal in caliber. Aortic atherosclerosis. LYMPH NODES: No lymphadenopathy. REPRODUCTIVE ORGANS: Prior hysterectomy. BONES AND SOFT TISSUES: No acute osseous abnormality. No focal soft tissue  abnormality. IMPRESSION: 1. Right lower lobe pulmonary arterial branch filling defect compatible with small pulmonary embolus. CTA chest could be performed to assess for additional pulmonary emboli have felt clinically indicated. 2. Aortic atherosclerosis. 3. Calcifications in the visualized right coronary artery. Electronically signed by: Franky Crease MD 09/05/2024 05:13 PM EST RP Workstation: [id number]    Microbiology: Results for orders placed or performed during the hospital encounter of 09/05/24  Resp panel by RT-PCR (RSV, Flu A&B, Covid) Anterior Nasal Swab     Status: None   Collection Time: 09/05/24  4:44 PM   Specimen: Anterior Nasal Swab  Result Value Ref Range Status   SARS Coronavirus 2 by RT PCR NEGATIVE NEGATIVE Final    Comment: (NOTE) SARS-CoV-2 target nucleic acids are NOT DETECTED.  The SARS-CoV-2 RNA is generally detectable in upper respiratory specimens during the acute phase of infection. The lowest concentration of SARS-CoV-2 viral copies this assay can detect is 138 copies/mL. A negative result does not preclude SARS-Cov-2 infection and should not be used as the sole basis for treatment or other patient management decisions. A negative result may occur with  improper specimen collection/handling, submission of specimen other than nasopharyngeal swab, presence of viral mutation(s) within the areas targeted by this assay, and inadequate number of viral copies(<138 copies/mL). A negative result must be combined with clinical observations, patient history, and epidemiological information. The expected result is Negative.  Fact Sheet for Patients:  bloggercourse.com  Fact Sheet for Healthcare Providers:  seriousbroker.it  This test is no t yet approved or cleared by the [place]  FDA and  has been authorized for detection and/or diagnosis of SARS-CoV-2 by FDA under an Emergency Use Authorization (EUA). This EUA  will remain  in effect (meaning this test can be used) for the duration of the COVID-19 declaration under Section 564(b)(1) of the Act, 21 U.S.C.section 360bbb-3(b)(1), unless the authorization is terminated  or revoked  sooner.       Influenza A by PCR NEGATIVE NEGATIVE Final   Influenza B by PCR NEGATIVE NEGATIVE Final    Comment: (NOTE) The Xpert Xpress SARS-CoV-2/FLU/RSV plus assay is intended as an aid in the diagnosis of influenza from Nasopharyngeal swab specimens and should not be used as a sole basis for treatment. Nasal washings and aspirates are unacceptable for Xpert Xpress SARS-CoV-2/FLU/RSV testing.  Fact Sheet for Patients: bloggercourse.com  Fact Sheet for Healthcare Providers: seriousbroker.it  This test is not yet approved or cleared by the [place]  FDA and has been authorized for detection and/or diagnosis of SARS-CoV-2 by FDA under an Emergency Use Authorization (EUA). This EUA will remain in effect (meaning this test can be used) for the duration of the COVID-19 declaration under Section 564(b)(1) of the Act, 21 U.S.C. section 360bbb-3(b)(1), unless the authorization is terminated or revoked.     Resp Syncytial Virus by PCR NEGATIVE NEGATIVE Final    Comment: (NOTE) Fact Sheet for Patients: bloggercourse.com  Fact Sheet for Healthcare Providers: seriousbroker.it  This test is not yet approved or cleared by the [place]  FDA and has been authorized for detection and/or diagnosis of SARS-CoV-2 by FDA under an Emergency Use Authorization (EUA). This EUA will remain in effect (meaning this test can be used) for the duration of the COVID-19 declaration under Section 564(b)(1) of the Act, 21 U.S.C. section 360bbb-3(b)(1), unless the authorization is terminated or revoked.  Performed at [facility], [address] [place], [place]  [address]     Labs: CBC: Recent Labs  Lab 09/05/24 1535 09/06/24 0314 09/07/24 0849  WBC 7.1 7.8 5.9  HGB 12.1 10.2* 10.8*  HCT 37.0 30.5* 31.8*  MCV 96.9 95.6 94.4  PLT 190 153 156   Basic Metabolic Panel: Recent Labs  Lab 09/05/24 1535 09/05/24 2129 09/06/24 0314  NA 139  --  140  K 3.9  --  3.5  CL 103  --  105  CO2 24  --  24  GLUCOSE 159*  --  91  BUN 20  --  17  CREATININE 0.91  --  0.88  CALCIUM 9.4  --  8.6*  MG  --  2.0  --   PHOS  --  3.5  --    Liver Function Tests: Recent Labs  Lab 09/05/24 1535  AST 19  ALT 11  ALKPHOS 104  BILITOT 0.5  PROT 7.8  ALBUMIN 4.0   CBG: No results for input(s): GLUCAP in the last 168 hours.  Discharge time spent: greater than 30 minutes.  Signed: AIDA CHO, MD [facility] 09/07/2024

## 2024-09-22 ENCOUNTER — Emergency Department

## 2024-09-22 ENCOUNTER — Emergency Department
Admission: EM | Admit: 2024-09-22 | Discharge: 2024-09-23 | Disposition: A | Discharge status: Home / Self Care | Attending: Emergency Medicine | Admitting: Emergency Medicine

## 2024-09-22 ENCOUNTER — Other Ambulatory Visit: Payer: Self-pay

## 2024-09-22 DIAGNOSIS — R451 Restlessness and agitation: Secondary | ICD-10-CM | POA: Diagnosis not present

## 2024-09-22 DIAGNOSIS — R4182 Altered mental status, unspecified: Secondary | ICD-10-CM | POA: Diagnosis present

## 2024-09-22 LAB — COMPREHENSIVE METABOLIC PANEL WITH GFR
ALT: 12 U/L (ref 0–44)
AST: 18 U/L (ref 15–41)
Albumin: 4.6 g/dL (ref 3.5–5.0)
Alkaline Phosphatase: 146 U/L — ABNORMAL HIGH (ref 38–126)
Anion gap: 13 (ref 5–15)
BUN: 13 mg/dL (ref 8–23)
CO2: 25 mmol/L (ref 22–32)
Calcium: 9.6 mg/dL (ref 8.9–10.3)
Chloride: 105 mmol/L (ref 98–111)
Creatinine, Ser: 0.93 mg/dL (ref 0.44–1.00)
GFR, Estimated: 60 mL/min — ABNORMAL LOW (ref >60)
Glucose, Bld: 105 mg/dL — ABNORMAL HIGH (ref 70–99)
Potassium: 4.1 mmol/L (ref 3.5–5.1)
Sodium: 143 mmol/L (ref 135–145)
Total Bilirubin: 0.3 mg/dL (ref 0.0–1.2)
Total Protein: 7.2 g/dL (ref 6.5–8.1)

## 2024-09-22 LAB — CBC
HCT: 28.6 % — ABNORMAL LOW (ref 36.0–46.0)
Hemoglobin: 9 g/dL — ABNORMAL LOW (ref 12.0–15.0)
MCH: 31 pg (ref 26.0–34.0)
MCHC: 31.5 g/dL (ref 30.0–36.0)
MCV: 98.6 fL (ref 80.0–100.0)
Platelets: 201 K/uL (ref 150–400)
RBC: 2.9 MIL/uL — ABNORMAL LOW (ref 3.87–5.11)
RDW: 14.6 % (ref 11.5–15.5)
WBC: 6.8 K/uL (ref 4.0–10.5)
nRBC: 0 % (ref 0.0–0.2)

## 2024-09-22 NOTE — ED Provider Notes (Signed)
 [facility] Provider Note    Event Date/Time   First MD Initiated Contact with Patient 09/22/24 2326     (approximate)   History   Altered Mental Status   HPI {Remember to add pertinent medical, surgical, social, and/or OB history to HPI:1} Leslie Patel is a 83 y.o. female  ***       Physical Exam   Triage Vital Signs: ED Triage Vitals [09/22/24 2158]  Encounter Vitals Group     BP (!) 177/86     Girls Systolic BP Percentile      Girls Diastolic BP Percentile      Boys Systolic BP Percentile      Boys Diastolic BP Percentile      Pulse Rate (!) 101     Resp 18     Temp 97.9 F (36.6 C)     Temp Source Oral     SpO2 99 %     Weight 159 lb 13.3 oz (72.5 kg)     Height 5' 3 (1.6 m)     Head Circumference      Peak Flow      Pain Score 0     Pain Loc      Pain Education      Exclude from Growth Chart     Most recent vital signs: Vitals:   09/22/24 2158  BP: (!) 177/86  Pulse: (!) 101  Resp: 18  Temp: 97.9 F (36.6 C)  SpO2: 99%    {Only need to document appropriate and relevant physical exam:1} General: Awake, no distress. *** CV:  Good peripheral perfusion. *** Resp:  Normal effort. *** Abd:  No distention. *** Other:  ***   ED Results / Procedures / Treatments   Labs (all labs ordered are listed, but only abnormal results are displayed) Labs Reviewed  COMPREHENSIVE METABOLIC PANEL WITH GFR - Abnormal; Notable for the following components:      Result Value   Glucose, Bld 105 (*)    Alkaline Phosphatase 146 (*)    GFR, Estimated 60 (*)    All other components within normal limits  CBC - Abnormal; Notable for the following components:   RBC 2.90 (*)    Hemoglobin 9.0 (*)    HCT 28.6 (*)    All other components within normal limits  URINALYSIS, ROUTINE W REFLEX MICROSCOPIC  CBG MONITORING, ED     EKG  ***   RADIOLOGY I independently interpreted and visualized the  CT head. My interpretation: No  [facility] Radiology interpretation:  IMPRESSION:  1. No acute intracranial abnormality.  2. Old right basal ganglia and internal capsule lacunar infarcts.  3. Atrophy and chronic small vessel disease throughout the deep white matter.     PROCEDURES:  Critical Care performed: Yes  CRITICAL CARE Performed by: Guadalupe Eagles   Total critical care time: *** minutes  Critical care time was exclusive of separately billable procedures and treating other patients.  Critical care was necessary to treat or prevent imminent or life-threatening deterioration.  Critical care was time spent personally by me on the following activities: development of treatment plan with patient and/or surrogate as well as nursing, discussions with consultants, evaluation of patient's response to treatment, examination of patient, obtaining history from patient or surrogate, ordering and performing treatments and interventions, ordering and review of laboratory studies, ordering and review of radiographic studies, pulse oximetry and re-evaluation of patient's condition.   Procedures    MEDICATIONS ORDERED IN ED:  Medications - No data to display   IMPRESSION / MDM / ASSESSMENT AND PLAN / ED COURSE  I reviewed the triage vital signs and the nursing notes.                              Differential diagnosis includes, but is not limited to, ***  Patient's presentation is most consistent with {EM COPA:27473}   ***The patient is on the cardiac monitor to evaluate for evidence of arrhythmia and/or significant heart rate changes.  ***      FINAL CLINICAL IMPRESSION(S) / ED DIAGNOSES   Final diagnoses:  None        Rx / DC Orders   ED Discharge Orders     None        Note:  This document was prepared using Dragon voice recognition software and may include unintentional dictation errors.

## 2024-09-22 NOTE — ED Triage Notes (Addendum)
 Pt presents with family for increased confusion. Family states she has had more anger today to which the pt states Yep. I was on the angry side. A&Ox4. Denies Denies medical complaints at this time. Recently started on K+ and blood thinners after a stroke and PE in August.

## 2024-09-23 LAB — URINALYSIS, ROUTINE W REFLEX MICROSCOPIC
Bilirubin Urine: NEGATIVE
Glucose, UA: NEGATIVE mg/dL
Hgb urine dipstick: NEGATIVE
Ketones, ur: NEGATIVE mg/dL
Leukocytes,Ua: NEGATIVE
Nitrite: NEGATIVE
Protein, ur: NEGATIVE mg/dL
Specific Gravity, Urine: 1.025 (ref 1.005–1.030)
pH: 5 (ref 5.0–8.0)

## 2024-11-27 ENCOUNTER — Other Ambulatory Visit: Payer: Self-pay

## 2024-11-27 ENCOUNTER — Inpatient Hospital Stay: Admission: EM | Admit: 2024-11-27 | Discharge: 2024-11-30 | DRG: 377 | Disposition: A | Discharge status: Home Health

## 2024-11-27 DIAGNOSIS — K922 Gastrointestinal hemorrhage, unspecified: Secondary | ICD-10-CM

## 2024-11-27 DIAGNOSIS — D649 Anemia, unspecified: Secondary | ICD-10-CM | POA: Diagnosis present

## 2024-11-27 DIAGNOSIS — R55 Syncope and collapse: Principal | ICD-10-CM

## 2024-11-27 HISTORY — DX: Cerebral infarction, unspecified: I63.9

## 2024-11-27 LAB — COMPREHENSIVE METABOLIC PANEL WITH GFR
ALT: 18 U/L (ref 0–44)
AST: 31 U/L (ref 15–41)
Albumin: 3.8 g/dL (ref 3.5–5.0)
Alkaline Phosphatase: 89 U/L (ref 38–126)
Anion gap: 13 (ref 5–15)
BUN: 27 mg/dL — ABNORMAL HIGH (ref 8–23)
CO2: 25 mmol/L (ref 22–32)
Calcium: 8.8 mg/dL — ABNORMAL LOW (ref 8.9–10.3)
Chloride: 102 mmol/L (ref 98–111)
Creatinine, Ser: 0.82 mg/dL (ref 0.44–1.00)
GFR, Estimated: >60 mL/min
Glucose, Bld: 134 mg/dL — ABNORMAL HIGH (ref 70–99)
Potassium: 3.9 mmol/L (ref 3.5–5.1)
Sodium: 140 mmol/L (ref 135–145)
Total Bilirubin: <0.2 mg/dL (ref 0.0–1.2)
Total Protein: 5.9 g/dL — ABNORMAL LOW (ref 6.5–8.1)

## 2024-11-27 LAB — TYPE AND SCREEN

## 2024-11-27 LAB — CBC WITH DIFFERENTIAL/PLATELET
Abs Immature Granulocytes: 0.09 10*3/uL — ABNORMAL HIGH (ref 0.00–0.07)
Basophils Absolute: 0.1 10*3/uL (ref 0.0–0.1)
Basophils Relative: 1 %
Eosinophils Absolute: 0.2 10*3/uL (ref 0.0–0.5)
Eosinophils Relative: 2 %
HCT: 20.2 % — ABNORMAL LOW (ref 36.0–46.0)
Hemoglobin: 5.9 g/dL — ABNORMAL LOW (ref 12.0–15.0)
Immature Granulocytes: 1 %
Lymphocytes Relative: 42 %
Lymphs Abs: 4.4 10*3/uL — ABNORMAL HIGH (ref 0.7–4.0)
MCH: 27.3 pg (ref 26.0–34.0)
MCHC: 29.2 g/dL — ABNORMAL LOW (ref 30.0–36.0)
MCV: 93.5 fL (ref 80.0–100.0)
Monocytes Absolute: 1.1 10*3/uL — ABNORMAL HIGH (ref 0.1–1.0)
Monocytes Relative: 10 %
Neutro Abs: 4.6 10*3/uL (ref 1.7–7.7)
Neutrophils Relative %: 44 %
Platelets: 282 10*3/uL (ref 150–400)
RBC: 2.16 MIL/uL — ABNORMAL LOW (ref 3.87–5.11)
RDW: 16.1 % — ABNORMAL HIGH (ref 11.5–15.5)
Smear Review: NORMAL
WBC: 10.4 10*3/uL (ref 4.0–10.5)
nRBC: 0 % (ref 0.0–0.2)

## 2024-11-27 LAB — PROTIME-INR
INR: 1.3 — ABNORMAL HIGH (ref 0.8–1.2)
Prothrombin Time: 16.8 s — ABNORMAL HIGH (ref 11.4–15.2)

## 2024-11-27 LAB — D-DIMER, QUANTITATIVE: D-Dimer, Quant: 0.55 ug{FEU}/mL — ABNORMAL HIGH (ref 0.00–0.50)

## 2024-11-27 LAB — IRON AND TIBC
Iron: 22 ug/dL — ABNORMAL LOW (ref 28–170)
Saturation Ratios: 5 % — ABNORMAL LOW (ref 10.4–31.8)
TIBC: 400 ug/dL (ref 250–450)
UIBC: 379 ug/dL

## 2024-11-27 LAB — ABO/RH: ABO/RH(D): O POS

## 2024-11-27 LAB — TROPONIN T, HIGH SENSITIVITY
Troponin T High Sensitivity: 210 ng/L (ref 0–19)
Troponin T High Sensitivity: 194 ng/L (ref 0–19)

## 2024-11-27 LAB — PREPARE RBC (CROSSMATCH)

## 2024-11-27 LAB — TSH: TSH: 6.22 u[IU]/mL — ABNORMAL HIGH (ref 0.350–4.500)

## 2024-11-27 LAB — FERRITIN: Ferritin: 24 ng/mL (ref 11–307)

## 2024-11-27 MED ORDER — PANTOPRAZOLE SODIUM 40 MG IV SOLR
80.0000 mg | Freq: Once | INTRAVENOUS | Status: AC
  Administered 2024-11-27: 80 mg via INTRAVENOUS
  Filled 2024-11-27: qty 20

## 2024-11-27 MED ORDER — SODIUM CHLORIDE 0.9 % IV SOLN
10.0000 mL/h | Freq: Once | INTRAVENOUS | Status: AC
  Administered 2024-11-27: 10 mL/h via INTRAVENOUS

## 2024-11-27 NOTE — H&P (Incomplete)
 " History and Physical    Patient: Leslie Patel FMW:[id number] DOB: [birth year] DOA: 11/27/2024 DOS: the patient was seen and examined on 11/27/2024 PCP: [facility]  Patient coming from: Home  Chief Complaint:  Chief Complaint  Patient presents with   Near Syncope   HPI: Leslie Patel is a 84 y.o. female with medical history significant of   hypertension, dyslipidemia, breast cancer, prior PE on Eliquis , prior CVA with left-sided deficits presents for evaluation after a near syncopal episode.    In the emergency department, she is hemodynamically stable.  Hemoglobin is noted to be 5.9.  Per ED provider, there was small amount of blood on his rectal exam.  Troponin was elevated and flat around 200.  EKG is nonischemic.  He spoke with on-call GI who recommended admission for consult in the morning.   Review of Systems: {ROS_Text:26778} Past Medical History:  Diagnosis Date   Allergy    Bilateral cataracts    Breast cancer (HCC) 09/16/2005   Left breast, 2.1 cm histologic grade 2, pT2,N1 (mic),M0. ER 90%, PR 70%, HER-2/neu not amplified.   Cancer [facility]) 2006   left breast with radiation and chemo   Dyslipidemia    Edema    Head injury 1995   History of breast cancer    History of chemotherapy 2006   History of radiation therapy 2006   Hx of rheumatic fever    Hypertension    Hypokalemia    Insomnia    Low blood potassium    Metabolic syndrome    Osteoporosis    Personal history of chemotherapy    Personal history of radiation therapy    Shingles 2006   Stress at home    Symptomatic menopausal or female climacteric states    Vitamin D  deficiency    Past Surgical History:  Procedure Laterality Date   ABDOMINAL HYSTERECTOMY     BREAST BIOPSY Left 2006   invasive lobular carcinoma   BREAST BIOPSY Right 10/22/2014   Fibrocystic changes, pseudo-angiomatous stromal hyperplasia.   BREAST EXCISIONAL BIOPSY Right 1991   benign   BREAST LUMPECTOMY Left 09/2005    invasive lobular carcinoma clear margins   BREAST SURGERY Left 2006   lumpectomy   CATARACT EXTRACTION, BILATERAL     Left-07/2014 Right-06-25-14   fracture left arm     Repair   PORT-A-CATH REMOVAL  11/2007   PORTACATH PLACEMENT  2006   Social History:  reports that she has quit smoking. Her smoking use included cigarettes. She has a 9 pack-year smoking history. She has never used smokeless tobacco. She reports that she does not drink alcohol and does not use drugs.  Allergies[1]  Family History  Problem Relation Age of Onset   Tuberculosis Mother    Cancer Father        leukemia   Cancer Daughter        Breast   Diabetes Daughter    Rheum arthritis Daughter    Obesity Daughter    Breast cancer Daughter 56   Throat cancer Daughter    Other Brother        spinal meningitis   Kidney failure Granddaughter     Prior to Admission medications  Medication Sig Start Date End Date Taking? Authorizing Provider  albuterol  (VENTOLIN  HFA) 108 (90 Base) MCG/ACT inhaler Inhale 2 puffs into the lungs every 6 (six) hours as needed for wheezing or shortness of breath. 11/23/24 11/23/25 Yes [provider]  apixaban  (ELIQUIS ) 5 MG TABS tablet  Take 5 mg by mouth every 12 (twelve) hours. 10/05/24  Yes [provider]  atorvastatin  (LIPITOR) 20 MG tablet Take 20 mg by mouth daily. 07/06/24  Yes [provider]  azithromycin  (ZITHROMAX ) 250 MG tablet Take 250-500 mg by mouth daily. 11/23/24 11/28/24 Yes [provider]  benzonatate  (TESSALON ) 200 MG capsule Take 200 mg by mouth 3 (three) times daily as needed for cough. 11/23/24 11/30/24 Yes [provider]  latanoprost  (XALATAN ) 0.005 % ophthalmic solution Place 1 drop into both eyes at bedtime.   Yes [provider]  nortriptyline  (PAMELOR ) 10 MG capsule Take 10 mg by mouth at bedtime. 08/08/24  Yes [provider]  predniSONE (DELTASONE) 10 MG tablet Take 10 mg by mouth daily. 11/23/24  Yes  [provider]  risperiDONE  (RISPERDAL ) 0.25 MG tablet Take 0.25-0.5 mg by mouth daily as needed (Agitation). 10/18/24  Yes [provider]  traZODone  (DESYREL ) 50 MG tablet Take 25-50 mg by mouth 3 (three) times daily. Give 25 mg (one-half tablet) by mouth at 4 pm for Sundowning, 25 mg (one-half tablet) at 7 pm and 50 mg (one tablet) at bedtime 11/21/24  Yes [provider]    Physical Exam: Vitals:   11/27/24 2100 11/27/24 2115 11/27/24 2145 11/27/24 2146  BP: (!) 139/57   132/65  Pulse: 92 88 91 91  Resp: (!) 25 (!) 23 18 18   Temp:    97.6 F (36.4 C)  TempSrc:    Oral  SpO2: 99% 99% 99%    *** Data Reviewed:   Labs on Admission: I have personally reviewed following labs and imaging studies  CBC: Recent Labs  Lab 11/27/24 1750  WBC 10.4  NEUTROABS 4.6  HGB 5.9*  HCT 20.2*  MCV 93.5  PLT 282   Basic Metabolic Panel: Recent Labs  Lab 11/27/24 1750  NA 140  K 3.9  CL 102  CO2 25  GLUCOSE 134*  BUN 27*  CREATININE 0.82  CALCIUM  8.8*   GFR: CrCl cannot be calculated (Unknown ideal weight.). Liver Function Tests: Recent Labs  Lab 11/27/24 1750  AST 31  ALT 18  ALKPHOS 89  BILITOT <0.2  PROT 5.9*  ALBUMIN 3.8   No results for input(s): LIPASE, AMYLASE in the last 168 hours. No results for input(s): AMMONIA in the last 168 hours. Coagulation Profile: Recent Labs  Lab 11/27/24 1750  INR 1.3*   Cardiac Enzymes: No results for input(s): CKTOTAL, CKMB, CKMBINDEX, TROPONINI in the last 168 hours. BNP (last 3 results) No results for input(s): PROBNP in the last 8760 hours. HbA1C: No results for input(s): HGBA1C in the last 72 hours. CBG: No results for input(s): GLUCAP in the last 168 hours. Lipid Profile: No results for input(s): CHOL, HDL, LDLCALC, TRIG, CHOLHDL, LDLDIRECT in the last 72 hours. Thyroid  Function Tests: No results for input(s): TSH, T4TOTAL, FREET4, T3FREE,  THYROIDAB in the last 72 hours. Anemia Panel: No results for input(s): VITAMINB12, FOLATE, FERRITIN, TIBC, IRON, RETICCTPCT in the last 72 hours. Urine analysis:    Component Value Date/Time   COLORURINE YELLOW (A) 09/23/2024 0031   APPEARANCEUR HAZY (A) 09/23/2024 0031   LABSPEC 1.025 09/23/2024 0031   PHURINE 5.0 09/23/2024 0031   GLUCOSEU NEGATIVE 09/23/2024 0031   HGBUR NEGATIVE 09/23/2024 0031   BILIRUBINUR NEGATIVE 09/23/2024 0031   BILIRUBINUR negative 09/24/2022 1734   KETONESUR NEGATIVE 09/23/2024 0031   PROTEINUR NEGATIVE 09/23/2024 0031   UROBILINOGEN 0.2 09/24/2022 1734   NITRITE NEGATIVE 09/23/2024 0031  LEUKOCYTESUR NEGATIVE 09/23/2024 0031    Radiological Exams on Admission: No results found.     Assessment and Plan:  Acute on chronic normocytic anemia  - hgb downtrending from 10 in November 2025 to 5.9 today. No obvious blood loss but hemoccult positive in the ED.  - to receive 2 units PRBC.  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     [1]  Allergies Allergen Reactions   Darvon [Propoxyphene] Anaphylaxis   Loratadine Anaphylaxis   Cephalosporins    Codeine    Egg Protein-Containing Drug Products    Paroxetine    Penicillins    Sulfa Antibiotics    Xanax   [Alprazolam]    Percocet [Oxycodone-Acetaminophen ] Itching, Rash and Other (See Comments)    wheezing   "

## 2024-11-27 NOTE — H&P (Incomplete)
 " History and Physical    Patient: Leslie Patel FMW:[id number] DOB: [birth year] DOA: 11/27/2024 DOS: the patient was seen and examined on 11/27/2024 PCP: [facility]  Patient coming from: Home  Chief Complaint:  Chief Complaint  Patient presents with   Near Syncope   HPI: Leslie Patel is a 84 y.o. female with medical history significant of    dyslipidemia, prior PE on Eliquis , prior CVA with left-sided deficits, recent cognitive decline,  presents for evaluation after a possible syncopal episode that occurred today while she was on the toilet.  History as per family member who lives with her and witnessed the event.  Patient was at her baseline health and went to use the restroom earlier today.  While sitting on the toilet, she slumped over and was unresponsive for approximately 30 seconds, and quickly recovered back to her baseline mental status.  Family member notes she would open her eyes and look at her, however she would not speak.  There was no seizure activity noted.  She did have a partial bowel movement.  She has chronic constipation. There have been no bloody or black tarry stools, abdominal pain, or acid reflux.   In the emergency department, she is hemodynamically stable.  Hemoglobin is noted to be 5.9.  Per ED provider, there was small amount of blood on his rectal exam.  Troponin was elevated and flat around 200.  She has not had any chest pain.  EKG is nonischemic.  ED provider spoke with on-call GI who recommended admission for consult in the morning. 2 units of PRBC were ordered and hospitalist consulted for admission.  Family member confirms she is DNR  Review of Systems: Review of Systems  Reason unable to perform ROS: limited 2/2 dementia, ROS per family member.  Constitutional:  Negative for chills, fever and weight loss.  Respiratory:  Negative for cough and shortness of breath.   Cardiovascular:  Negative for chest pain.  Gastrointestinal:  Positive  for constipation. Negative for abdominal pain, blood in stool, diarrhea, melena, nausea and vomiting.  Genitourinary:  Negative for dysuria and frequency.  Skin:  Negative for rash.  Neurological:  Positive for focal weakness (left sided, chronic). Negative for sensory change and speech change.    Past Medical History:  Diagnosis Date   Allergy    Bilateral cataracts    Breast cancer (HCC) 09/16/2005   Left breast, 2.1 cm histologic grade 2, pT2,N1 (mic),M0. ER 90%, PR 70%, HER-2/neu not amplified.   Cancer [facility]) 2006   left breast with radiation and chemo   Dyslipidemia    Edema    Head injury 1995   History of breast cancer    History of chemotherapy 2006   History of radiation therapy 2006   Hx of rheumatic fever    Hypertension    Hypokalemia    Insomnia    Low blood potassium    Metabolic syndrome    Osteoporosis    Personal history of chemotherapy    Personal history of radiation therapy    Shingles 2006   Stress at home    Symptomatic menopausal or female climacteric states    Vitamin D  deficiency    Past Surgical History:  Procedure Laterality Date   ABDOMINAL HYSTERECTOMY     BREAST BIOPSY Left 2006   invasive lobular carcinoma   BREAST BIOPSY Right 10/22/2014   Fibrocystic changes, pseudo-angiomatous stromal hyperplasia.   BREAST EXCISIONAL BIOPSY Right 1991   benign   BREAST LUMPECTOMY  Left 09/2005   invasive lobular carcinoma clear margins   BREAST SURGERY Left 2006   lumpectomy   CATARACT EXTRACTION, BILATERAL     Left-07/2014 Right-06-25-14   fracture left arm     Repair   PORT-A-CATH REMOVAL  11/2007   PORTACATH PLACEMENT  2006   Social History:  reports that she has quit smoking. Her smoking use included cigarettes. She has a 9 pack-year smoking history. She has never used smokeless tobacco. She reports that she does not drink alcohol and does not use drugs.  Allergies[1]  Family History  Problem Relation Age of  Onset   Tuberculosis Mother    Cancer Father        leukemia   Cancer Daughter        Breast   Diabetes Daughter    Rheum arthritis Daughter    Obesity Daughter    Breast cancer Daughter 69   Throat cancer Daughter    Other Brother        spinal meningitis   Kidney failure Granddaughter     Prior to Admission medications  Medication Sig Start Date End Date Taking? Authorizing Provider  albuterol  (VENTOLIN  HFA) 108 (90 Base) MCG/ACT inhaler Inhale 2 puffs into the lungs every 6 (six) hours as needed for wheezing or shortness of breath. 11/23/24 11/23/25 Yes [provider]  apixaban  (ELIQUIS ) 5 MG TABS tablet Take 5 mg by mouth every 12 (twelve) hours. 10/05/24  Yes [provider]  atorvastatin  (LIPITOR) 20 MG tablet Take 20 mg by mouth daily. 07/06/24  Yes [provider]  azithromycin  (ZITHROMAX ) 250 MG tablet Take 250-500 mg by mouth daily. 11/23/24 11/28/24 Yes [provider]  benzonatate  (TESSALON ) 200 MG capsule Take 200 mg by mouth 3 (three) times daily as needed for cough. 11/23/24 11/30/24 Yes [provider]  latanoprost  (XALATAN ) 0.005 % ophthalmic solution Place 1 drop into both eyes at bedtime.   Yes [provider]  nortriptyline  (PAMELOR ) 10 MG capsule Take 10 mg by mouth at bedtime. 08/08/24  Yes [provider]  predniSONE (DELTASONE) 10 MG tablet Take 10 mg by mouth daily. 11/23/24  Yes [provider]  risperiDONE  (RISPERDAL ) 0.25 MG tablet Take 0.25-0.5 mg by mouth daily as needed (Agitation). 10/18/24  Yes [provider]  traZODone  (DESYREL ) 50 MG tablet Take 25-50 mg by mouth 3 (three) times daily. Give 25 mg (one-half tablet) by mouth at 4 pm for Sundowning, 25 mg (one-half tablet) at 7 pm and 50 mg (one tablet) at bedtime 11/21/24  Yes [provider]    Physical Exam: Vitals:   11/27/24 2100 11/27/24 2115 11/27/24 2145 11/27/24 2146  BP: (!) 139/57   132/65  Pulse: 92  88 91 91  Resp: (!) 25 (!) 23 18 18   Temp:    97.6 F (36.4 C)  TempSrc:    Oral  SpO2: 99% 99% 99%    Physical Exam Vitals and nursing note reviewed.  Constitutional:      General: She is not in acute distress.    Appearance: She is not ill-appearing or toxic-appearing.  HENT:     Mouth/Throat:     Mouth: Mucous membranes are moist.  Eyes:     Extraocular Movements: Extraocular movements intact.  Cardiovascular:     Rate and Rhythm: Normal rate and regular rhythm.  Pulmonary:     Effort: Pulmonary effort is normal. No respiratory distress.  Abdominal:     General: There is no distension.  Palpations: Abdomen is soft.     Tenderness: There is abdominal tenderness (in LLQ). There is no guarding or rebound.  Musculoskeletal:     Cervical back: Neck supple.     Right lower leg: No edema.     Left lower leg: No edema.  Skin:    General: Skin is warm and dry.     Capillary Refill: Capillary refill takes less than 2 seconds.     Coloration: Skin is pale.  Neurological:     Mental Status: She is alert. Mental status is at baseline. She is disoriented.     Motor: Weakness (relative weakness in LLE, however +5/5 in b/l upper and lower extremities) present.     Data Reviewed:   Labs on Admission: I have personally reviewed following labs and imaging studies  CBC: Recent Labs  Lab 11/27/24 1750  WBC 10.4  NEUTROABS 4.6  HGB 5.9*  HCT 20.2*  MCV 93.5  PLT 282   Basic Metabolic Panel: Recent Labs  Lab 11/27/24 1750  NA 140  K 3.9  CL 102  CO2 25  GLUCOSE 134*  BUN 27*  CREATININE 0.82  CALCIUM  8.8*   GFR: CrCl cannot be calculated (Unknown ideal weight.). Liver Function Tests: Recent Labs  Lab 11/27/24 1750  AST 31  ALT 18  ALKPHOS 89  BILITOT <0.2  PROT 5.9*  ALBUMIN 3.8   No results for input(s): LIPASE, AMYLASE in the last 168 hours. No results for input(s): AMMONIA in the last 168 hours. Coagulation Profile: Recent Labs  Lab  11/27/24 1750  INR 1.3*   Cardiac Enzymes: No results for input(s): CKTOTAL, CKMB, CKMBINDEX, TROPONINI in the last 168 hours. BNP (last 3 results) No results for input(s): PROBNP in the last 8760 hours. HbA1C: No results for input(s): HGBA1C in the last 72 hours. CBG: No results for input(s): GLUCAP in the last 168 hours. Lipid Profile: No results for input(s): CHOL, HDL, LDLCALC, TRIG, CHOLHDL, LDLDIRECT in the last 72 hours. Thyroid  Function Tests: No results for input(s): TSH, T4TOTAL, FREET4, T3FREE, THYROIDAB in the last 72 hours. Anemia Panel: No results for input(s): VITAMINB12, FOLATE, FERRITIN, TIBC, IRON, RETICCTPCT in the last 72 hours. Urine analysis:    Component Value Date/Time   COLORURINE YELLOW (A) 09/23/2024 0031   APPEARANCEUR HAZY (A) 09/23/2024 0031   LABSPEC 1.025 09/23/2024 0031   PHURINE 5.0 09/23/2024 0031   GLUCOSEU NEGATIVE 09/23/2024 0031   HGBUR NEGATIVE 09/23/2024 0031   BILIRUBINUR NEGATIVE 09/23/2024 0031   BILIRUBINUR negative 09/24/2022 1734   KETONESUR NEGATIVE 09/23/2024 0031   PROTEINUR NEGATIVE 09/23/2024 0031   UROBILINOGEN 0.2 09/24/2022 1734   NITRITE NEGATIVE 09/23/2024 0031   LEUKOCYTESUR NEGATIVE 09/23/2024 0031    Radiological Exams on Admission: No results found.     Assessment and Plan:  Acute on chronic normocytic anemia  - hgb downtrending from 10 in November 2025 to 5.9 today. No obvious blood loss but hemoccult positive in the ED.  - to receive 2 units PRBC.  - iron studies, b12, folate  added on to labs drawn pre-transfusion  - hold eliquis   - protonix   - GI consult in the am, appreciate recommendations  - monitor hgb   Presyncope  - suspect relate to above vs vasovagal. 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        [1] Allergies Allergen Reactions   Darvon [Propoxyphene] Anaphylaxis   Loratadine Anaphylaxis   Cephalosporins    Codeine    Egg Protein-Containing Drug Products    Paroxetine    Penicillins    Sulfa Antibiotics    Xanax  [Alprazolam]    Percocet [Oxycodone-Acetaminophen ] Itching, Rash and Other (See Comments)    wheezing  "

## 2024-11-27 NOTE — ED Provider Notes (Signed)
 "  [facility] Provider Note    Event Date/Time   First MD Initiated Contact with Patient 11/27/24 1742     (approximate)   History   Near Syncope  No notes on file   HPI Leslie Patel is a 84 y.o. female PMH hypertension, dyslipidemia, breast cancer, prior PE on Eliquis , prior CVA with left-sided deficits presents for evaluation after syncopal episode -Per EMS, family witnessed patient have a loss of consciousness while she was on the toilet.  It lasted approximately 30 seconds.  Quickly reoriented afterwards back to her baseline mental state.  No convulsions.  Glucose normal, vital signs stable and route.  Has been conversive with EMS.  EKG sinus rhythm. - Family provides collateral.  State patient had a near syncopal episode which her eyes were still open but she was minimally responsive that lasted 30 seconds to a minute.  This is while she was straining on the toilet.  Not noticed any black or bloody stools.  Last Eliquis  use this morning.  Confirms she did not fall, no associated trauma. -Had otherwise been in her usual state of health     Physical Exam   Triage Vital Signs: BP (!) 147/64 (BP Location: Right Arm)   Pulse 86   Temp 97.6 F (36.4 C) (Oral)   Resp 18   SpO2 100%     Most recent vital signs: Vitals:   11/27/24 1749 11/27/24 1751  BP: (!) 147/64   Pulse: 86   Resp:  18  Temp: 97.6 F (36.4 C)   SpO2: 100%      General: Awake, no distress.  HEENT: Normocephalic, atraumatic CV:  Good peripheral perfusion. RRR, RP 2+ Resp:  Normal effort. CTAB Abd:  No distention. Nontender to deep palpation throughout Rectal (chaperoned):  Dark brown stool, light streaking of blood.  Immediately Hemoccult positive.   ED Results / Procedures / Treatments   Labs (all labs ordered are listed, but only abnormal results are displayed) Labs Reviewed  CBC WITH DIFFERENTIAL/PLATELET - Abnormal; Notable for the following components:       Result Value   RBC 2.16 (*)    Hemoglobin 5.9 (*)    HCT 20.2 (*)    MCHC 29.2 (*)    RDW 16.1 (*)    Lymphs Abs 4.4 (*)    Monocytes Absolute 1.1 (*)    Abs Immature Granulocytes 0.09 (*)    All other components within normal limits  COMPREHENSIVE METABOLIC PANEL WITH GFR - Abnormal; Notable for the following components:   Glucose, Bld 134 (*)    BUN 27 (*)    Calcium  8.8 (*)    Total Protein 5.9 (*)    All other components within normal limits  PROTIME-INR - Abnormal; Notable for the following components:   Prothrombin Time 16.8 (*)    INR 1.3 (*)    All other components within normal limits  D-DIMER, QUANTITATIVE - Abnormal; Notable for the following components:   D-Dimer, Quant 0.55 (*)    All other components within normal limits  TROPONIN T, HIGH SENSITIVITY - Abnormal; Notable for the following components:   Troponin T High Sensitivity 210 (*)    All other components within normal limits  URINALYSIS, ROUTINE W REFLEX MICROSCOPIC  TYPE AND SCREEN  PREPARE RBC (CROSSMATCH)  TYPE AND SCREEN     EKG  Ecg = sinus rhythm, rate 84, no gross ST elevation or depression, some biphasic T waves in inferior and precordial leads,  normal axis, normal intervals.  No clear evidence of ischemia no arrhythmia my interpretation.   RADIOLOGY N/a    PROCEDURES:  Critical Care performed: Yes, see critical care procedure note(s)  .Critical Care  Performed by: Clarine Ozell LABOR, MD Authorized by: Clarine Ozell LABOR, MD   Critical care provider statement:    Critical care time (minutes):  30   Critical care time was exclusive of:  Separately billable procedures and treating other patients   Critical care was necessary to treat or prevent imminent or life-threatening deterioration of the following conditions:  Circulatory failure (anemia requiring transfusion)   Critical care was time spent personally by me on the following activities:  Development of treatment plan with patient or  surrogate, discussions with consultants, evaluation of patient's response to treatment, examination of patient, ordering and review of laboratory studies, ordering and review of radiographic studies, ordering and performing treatments and interventions, pulse oximetry, re-evaluation of patient's condition and review of old charts   I assumed direction of critical care for this patient from another provider in my specialty: no     Care discussed with: admitting provider      MEDICATIONS ORDERED IN ED: Medications  0.9 %  sodium chloride  infusion (10 mL/hr Intravenous New Bag/Given 11/27/24 1850)  pantoprazole  (PROTONIX ) injection 80 mg (80 mg Intravenous Given 11/27/24 1850)     IMPRESSION / MDM / ASSESSMENT AND PLAN / ED COURSE  I reviewed the triage vital signs and the nursing notes.                              DDX/MDM/AP: Differential diagnosis includes, but is not limited to, vasovagal episode, transient arrhythmia, doubt recurrent/worsening PE in this anticoagulated patient, consider underlying electrolyte abnormality or anemia.  Fortunately no history to suggest traumatic injury from LOC/near LOC episode.  Plan: - Labs - EKG - Reassess  Patient's presentation is most consistent with acute presentation with potential threat to life or bodily function.  The patient is on the cardiac monitor to evaluate for evidence of arrhythmia and/or significant heart rate changes.  ED course below.  Labs with new anemia, isolated elevated BUN.  Rectal exam with small amount of blood.  Remains hemodynamically stable here in emergency department.  Treated with Protonix , GI consulted, will eval in a.m. to consider procedural evaluation.  Transfusing 2 units PRBCs, patient and family consented.  Admitting to hospitalist service.  Does have NSTEMI, suspect type II, already anticoagulated.  No clear indication for emergent anticoagulation reversal at this time, will allow washout.   Clinical Course as  of 11/27/24 1901  Tue Nov 27, 2024  1816 CBC with notable drop in hemoglobin compared to 2 months ago, today 5.9, baseline 9-10  No leukopenia.  Platelets normal. [MM]  1844 Rectal exam with dark stool with some mild streaks of blood, Hemoccult positive Paging GI [MM]  1844 Age-adjusted D-dimer unremarkable [MM]  1848 Troponin elevated, suspect likely type II NSTEMI in the setting of profound anemia  EKG nonischemic, no complaints of chest pain  Already on Eliquis   Will trend [MM]  1849 GI paged Hospitalist consult order placed [MM]  1852 CMP with new isolated elevated BUN suggestive of upper GI hemorrhage [MM]  1854 D/w Dr. Maryruth of GI -GI will eval in a.m., suspect likely no intervention tomorrow and allow time for Eliquis  washout -Agrees with Protonix  - agrees w/ admission [MM]    Clinical Course User Index [  MM] Clarine Ozell LABOR, MD     FINAL CLINICAL IMPRESSION(S) / ED DIAGNOSES   Final diagnoses:  Near syncope  Anemia, unspecified type  Gastrointestinal hemorrhage, unspecified gastrointestinal hemorrhage type     Rx / DC Orders   ED Discharge Orders     None        Note:  This document was prepared using Dragon voice recognition software and may include unintentional dictation errors.   Clarine Ozell LABOR, MD 11/27/24 1901  "

## 2024-11-28 DIAGNOSIS — D500 Iron deficiency anemia secondary to blood loss (chronic): Secondary | ICD-10-CM | POA: Diagnosis not present

## 2024-11-28 DIAGNOSIS — Z7901 Long term (current) use of anticoagulants: Secondary | ICD-10-CM | POA: Diagnosis not present

## 2024-11-28 LAB — BASIC METABOLIC PANEL WITH GFR
Anion gap: 12 (ref 5–15)
BUN: 23 mg/dL (ref 8–23)
CO2: 22 mmol/L (ref 22–32)
Calcium: 8.7 mg/dL — ABNORMAL LOW (ref 8.9–10.3)
Chloride: 103 mmol/L (ref 98–111)
Creatinine, Ser: 0.76 mg/dL (ref 0.44–1.00)
GFR, Estimated: >60 mL/min
Glucose, Bld: 110 mg/dL — ABNORMAL HIGH (ref 70–99)
Potassium: 4.2 mmol/L (ref 3.5–5.1)
Sodium: 137 mmol/L (ref 135–145)

## 2024-11-28 LAB — CBC
HCT: 26.2 % — ABNORMAL LOW (ref 36.0–46.0)
Hemoglobin: 8.3 g/dL — ABNORMAL LOW (ref 12.0–15.0)
MCH: 27.6 pg (ref 26.0–34.0)
MCHC: 31.7 g/dL (ref 30.0–36.0)
MCV: 87 fL (ref 80.0–100.0)
Platelets: 240 10*3/uL (ref 150–400)
RBC: 3.01 MIL/uL — ABNORMAL LOW (ref 3.87–5.11)
RDW: 16 % — ABNORMAL HIGH (ref 11.5–15.5)
WBC: 9.1 10*3/uL (ref 4.0–10.5)
nRBC: 0.3 % — ABNORMAL HIGH (ref 0.0–0.2)

## 2024-11-28 LAB — HEMOGLOBIN AND HEMATOCRIT, BLOOD
HCT: 28.1 % — ABNORMAL LOW (ref 36.0–46.0)
Hemoglobin: 8.9 g/dL — ABNORMAL LOW (ref 12.0–15.0)

## 2024-11-28 LAB — VITAMIN B12: Vitamin B-12: 733 pg/mL (ref 180–914)

## 2024-11-28 LAB — FOLATE: Folate: 11.1 ng/mL

## 2024-11-28 MED ORDER — LACTATED RINGERS IV SOLN: INTRAVENOUS | Status: DC

## 2024-11-28 MED ORDER — NORTRIPTYLINE HCL 10 MG PO CAPS
10.0000 mg | ORAL_CAPSULE | Freq: Every day | ORAL | Status: DC
  Administered 2024-11-28 – 2024-11-29 (×3): 10 mg via ORAL
  Filled 2024-11-28 (×4): qty 1

## 2024-11-28 MED ORDER — PANTOPRAZOLE SODIUM 40 MG IV SOLR: 40.0000 mg | INTRAVENOUS | Status: DC

## 2024-11-28 MED ORDER — ACETAMINOPHEN 325 MG PO TABS: 650.0000 mg | ORAL_TABLET | Freq: Four times a day (QID) | ORAL | Status: DC | PRN

## 2024-11-28 MED ORDER — TRAZODONE HCL 50 MG PO TABS
50.0000 mg | ORAL_TABLET | Freq: Three times a day (TID) | ORAL | Status: DC
  Administered 2024-11-28 (×3): 50 mg via ORAL
  Filled 2024-11-28 (×4): qty 1

## 2024-11-28 MED ORDER — RISPERIDONE 0.25 MG PO TABS
0.2500 mg | ORAL_TABLET | Freq: Every day | ORAL | Status: DC
  Administered 2024-11-29 – 2024-11-30 (×2): 0.25 mg via ORAL
  Filled 2024-11-28 (×2): qty 1

## 2024-11-28 MED ORDER — ATORVASTATIN CALCIUM 20 MG PO TABS
20.0000 mg | ORAL_TABLET | Freq: Every day | ORAL | Status: DC
  Administered 2024-11-28 – 2024-11-30 (×2): 20 mg via ORAL
  Filled 2024-11-28 (×3): qty 1

## 2024-11-28 MED ORDER — ALBUTEROL SULFATE (2.5 MG/3ML) 0.083% IN NEBU: 2.5000 mg | INHALATION_SOLUTION | Freq: Four times a day (QID) | RESPIRATORY_TRACT | Status: DC | PRN

## 2024-11-28 MED ORDER — SODIUM CHLORIDE 0.9 % IV SOLN: INTRAVENOUS | Status: AC

## 2024-11-28 MED ORDER — BENZONATATE 100 MG PO CAPS: 200.0000 mg | ORAL_CAPSULE | Freq: Three times a day (TID) | ORAL | Status: DC | PRN

## 2024-11-28 MED ORDER — RISPERIDONE 0.25 MG PO TABS
0.2500 mg | ORAL_TABLET | Freq: Every evening | ORAL | Status: DC | PRN
  Administered 2024-11-28: 0.25 mg via ORAL
  Filled 2024-11-28: qty 1

## 2024-11-28 MED ORDER — RISPERIDONE 0.25 MG PO TABS
0.2500 mg | ORAL_TABLET | Freq: Every day | ORAL | Status: DC | PRN
  Administered 2024-11-28: 0.25 mg via ORAL
  Filled 2024-11-28: qty 2
  Filled 2024-11-28: qty 1

## 2024-11-28 MED ORDER — NA SULFATE-K SULFATE-MG SULF 17.5-3.13-1.6 GM/177ML PO SOLN
1.0000 | Freq: Once | ORAL | Status: AC
  Administered 2024-11-28: 177 mL via ORAL
  Filled 2024-11-28: qty 1

## 2024-11-28 MED ORDER — PANTOPRAZOLE SODIUM 40 MG IV SOLR
40.0000 mg | Freq: Two times a day (BID) | INTRAVENOUS | Status: DC
  Administered 2024-11-28 – 2024-11-30 (×4): 40 mg via INTRAVENOUS
  Filled 2024-11-28 (×5): qty 10

## 2024-11-28 MED ORDER — ACETAMINOPHEN 650 MG RE SUPP: 650.0000 mg | Freq: Four times a day (QID) | RECTAL | Status: DC | PRN

## 2024-11-28 MED ORDER — LATANOPROST 0.005 % OP SOLN
1.0000 [drp] | Freq: Every day | OPHTHALMIC | Status: DC
  Administered 2024-11-28: 1 [drp] via OPHTHALMIC
  Filled 2024-11-28 (×2): qty 2.5

## 2024-11-28 NOTE — Consult Note (Signed)
 "      Rogelia Copping, MD [facility]  [address] [place], [place] [address] Phone: [phone] Fax : [phone]  Consultation  Referring Provider:     Dr. Michele Primary Care Physician:  Cletus Glenn Primary Gastroenterologist: Sampson         Reason for Consultation:     Anemia  Date of Admission:  11/27/2024 Date of Consultation:  11/28/2024         HPI:   Leslie Patel is a 84 y.o. female who came in with near syncope.  The patient has a history of hypertension, hyperlipidemia, breast cancer and a PE on Eliquis .  The patient had a CVA with left-sided deficits and was witnessed by the family to have loss of consciousness while on the toilet.  It was reported to have only lasted about 30 seconds.  The patient it was reported that the patient was straining while on the toilet.  The patient's labs had shown:  Component     Latest Ref Rng 09/07/2024 09/22/2024 11/27/2024 11/28/2024  Hemoglobin     12.0 - 15.0 g/dL 89.1 (L)  9.0 (L)  5.9 (L)  8.3 (L)   HCT     36.0 - 46.0 % 31.8 (L)  28.6 (L)  20.2 (L)  26.2 (L)   MCV     80.0 - 100.0 fL 94.4  98.6  93.5  87.0    Due to the finding of anemia the patient was transfused with blood and a GI consult was called. The patient and her son were in the room in the son-in-law informing that the patient has dementia.  The patient denies any abdominal pain and states that she has been through a lot recently with her admissions for her stroke and her PE.  The patient and family do not report any signs of any black stools or bloody stools or any other signs of blood loss.  Past Medical History:  Diagnosis Date   Allergy    Bilateral cataracts    Breast cancer (HCC) 09/16/2005   Left breast, 2.1 cm histologic grade 2, pT2,N1 (mic),M0. ER 90%, PR 70%, HER-2/neu not amplified.   Cancer [facility]) 2006   left breast with radiation and chemo   Dyslipidemia    Edema    Head injury 1995   History of breast cancer    History of chemotherapy  2006   History of radiation therapy 2006   Hx of rheumatic fever    Hypertension    Hypokalemia    Insomnia    Low blood potassium    Metabolic syndrome    Osteoporosis    Personal history of chemotherapy    Personal history of radiation therapy    Shingles 2006   Stress at home    Symptomatic menopausal or female climacteric states    Vitamin D  deficiency     Past Surgical History:  Procedure Laterality Date   ABDOMINAL HYSTERECTOMY     BREAST BIOPSY Left 2006   invasive lobular carcinoma   BREAST BIOPSY Right 10/22/2014   Fibrocystic changes, pseudo-angiomatous stromal hyperplasia.   BREAST EXCISIONAL BIOPSY Right 1991   benign   BREAST LUMPECTOMY Left 09/2005   invasive lobular carcinoma clear margins   BREAST SURGERY Left 2006   lumpectomy   CATARACT EXTRACTION, BILATERAL     Left-07/2014 Right-06-25-14   fracture left arm     Repair   PORT-A-CATH REMOVAL  11/2007   PORTACATH PLACEMENT  2006    Prior  to Admission medications  Medication Sig Start Date End Date Taking? Authorizing Provider  albuterol  (VENTOLIN  HFA) 108 (90 Base) MCG/ACT inhaler Inhale 2 puffs into the lungs every 6 (six) hours as needed for wheezing or shortness of breath. 11/23/24 11/23/25 Yes [provider]  apixaban  (ELIQUIS ) 5 MG TABS tablet Take 5 mg by mouth every 12 (twelve) hours. 10/05/24  Yes [provider]  atorvastatin  (LIPITOR) 20 MG tablet Take 20 mg by mouth daily. 07/06/24  Yes [provider]  azithromycin  (ZITHROMAX ) 250 MG tablet Take 250-500 mg by mouth daily. 11/23/24 11/28/24 Yes [provider]  benzonatate  (TESSALON ) 200 MG capsule Take 200 mg by mouth 3 (three) times daily as needed for cough. 11/23/24 11/30/24 Yes [provider]  latanoprost  (XALATAN ) 0.005 % ophthalmic solution Place 1 drop into both eyes at bedtime.   Yes [provider]  nortriptyline  (PAMELOR ) 10 MG capsule Take 10 mg by mouth at bedtime. 08/08/24  Yes [provider]  predniSONE (DELTASONE) 10 MG tablet Take 10 mg by mouth daily. 11/23/24  Yes [provider]  risperiDONE  (RISPERDAL ) 0.25 MG tablet Take 0.25-0.5 mg by mouth daily as needed (Agitation). 10/18/24  Yes [provider]  traZODone  (DESYREL ) 50 MG tablet Take 25-50 mg by mouth 3 (three) times daily. Give 25 mg (one-half tablet) by mouth at 4 pm for Sundowning, 25 mg (one-half tablet) at 7 pm and 50 mg (one tablet) at bedtime 11/21/24  Yes [provider]    Family History  Problem Relation Age of Onset   Tuberculosis Mother    Cancer Father        leukemia   Cancer Daughter        Breast   Diabetes Daughter    Rheum arthritis Daughter    Obesity Daughter    Breast cancer Daughter 101   Throat cancer Daughter    Other Brother        spinal meningitis   Kidney failure Granddaughter      Social History[1]  Allergies as of 11/27/2024 - Review Complete 11/27/2024  Allergen Reaction Noted   Darvon [propoxyphene] Anaphylaxis 10/21/2014   Loratadine Anaphylaxis 10/21/2014   Cephalosporins  07/02/2015   Codeine  07/02/2015   Egg protein-containing drug products  07/02/2015   Paroxetine  07/02/2015   Penicillins  07/02/2015   Sulfa antibiotics  07/02/2015   Xanax  [alprazolam]  07/02/2015   Percocet [oxycodone-acetaminophen ] Itching, Rash, and Other (See Comments) 10/21/2014    Review of Systems:    All systems reviewed and negative except where noted in HPI.   Physical Exam:  Vital signs in last 24 hours: Temp:  [97.5 F (36.4 C)-98.4 F (36.9 C)] 98.1 F (36.7 C) (01/28 0958) Pulse Rate:  [86-106] 92 (01/28 0958) Resp:  [16-27] 16 (01/28 0958) BP: (131-157)/(55-77) 138/77 (01/28 0958) SpO2:  [96 %-100 %] 100 % (01/28 0958)   General:   Pleasant, cooperative in NAD Head:  Normocephalic and atraumatic. Eyes:   No icterus.   Conjunctiva pink. PERRLA. Ears:  Normal auditory acuity. Neck:  Supple; no masses or thyroidomegaly Lungs:  Respirations even and unlabored. Lungs clear to auscultation bilaterally.   No wheezes, crackles, or rhonchi.  Heart:  Regular rate and rhythm;  Without murmur, clicks, rubs or gallops Abdomen:  Soft, nondistended, nontender. Normal bowel sounds. No appreciable masses or hepatomegaly.  No rebound or guarding.  Rectal:  Not performed. Msk:  Symmetrical without gross deformities.  Extremities:  Without edema, cyanosis  or clubbing. Neurologic:  Alert left-sided deficit from her stroke Skin:  Intact without significant lesions or rashes. Cervical Nodes:  No significant cervical adenopathy. Psych:  Alert and cooperative. Normal affect.  LAB RESULTS: Recent Labs    11/27/24 1750 11/28/24 0620  WBC 10.4 9.1  HGB 5.9* 8.3*  HCT 20.2* 26.2*  PLT 282 240   BMET Recent Labs    11/27/24 1750 11/28/24 0620  NA 140 137  K 3.9 4.2  CL 102 103  CO2 25 22  GLUCOSE 134* 110*  BUN 27* 23  CREATININE 0.82 0.76  CALCIUM  8.8* 8.7*   LFT Recent Labs    11/27/24 1750  PROT 5.9*  ALBUMIN 3.8  AST 31  ALT 18  ALKPHOS 89  BILITOT <0.2   PT/INR Recent Labs    11/27/24 1750  LABPROT 16.8*  INR 1.3*    STUDIES: No results found.    Impression / Plan:   Assessment: Principal Problem:   Anemia   LEXANY BELKNAP is a 84 y.o. y/o female with profound anemia who has been treated with a blood transfusion.  The patient has been encouraged to undergo a luminal evaluation for the anemia.  The patient has agreed to proceed with a workup.  Plan:  The patient will be set up for an EGD and colonoscopy for tomorrow.  The patient will be started on a clear liquid diet today and be kept n.p.o. after midnight.  The patient's family has been told to encourage her to drink the prep today so that she be clean for tomorrow.  The patient's last dose of Eliquis  was yesterday morning therefore the 2-day washout will be tomorrow afternoon therefore that is when this procedure will be planned.  They  have been explained the procedure and have been encouraged to ask questions and those questions have been answered.  Thank you for involving me in the care of this patient.      LOS: 1 day   Rogelia Copping, MD, MD. NOLIA 11/28/2024, 12:17 PM,  Pager [phone] 7am-5pm  Check AMION for 5pm -7am coverage and on weekends   Note: This dictation was prepared with Dragon dictation along with smaller phrase technology. Any transcriptional errors that result from this process are unintentional.       [1]  Social History Tobacco Use   Smoking status: Former    Current packs/day: 0.30    Average packs/day: 0.3 packs/day for 30.0 years (9.0 ttl pk-yrs)    Types: Cigarettes   Smokeless tobacco: Never   Tobacco comments:    patient said that she has not touched a cigarette in a couple of months  Vaping Use   Vaping status: Never Used  Substance Use Topics   Alcohol use: No    Alcohol/week: 0.0 standard drinks of alcohol   Drug use: No   "

## 2024-11-28 NOTE — Hospital Course (Addendum)
 84 y.o. female with medical history significant of  HPL, Hx PE on Eliquis , Hx CVA with left-sided deficits, recent cognitive decline  presents for evaluation after a possible syncopal episode. Work up in ED showed Hb-5.9, Blood per rectum.  She got admitted and received 2 units of PRBC with improvement in her hemoglobin to 8.  She was placed on IV PPI, n.p.o. seen by GI.  Next day she underwent EGD which showed small hiatal hernia, normal stomach and duodenum.  Later she had colonoscopy which showed no large muscles or active bleeding or old blood.  She had poor bowel prep and stool noted in the entire colon.  Iron -22, TIBC-400, Ferritin-24, BUN 27, INR-1.3 .  She received 1 dose of IV iron  and got placed on oral supplements.She did not had any further syncopal episodes and she was back to her baseline per family.  She worked with physical therapy who recommended to continue home physical therapy which was already arranged by primary care physician.  After discussing with the GI she was placed on Eliquis  with close monitoring of hemoglobin.  Recommend follow-up with GI as outpatient.  Family members were updated about plan of care at the bedside.  All questions answered to satisfaction.

## 2024-11-28 NOTE — Progress Notes (Signed)
" °  Progress Note   Patient: Leslie Patel FMW:[id number] DOB: 1940/11/10 DOA: 11/27/2024     1 DOS: the patient was seen and examined on 11/28/2024   Brief hospital course: 84 y.o. female with medical history significant of  HPL, Hx PE on Eliquis , Hx CVA with left-sided deficits, recent cognitive decline  presents for evaluation after a possible syncopal episode. Work up in ED showed Hb-5.9, Blood per rectum.   Assessment and Plan:  Acute on chronic anemia High risk for bleeding, on eliquis   Hb 5.9 on admission, Baseline around 9-10.  Iron-22, TIBC-400, Ferritin-24, BUN 27, INR-1.3 S/p 2 units of PRBC, Hb 8.3 now  Continue IV PPI bid, Monitor for bleeding, hold eliquis , await GI evaluation   Syncope/presyncope ? Orthostatic hypotension vs VVS vs other  Elevated troponin's likely type 2 MI Elevated TSH TSH-6.22, Trop-210, 194, EKG- non ischemic, D-dimer-0.55 Obtain ECHO for evaluation. Check orthostatics.   Constipation Continue bowel regimen.   Hx PE on Eliquis   Hold eliquis  for now   Hx Embolic CVA with left sided deficit  HPL Cognitive impairment Anxiety/Depression/Insomnia   Continue home meds pamelor , risperdal  and trazadone   Age related physical debility  Obtain PT evaluation.   Code status- DNR/DNI   Discussed with patient and her cousin at the bedside, answered all questions to satisfaction.       Subjective:   Feels alright. Denies any dizziness or LOC since admission No BM today. No new concerns.   She do suffer from constipation.   Physical Exam: Vitals:   11/28/24 0340 11/28/24 0615 11/28/24 0920 11/28/24 0958  BP: (!) 157/67  136/60 138/77  Pulse: (!) 106 91 88 92  Resp: (!) 27 18 (!) 22 16  Temp:   98.4 F (36.9 C) 98.1 F (36.7 C)  TempSrc:   Oral   SpO2: 96%  96% 100%   Gen: No acute distress  Heart - Regular rhythm, No murmurs noted Lungs: Reasonable AE, No added sounds Abd: Soft, NTE, BS present Ext: No edema  Neuro: Chronic LUE  weakness noted    Data Reviewed:  There are no new results to review at this time.  Family Communication: Spoke to cousin at the bedside.   Disposition: Status is: Inpatient Remains inpatient appropriate because: Ongoing evaluation for anemia   Planned Discharge Destination: Home with Home Health    Time spent: 50 minutes  Author: Genell JONELLE Overcast, MD 11/28/2024 10:06 AM  For on call review [email or web address].  "

## 2024-11-28 NOTE — Progress Notes (Signed)
 Patient started Suprep on 1/28, second dose to be given early morning on 1/29.  Patient is having difficulty drinking the prep and needs a lot of encouragement.  First Dose completed.

## 2024-11-29 ENCOUNTER — Inpatient Hospital Stay (HOSPITAL_COMMUNITY): Admit: 2024-11-29 | Discharge: 2024-11-29 | Disposition: A | Discharge status: Home / Self Care

## 2024-11-29 ENCOUNTER — Inpatient Hospital Stay: Admitting: Anesthesiology

## 2024-11-29 ENCOUNTER — Encounter: Payer: Self-pay | Admitting: Emergency Medicine

## 2024-11-29 ENCOUNTER — Encounter: Admission: EM | Disposition: A | Discharge status: Home Health | Payer: Self-pay | Source: Home / Self Care

## 2024-11-29 DIAGNOSIS — D509 Iron deficiency anemia, unspecified: Secondary | ICD-10-CM

## 2024-11-29 DIAGNOSIS — R55 Syncope and collapse: Secondary | ICD-10-CM

## 2024-11-29 DIAGNOSIS — K449 Diaphragmatic hernia without obstruction or gangrene: Secondary | ICD-10-CM

## 2024-11-29 LAB — CBC WITH DIFFERENTIAL/PLATELET
Abs Immature Granulocytes: 0.04 10*3/uL (ref 0.00–0.07)
Basophils Absolute: 0.1 10*3/uL (ref 0.0–0.1)
Basophils Relative: 1 %
Eosinophils Absolute: 0.2 10*3/uL (ref 0.0–0.5)
Eosinophils Relative: 3 %
HCT: 26.8 % — ABNORMAL LOW (ref 36.0–46.0)
Hemoglobin: 8.3 g/dL — ABNORMAL LOW (ref 12.0–15.0)
Immature Granulocytes: 0 %
Lymphocytes Relative: 53 %
Lymphs Abs: 4.8 10*3/uL — ABNORMAL HIGH (ref 0.7–4.0)
MCH: 27.5 pg (ref 26.0–34.0)
MCHC: 31 g/dL (ref 30.0–36.0)
MCV: 88.7 fL (ref 80.0–100.0)
Monocytes Absolute: 0.9 10*3/uL (ref 0.1–1.0)
Monocytes Relative: 10 %
Neutro Abs: 3 10*3/uL (ref 1.7–7.7)
Neutrophils Relative %: 33 %
Platelets: 231 10*3/uL (ref 150–400)
RBC: 3.02 MIL/uL — ABNORMAL LOW (ref 3.87–5.11)
RDW: 16.4 % — ABNORMAL HIGH (ref 11.5–15.5)
Smear Review: NORMAL
WBC: 9 10*3/uL (ref 4.0–10.5)
nRBC: 0 % (ref 0.0–0.2)

## 2024-11-29 LAB — TYPE AND SCREEN
ABO/RH(D): O POS
Antibody Screen: NEGATIVE
Unit division: 0
Unit division: 0

## 2024-11-29 LAB — BASIC METABOLIC PANEL WITH GFR
Anion gap: 10 (ref 5–15)
BUN: 18 mg/dL (ref 8–23)
CO2: 26 mmol/L (ref 22–32)
Calcium: 8.5 mg/dL — ABNORMAL LOW (ref 8.9–10.3)
Chloride: 104 mmol/L (ref 98–111)
Creatinine, Ser: 0.78 mg/dL (ref 0.44–1.00)
GFR, Estimated: >60 mL/min
Glucose, Bld: 96 mg/dL (ref 70–99)
Potassium: 4 mmol/L (ref 3.5–5.1)
Sodium: 140 mmol/L (ref 135–145)

## 2024-11-29 LAB — BPAM RBC
Blood Product Expiration Date: 202602192359
Blood Product Expiration Date: 202602192359
ISSUE DATE / TIME: 202601272124
ISSUE DATE / TIME: 202601280106
Unit Type and Rh: 202602192359
Unit Type and Rh: 5100
Unit Type and Rh: 5100

## 2024-11-29 LAB — ECHOCARDIOGRAM COMPLETE
AR max vel: 1.68 cm2
AV Area VTI: 2.15 cm2
AV Area mean vel: 1.63 cm2
AV Mean grad: 2 mmHg
AV Peak grad: 3.3 mmHg
Ao pk vel: 0.91 m/s
Area-P 1/2: 4.41 cm2
Calc EF: 55.9 %
S' Lateral: 3.9 cm
Single Plane A2C EF: 53.8 %
Single Plane A4C EF: 55.2 %

## 2024-11-29 LAB — T4, FREE: Free T4: 1.11 ng/dL (ref 0.80–2.00)

## 2024-11-29 MED ORDER — TRAZODONE HCL 50 MG PO TABS
50.0000 mg | ORAL_TABLET | Freq: Every day | ORAL | Status: DC
  Filled 2024-11-29: qty 1

## 2024-11-29 MED ORDER — LIDOCAINE HCL (CARDIAC) PF 100 MG/5ML IV SOSY
PREFILLED_SYRINGE | INTRAVENOUS | Status: DC | PRN
  Administered 2024-11-29: 90 mg via INTRAVENOUS

## 2024-11-29 MED ORDER — PROPOFOL 500 MG/50ML IV EMUL
INTRAVENOUS | Status: DC | PRN
  Administered 2024-11-29: 140 ug/kg/min via INTRAVENOUS

## 2024-11-29 MED ORDER — PERFLUTREN LIPID MICROSPHERE
1.0000 mL | INTRAVENOUS | Status: AC | PRN
  Administered 2024-11-29: 2 mL via INTRAVENOUS

## 2024-11-29 MED ORDER — TRAZODONE HCL 50 MG PO TABS: 25.0000 mg | ORAL_TABLET | ORAL | Status: DC

## 2024-11-29 MED ORDER — DEXTROSE-SODIUM CHLORIDE 5-0.9 % IV SOLN: INTRAVENOUS | Status: DC

## 2024-11-29 MED ORDER — PROPOFOL 10 MG/ML IV BOLUS
INTRAVENOUS | Status: DC | PRN
  Administered 2024-11-29: 50 mg via INTRAVENOUS

## 2024-11-29 NOTE — Op Note (Signed)
 [facility] Patient Name: Leslie Patel Procedure Date: 11/29/2024 1:48 PM MRN: [id number] Account #: [id number] Date of Birth: Apr 18, 1941 Admit Type: Inpatient Age: 84 Room: [facility] ENDO ROOM 3 Gender: Female Note Status: Finalized Instrument Name: Colon Scope [id number] Procedure:             Colonoscopy Indications:           Iron deficiency anemia Providers:             Rogelia Copping MD, MD Referring MD:          No Local Md, MD (Referring MD) Medicines:             Propofol  per Anesthesia Complications:         No immediate complications. Procedure:             Pre-Anesthesia Assessment:                        - Prior to the procedure, a History and Physical was                         performed, and patient medications and allergies were                         reviewed. The patient's tolerance of previous                         anesthesia was also reviewed. The risks and benefits                         of the procedure and the sedation options and risks                         were discussed with the patient. All questions were                         answered, and informed consent was obtained. Prior                         Anticoagulants: The patient has taken Eliquis                          (apixaban ), last dose was 2 days prior to procedure.                         ASA Grade Assessment: II - A patient with mild                         systemic disease. After reviewing the risks and                         benefits, the patient was deemed in satisfactory                         condition to undergo the procedure.                        After obtaining informed consent, the colonoscope was  passed under direct vision. Throughout the procedure,                         the patient's blood pressure, pulse, and oxygen                         saturations were monitored continuously. The                         Colonoscope  was introduced through the anus and                         advanced to the the cecum, identified by appendiceal                         orifice and ileocecal valve. The colonoscopy was                         performed without difficulty. The patient tolerated                         the procedure well. The quality of the bowel                         preparation was adequate to identify polyps. Findings:      The perianal and digital rectal examinations were normal.      Liquid stool was found in the entire colon, making visualization       difficult.      No large masses, acive bleeding or old blood seen. Impression:            - Stool in the entire examined colon.                        - No large masses, acive bleeding or old blood seen.                        - No specimens collected. Recommendation:        - Return patient to hospital ward for ongoing care.                        - Resume regular diet.                        - Continue present medications.                        - Outpatient capsule endoscopy since capsule unlikely                         to be completed and tranverse the small bowel with the                         patient laying in bed on her back. Procedure Code(s):     --- Professional ---                        [phone], Colonoscopy, flexible; diagnostic, including  collection of specimen(s) by brushing or washing, when                         performed (separate procedure) Diagnosis Code(s):     --- Professional ---                        D50.9, Iron deficiency anemia, unspecified CPT copyright 2022 American Medical Association. All rights reserved. The codes documented in this report are preliminary and upon coder review may  be revised to meet current compliance requirements. Rogelia Copping MD, MD 11/29/2024 2:42:22 PM This report has been signed electronically. Number of Addenda: 0 Note Initiated On: 11/29/2024 1:48 PM Scope Withdrawal  Time: 0 hours 7 minutes 6 seconds  Total Procedure Duration: 0 hours 27 minutes 20 seconds  Estimated Blood Loss:  Estimated blood loss: none.      [facility]

## 2024-11-29 NOTE — Progress Notes (Signed)
" °  Progress Note   Patient: Leslie Patel FMW:[id number] DOB: 04-20-1941 DOA: 11/27/2024     2 DOS: the patient was seen and examined on 11/29/2024   Brief hospital course: 84 y.o. female with medical history significant of  HPL, Hx PE on Eliquis , Hx CVA with left-sided deficits, recent cognitive decline  presents for evaluation after a possible syncopal episode. Work up in ED showed Hb-5.9, Blood per rectum.   Assessment and Plan:  Acute on chronic anemia High risk for bleeding, on eliquis   Hb 5.9 on admission, Baseline around 9-10.  Iron-22, TIBC-400, Ferritin-24, BUN 27, INR-1.3 S/p 2 units of PRBC, Hb 8.3 today Continue IV PPI bid, Monitor for bleeding, hold eliquis , NPO, IV fluids while NPO Await Endo and colonoscopy later today pending bowel prep.   Syncope/presyncope ? Orthostatic hypotension vs VVS vs other  Elevated troponin's likely type 2 MI Elevated TSH TSH-6.22, Trop-210, 194, EKG- non ischemic, D-dimer-0.55 Await ECHO for evaluation. Continue telemetry   Constipation Continue bowel regimen.   Hx PE on Eliquis   Hold eliquis  for now   Hx Embolic CVA with left sided deficit  HPL Cognitive impairment Anxiety/Depression/Insomnia   Continue home meds pamelor , risperdal  and trazadone   Age related physical debility  PT evaluation.   Code status- DNR/DNI   Discussed with patient and her cousin at the bedside, answered all questions to satisfaction.       Subjective:   Patient sleepy this morning Arousable but falls back to sleep Has been awake last night with multiple bowel movements, only drank half of the prep.   Physical Exam: Vitals:   11/28/24 0958 11/28/24 2100 11/29/24 0358 11/29/24 0751  BP: 138/77 (!) 143/63 (!) 135/57 (!) 133/54  Pulse: 92 96 79 77  Resp: 16 18 18 16   Temp: 98.1 F (36.7 C) 98.5 F (36.9 C) 97.9 F (36.6 C) 97.8 F (36.6 C)  TempSrc:   Oral Axillary  SpO2: 100% 95% 98% 100%   Gen: No acute distress  Heart - Regular  rhythm, No murmurs noted Lungs: Reasonable AE, No added sounds Abd: Soft, NTE, BS present Ext: No edema  Neuro: Chronic LUE weakness noted. Sleepy    Data Reviewed:  There are no new results to review at this time.  Family Communication: Spoke to cousin at the bedside.   Disposition: Status is: Inpatient Remains inpatient appropriate because: Ongoing evaluation for anemia   Planned Discharge Destination: Home with Home Health    Time spent: 50 minutes  Author: Genell JONELLE Overcast, MD 11/29/2024 12:08 PM  For on call review [email or web address].  "

## 2024-11-29 NOTE — Op Note (Signed)
 [facility] Gastroenterology Patient Name: Leslie Patel Procedure Date: 11/29/2024 1:48 PM MRN: [id number] Account #: [id number] Date of Birth: 10-08-1941 Admit Type: Inpatient Age: 84 Room: [facility] ENDO ROOM 3 Gender: Female Note Status: Finalized Instrument Name: Endoscope [id number] Procedure:             Upper GI endoscopy Indications:           Iron deficiency anemia Providers:             Rogelia Copping MD, MD Referring MD:          No Local Md, MD (Referring MD) Medicines:             Propofol  per Anesthesia Complications:         No immediate complications. Procedure:             Pre-Anesthesia Assessment:                        - Prior to the procedure, a History and Physical was                         performed, and patient medications and allergies were                         reviewed. The patient's tolerance of previous                         anesthesia was also reviewed. The risks and benefits                         of the procedure and the sedation options and risks                         were discussed with the patient. All questions were                         answered, and informed consent was obtained. Prior                         Anticoagulants: The patient has taken no anticoagulant                         or antiplatelet agents. ASA Grade Assessment: II - A                         patient with mild systemic disease. After reviewing                         the risks and benefits, the patient was deemed in                         satisfactory condition to undergo the procedure.                        After obtaining informed consent, the endoscope was                         passed under direct vision. Throughout the procedure,  the patient's blood pressure, pulse, and oxygen                         saturations were monitored continuously. The Endoscope                         was introduced through the mouth, and advanced  to the                         third part of duodenum. The upper GI endoscopy was                         accomplished without difficulty. The patient tolerated                         the procedure well. Findings:      A small hiatal hernia was present.      The stomach was normal.      The examined duodenum was normal. Impression:            - Small hiatal hernia.                        - Normal stomach.                        - Normal examined duodenum.                        - No specimens collected. Recommendation:        - Return patient to hospital ward for ongoing care.                        - Resume previous diet.                        - Continue present medications.                        - Perform a colonoscopy today. Procedure Code(s):     --- Professional ---                        [phone], Esophagogastroduodenoscopy, flexible,                         transoral; diagnostic, including collection of                         specimen(s) by brushing or washing, when performed                         (separate procedure) Diagnosis Code(s):     --- Professional ---                        D50.9, Iron deficiency anemia, unspecified CPT copyright 2022 American Medical Association. All rights reserved. The codes documented in this report are preliminary and upon coder review may  be revised to meet current compliance requirements. Rogelia Copping MD, MD 11/29/2024 1:58:44 PM This report has been signed electronically. Number of Addenda: 0 Note Initiated On: 11/29/2024 1:48 PM Estimated Blood  Loss:  Estimated blood loss: none.      [facility]

## 2024-11-29 NOTE — Plan of Care (Signed)

## 2024-11-29 NOTE — Anesthesia Preprocedure Evaluation (Signed)
 "                                  Anesthesia Evaluation  Patient identified by MRN, date of birth, ID band Patient awake and Patient unresponsive    Reviewed: Allergy & Precautions, NPO status , Patient's Chart, lab work & pertinent test results  Airway Mallampati: II  TM Distance: >3 FB Neck ROM: full    Dental  (+) Edentulous Upper, Edentulous Lower, Missing, Loose   Pulmonary neg pulmonary ROS, COPD, former smoker   Pulmonary exam normal  + decreased breath sounds      Cardiovascular Exercise Tolerance: Poor hypertension, Pt. on medications + Peripheral Vascular Disease and + DOE  negative cardio ROS Normal cardiovascular exam Rhythm:Regular Rate:Normal     Neuro/Psych CVA, Residual Symptoms negative neurological ROS  negative psych ROS   GI/Hepatic negative GI ROS, Neg liver ROS,,,  Endo/Other  negative endocrine ROS  Class 3 obesity  Renal/GU negative Renal ROS  negative genitourinary   Musculoskeletal negative musculoskeletal ROS (+)    Abdominal  (+) + obese  Peds negative pediatric ROS (+)  Hematology negative hematology ROS (+) Blood dyscrasia, anemia   Anesthesia Other Findings Past Medical History: No date: Allergy No date: Bilateral cataracts 09/16/2005: Breast cancer (HCC)     Comment:  Left breast, 2.1 cm histologic grade 2, pT2,N1 (mic),M0.              ER 90%, PR 70%, HER-2/neu not amplified. 2006: Cancer [facility])     Comment:  left breast with radiation and chemo No date: Dyslipidemia No date: Edema 1995: Head injury No date: History of breast cancer 2006: History of chemotherapy 2006: History of radiation therapy No date: Hx of rheumatic fever No date: Hypertension No date: Hypokalemia No date: Insomnia No date: Low blood potassium No date: Metabolic syndrome No date: Osteoporosis No date: Personal history of chemotherapy No date: Personal history of radiation therapy 2006: Shingles No date: Stress at home No date:  Stroke [facility]) No date: Symptomatic menopausal or female climacteric states No date: Vitamin D  deficiency  Past Surgical History: No date: ABDOMINAL HYSTERECTOMY 2006: BREAST BIOPSY; Left     Comment:  invasive lobular carcinoma 10/22/2014: BREAST BIOPSY; Right     Comment:  Fibrocystic changes, pseudo-angiomatous stromal               hyperplasia. 1991: BREAST EXCISIONAL BIOPSY; Right     Comment:  benign 09/2005: BREAST LUMPECTOMY; Left     Comment:  invasive lobular carcinoma clear margins 2006: BREAST SURGERY; Left     Comment:  lumpectomy No date: CATARACT EXTRACTION, BILATERAL     Comment:  Left-07/2014 Right-06-25-14 No date: fracture left arm     Comment:  Repair 11/2007: PORT-A-CATH REMOVAL 2006: PORTACATH PLACEMENT     Reproductive/Obstetrics negative OB ROS                              Anesthesia Physical Anesthesia Plan  ASA: 3  Anesthesia Plan: General   Post-op Pain Management:    Induction: Intravenous  PONV Risk Score and Plan: Propofol  infusion and TIVA  Airway Management Planned: Natural Airway and Nasal Cannula  Additional Equipment:   Intra-op Plan:   Post-operative Plan:   Informed Consent: I have reviewed the patients History and Physical, chart, labs and discussed the procedure including the risks, benefits  and alternatives for the proposed anesthesia with the patient or authorized representative who has indicated his/her understanding and acceptance.     Dental Advisory Given  Plan Discussed with: CRNA  Anesthesia Plan Comments:          Anesthesia Quick Evaluation  "

## 2024-11-29 NOTE — Transfer of Care (Signed)
 Immediate Anesthesia Transfer of Care Note  Patient: Leslie Patel  Procedure(s) Performed: EGD (ESOPHAGOGASTRODUODENOSCOPY) COLONOSCOPY  Patient Location: Endoscopy Unit  Anesthesia Type:General  Level of Consciousness: drowsy  Airway & Oxygen Therapy: Patient Spontanous Breathing  Post-op Assessment: Report given to RN and Post -op Vital signs reviewed and stable  Post vital signs: Reviewed and stable  Last Vitals:  Vitals Value Taken Time  BP 119/51 11/29/24 14:37  Temp 35.4 C 11/29/24 14:37  Pulse 83 11/29/24 14:37  Resp 19 11/29/24 14:37  SpO2 100 % 11/29/24 14:37    Last Pain:  Vitals:   11/29/24 1437  TempSrc: Temporal  PainSc: Asleep         Complications: No notable events documented.

## 2024-11-30 ENCOUNTER — Encounter: Payer: Self-pay | Admitting: Gastroenterology

## 2024-11-30 LAB — CBC WITH DIFFERENTIAL/PLATELET
Abs Immature Granulocytes: 0.02 10*3/uL (ref 0.00–0.07)
Basophils Absolute: 0.1 10*3/uL (ref 0.0–0.1)
Basophils Relative: 1 %
Eosinophils Absolute: 0.3 10*3/uL (ref 0.0–0.5)
Eosinophils Relative: 3 %
HCT: 26.2 % — ABNORMAL LOW (ref 36.0–46.0)
Hemoglobin: 7.9 g/dL — ABNORMAL LOW (ref 12.0–15.0)
Immature Granulocytes: 0 %
Lymphocytes Relative: 49 %
Lymphs Abs: 3.8 10*3/uL (ref 0.7–4.0)
MCH: 27.2 pg (ref 26.0–34.0)
MCHC: 30.2 g/dL (ref 30.0–36.0)
MCV: 90.3 fL (ref 80.0–100.0)
Monocytes Absolute: 0.8 10*3/uL (ref 0.1–1.0)
Monocytes Relative: 10 %
Neutro Abs: 2.9 10*3/uL (ref 1.7–7.7)
Neutrophils Relative %: 37 %
Platelets: 212 10*3/uL (ref 150–400)
RBC: 2.9 MIL/uL — ABNORMAL LOW (ref 3.87–5.11)
RDW: 16.1 % — ABNORMAL HIGH (ref 11.5–15.5)
WBC: 7.8 10*3/uL (ref 4.0–10.5)
nRBC: 0 % (ref 0.0–0.2)

## 2024-11-30 LAB — BASIC METABOLIC PANEL WITH GFR
Anion gap: 7 (ref 5–15)
BUN: 13 mg/dL (ref 8–23)
CO2: 25 mmol/L (ref 22–32)
Calcium: 8.1 mg/dL — ABNORMAL LOW (ref 8.9–10.3)
Chloride: 105 mmol/L (ref 98–111)
Creatinine, Ser: 0.75 mg/dL (ref 0.44–1.00)
GFR, Estimated: >60 mL/min
Glucose, Bld: 119 mg/dL — ABNORMAL HIGH (ref 70–99)
Potassium: 3.7 mmol/L (ref 3.5–5.1)
Sodium: 136 mmol/L (ref 135–145)

## 2024-11-30 LAB — T3, FREE: T3, Free: 3 pg/mL (ref 2.0–4.4)

## 2024-11-30 MED ORDER — APIXABAN 5 MG PO TABS: 5.0000 mg | ORAL_TABLET | Freq: Two times a day (BID) | ORAL | Status: DC

## 2024-11-30 MED ORDER — IRON SUCROSE 200 MG IVPB - SIMPLE MED
200.0000 mg | Freq: Once | Status: AC
  Administered 2024-11-30: 200 mg via INTRAVENOUS
  Filled 2024-11-30: qty 200

## 2024-11-30 MED ORDER — POLYETHYLENE GLYCOL 3350 17 G PO PACK: 17.0000 g | PACK | Freq: Every day | ORAL | 0 refills | Status: AC | PRN

## 2024-11-30 MED ORDER — FERROUS GLUCONATE 324 (38 FE) MG PO TABS: 324.0000 mg | ORAL_TABLET | Freq: Every day | ORAL | 0 refills | Status: AC

## 2024-11-30 NOTE — Evaluation (Signed)
 Physical Therapy Evaluation Patient Details Name: Leslie Patel MRN: [id number] DOB: 1940-11-24 Today's Date: 11/30/2024  History of Present Illness  Pt is an 84 y.o. female with medical history significant of dyslipidemia, prior PE on Eliquis , prior CVA with left-sided deficits, recent cognitive decline, presents for evaluation after a possible syncopal episode that occurred while she was on the toilet.  MD assessment includes: acute on chronic anemia, Iron  deficiency anemia, syncope/presyncope, and elevated troponin's likely type 2 MI.   Clinical Impression  Pt was pleasant and motivated to participate during the session and put forth good effort throughout. Pt required physical assist with bed mobility tasks along with cues for sequencing.  Pt was able to perform multiple sit to/from stand transfers and ambulate near the EOB with cues for sequencing but with no physical assist required.  Pt reported no adverse symptoms during the session with SpO2 and HR WNL on room air.  Pt's family/caregivers in room during session and reported that pt is at her baseline level of function at this time and that they wish to take the pt home where she can continue working with HHPT.  Pt will benefit from continued PT services upon discharge to safely address deficits listed in patient problem list for decreased caregiver assistance and eventual return to PLOF.          If plan is discharge home, recommend the following: A lot of help with walking and/or transfers;Assistance with cooking/housework;A lot of help with bathing/dressing/bathroom;Direct supervision/assist for medications management;Assist for transportation;Help with stairs or ramp for entrance   Can travel by private vehicle        Equipment Recommendations None recommended by PT  Recommendations for Other Services       Functional Status Assessment Patient has had a recent decline in their functional status and demonstrates the ability to  make significant improvements in function in a reasonable and predictable amount of time.     Precautions / Restrictions Precautions Precautions: Fall Restrictions Weight Bearing Restrictions Per Provider Order: No      Mobility  Bed Mobility Overal bed mobility: Needs Assistance Bed Mobility: Supine to Sit, Sit to Supine     Supine to sit: Min assist, Mod assist, Used rails Sit to supine: Min assist, Mod assist, Used rails   General bed mobility comments: Min to Mod A for BLE and trunk control with cues for sequencing and use of the bed rail    Transfers Overall transfer level: Needs assistance Equipment used: Rolling walker (2 wheels) Transfers: Sit to/from Stand Sit to Stand: Contact guard assist           General transfer comment: Min to mod verbal cues for sequencing, most notably for foot and hand positioning and increased trunk flexion, with pt able to perform multiple sit to/from stands without physical assist    Ambulation/Gait Ambulation/Gait assistance: Contact guard assist Gait Distance (Feet): 4 Feet Assistive device: Rolling walker (2 wheels) Gait Pattern/deviations: Step-to pattern, Decreased step length - right, Decreased stance time - left, Trunk flexed Gait velocity: decreased     General Gait Details: Pt able to take several small steps at the EOB laterally as well as forwards/backwards with cues for proper sequencing with no physical assist required  Stairs            Wheelchair Mobility     Tilt Bed    Modified Rankin (Stroke Patients Only)       Balance Overall balance assessment: Needs assistance Sitting-balance support: Single  extremity supported Sitting balance-Leahy Scale: Fair     Standing balance support: Bilateral upper extremity supported, During functional activity, Reliant on assistive device for balance Standing balance-Leahy Scale: Fair                               Pertinent Vitals/Pain Pain  Assessment Pain Assessment: No/denies pain    Home Living Family/patient expects to be discharged to:: Private residence Living Arrangements: Other relatives Available Help at Discharge: Family;Available 24 hours/day Type of Home: House Home Access: Ramped entrance       Home Layout: One level Home Equipment: Agricultural [employer]ant (2 wheels);Hospital bed;Wheelchair - manual;Transport chair;Grab bars - toilet Additional Comments: Pt lives with her cousin and his spouse, 24/7 supervision    Prior Function Prior Level of Function : Needs assist             Mobility Comments: At baseline pt performs SPT's to/from w/c and toilet with grab bar next to toilet and with min A, no fall history; currently working with HHPT ADLs Comments: Assist from family with ADLs     Extremity/Trunk Assessment   Upper Extremity Assessment Upper Extremity Assessment: Generalized weakness;LUE deficits/detail LUE Deficits / Details: chronic LUE weakness from prior CVA at baseline per pt/family    Lower Extremity Assessment Lower Extremity Assessment: Generalized weakness;LLE deficits/detail LLE Deficits / Details: chronic LLE weakness from prior CVA at baseline per pt/family       Communication   Communication Communication: No apparent difficulties    Cognition Arousal: Alert Behavior During Therapy: WFL for tasks assessed/performed   PT - Cognitive impairments: No apparent impairments                         Following commands: Intact       Cueing Cueing Techniques: Verbal cues, Tactile cues     General Comments      Exercises     Assessment/Plan    PT Assessment Patient needs continued PT services  PT Problem List Decreased strength;Decreased activity tolerance;Decreased balance;Decreased mobility;Decreased knowledge of use of DME       PT Treatment Interventions DME instruction;Gait training;Functional mobility training;Therapeutic activities;Therapeutic  exercise;Balance training;Neuromuscular re-education;Patient/family education    PT Goals (Current goals can be found in the Care Plan section)  Acute Rehab PT Goals Patient Stated Goal: To return home and continue working with HHPT PT Goal Formulation: With patient Time For Goal Achievement: 12/13/24 Potential to Achieve Goals: Good    Frequency Min 2X/week     Co-evaluation               AM-PAC PT 6 Clicks Mobility  Outcome Measure Help needed turning from your back to your side while in a flat bed without using bedrails?: A Little Help needed moving from lying on your back to sitting on the side of a flat bed without using bedrails?: A Little Help needed moving to and from a bed to a chair (including a wheelchair)?: A Little Help needed standing up from a chair using your arms (e.g., wheelchair or bedside chair)?: A Little Help needed to walk in hospital room?: A Lot Help needed climbing 3-5 steps with a railing? : Total 6 Click Score: 15    End of Session Equipment Utilized During Treatment: Gait belt Activity Tolerance: Patient tolerated treatment well Patient left: in bed;with call bell/phone within reach;with bed alarm set;with family/visitor present Nurse Communication: Mobility  status PT Visit Diagnosis: Difficulty in walking, not elsewhere classified (R26.2);Muscle weakness (generalized) (M62.81);Hemiplegia and hemiparesis Hemiplegia - Right/Left: Left    Time: 8558-8491 PT Time Calculation (min) (ACUTE ONLY): 27 min   Charges:   PT Evaluation $PT Eval Moderate Complexity: 1 Mod PT Treatments $Therapeutic Activity: 8-22 mins PT General Charges $$ ACUTE PT VISIT: 1 Visit    D. Glendia Bertin PT, DPT 11/30/24, 3:53 PM

## 2024-11-30 NOTE — Care Management Important Message (Signed)
 Important Message  Patient Details  Name: ZEMIRA ZEHRING MRN: [id number] Date of Birth: 12-Apr-1941   Important Message Given:  Yes - Medicare IM     Lonnette Shrode 11/30/2024, 2:02 PM

## 2024-11-30 NOTE — Plan of Care (Signed)
  Problem: Education: Goal: Knowledge of General Education information will improve Description: Including pain rating scale, medication(s)/side effects and non-pharmacologic comfort measures Outcome: Not Progressing   Problem: Health Behavior/Discharge Planning: Goal: Ability to manage health-related needs will improve Outcome: Not Progressing   Problem: Clinical Measurements: Goal: Ability to maintain clinical measurements within normal limits will improve Outcome: Progressing Goal: Will remain free from infection Outcome: Progressing Goal: Diagnostic test results will improve Outcome: Progressing Goal: Respiratory complications will improve Outcome: Progressing Goal: Cardiovascular complication will be avoided Outcome: Progressing   Problem: Activity: Goal: Risk for activity intolerance will decrease Outcome: Progressing   Problem: Nutrition: Goal: Adequate nutrition will be maintained Outcome: Progressing   Problem: Coping: Goal: Level of anxiety will decrease Outcome: Progressing   Problem: Elimination: Goal: Will not experience complications related to bowel motility Outcome: Progressing Goal: Will not experience complications related to urinary retention Outcome: Progressing   Problem: Pain Managment: Goal: General experience of comfort will improve and/or be controlled Outcome: Progressing   Problem: Safety: Goal: Ability to remain free from injury will improve Outcome: Progressing   Problem: Skin Integrity: Goal: Risk for impaired skin integrity will decrease Outcome: Progressing

## 2024-11-30 NOTE — Plan of Care (Signed)

## 2024-12-03 NOTE — Anesthesia Postprocedure Evaluation (Signed)
"   Anesthesia Post Note  Patient: ANIRA SENEGAL  Procedure(s) Performed: EGD (ESOPHAGOGASTRODUODENOSCOPY) COLONOSCOPY  Patient location during evaluation: PACU Anesthesia Type: General Level of consciousness: awake Pain management: satisfactory to patient Vital Signs Assessment: post-procedure vital signs reviewed and stable Respiratory status: spontaneous breathing Cardiovascular status: stable Anesthetic complications: no   No notable events documented.   Last Vitals:  Vitals:   11/30/24 0740 11/30/24 1438  BP: (!) 120/59 (!) 135/53  Pulse: 79 87  Resp: 16 18  Temp: 36.7 C 36.7 C  SpO2: 100% 98%    Last Pain:  Vitals:   11/30/24 1438  TempSrc: Oral  PainSc:                  VAN STAVEREN,Ashtin Rosner      "
